# Patient Record
Sex: Female | Born: 1950
Health system: Southern US, Community
[De-identification: ages and names within clinical notes are randomized; demographics above are authoritative.]

## PROBLEM LIST (undated history)

## (undated) DIAGNOSIS — E039 Hypothyroidism, unspecified: Secondary | ICD-10-CM

## (undated) DIAGNOSIS — C569 Malignant neoplasm of unspecified ovary: Secondary | ICD-10-CM

## (undated) DIAGNOSIS — K449 Diaphragmatic hernia without obstruction or gangrene: Secondary | ICD-10-CM

## (undated) DIAGNOSIS — I10 Essential (primary) hypertension: Secondary | ICD-10-CM

## (undated) DIAGNOSIS — E119 Type 2 diabetes mellitus without complications: Secondary | ICD-10-CM

## (undated) DIAGNOSIS — I2699 Other pulmonary embolism without acute cor pulmonale: Secondary | ICD-10-CM

## (undated) DIAGNOSIS — I214 Non-ST elevation (NSTEMI) myocardial infarction: Secondary | ICD-10-CM

## (undated) DIAGNOSIS — J449 Chronic obstructive pulmonary disease, unspecified: Secondary | ICD-10-CM

## (undated) DIAGNOSIS — E782 Mixed hyperlipidemia: Secondary | ICD-10-CM

## (undated) DIAGNOSIS — E663 Overweight: Secondary | ICD-10-CM

## (undated) DIAGNOSIS — I2510 Atherosclerotic heart disease of native coronary artery without angina pectoris: Secondary | ICD-10-CM

## (undated) HISTORY — PX: TONSILLECTOMY: SUR1361

## (undated) HISTORY — DX: Atherosclerotic heart disease of native coronary artery without angina pectoris: I25.10

## (undated) HISTORY — PX: CHOLECYSTECTOMY: SHX55

## (undated) HISTORY — DX: Essential (primary) hypertension: I10

## (undated) HISTORY — PX: OTHER SURGICAL HISTORY: SHX169

## (undated) HISTORY — PX: ABDOMINAL SURGERY: SHX537

## (undated) HISTORY — DX: Type 2 diabetes mellitus without complications: E11.9

## (undated) HISTORY — DX: Hypothyroidism, unspecified: E03.9

## (undated) HISTORY — PX: APPENDECTOMY: SHX54

## (undated) HISTORY — PX: BREAST LUMPECTOMY: SHX2

## (undated) HISTORY — DX: Mixed hyperlipidemia: E78.2

## (undated) HISTORY — DX: Non-ST elevation (NSTEMI) myocardial infarction: I21.4

---

## 1998-07-10 ENCOUNTER — Encounter (INDEPENDENT_AMBULATORY_CARE_PROVIDER_SITE_OTHER): Payer: Self-pay

## 1998-12-10 ENCOUNTER — Encounter (INDEPENDENT_AMBULATORY_CARE_PROVIDER_SITE_OTHER): Payer: Self-pay

## 1998-12-10 ENCOUNTER — Other Ambulatory Visit: Admission: RE | Admit: 1998-12-10 | Discharge: 1998-12-10 | Payer: Self-pay | Admitting: *Deleted

## 1999-02-07 ENCOUNTER — Encounter: Admission: RE | Admit: 1999-02-07 | Discharge: 1999-02-07 | Payer: Self-pay | Admitting: *Deleted

## 1999-02-07 ENCOUNTER — Other Ambulatory Visit: Admission: RE | Admit: 1999-02-07 | Discharge: 1999-02-07 | Payer: Self-pay | Admitting: *Deleted

## 1999-09-01 ENCOUNTER — Ambulatory Visit (HOSPITAL_COMMUNITY): Admission: RE | Admit: 1999-09-01 | Discharge: 1999-09-01 | Payer: Self-pay | Admitting: *Deleted

## 2000-02-17 ENCOUNTER — Other Ambulatory Visit: Admission: RE | Admit: 2000-02-17 | Discharge: 2000-02-17 | Payer: Self-pay | Admitting: *Deleted

## 2000-05-14 ENCOUNTER — Ambulatory Visit (HOSPITAL_COMMUNITY): Admission: RE | Admit: 2000-05-14 | Discharge: 2000-05-14 | Payer: Self-pay | Admitting: *Deleted

## 2001-03-07 ENCOUNTER — Other Ambulatory Visit: Admission: RE | Admit: 2001-03-07 | Discharge: 2001-03-07 | Payer: Self-pay | Admitting: *Deleted

## 2001-10-11 ENCOUNTER — Ambulatory Visit (HOSPITAL_COMMUNITY): Admission: RE | Admit: 2001-10-11 | Discharge: 2001-10-11 | Payer: Self-pay | Admitting: *Deleted

## 2007-12-24 ENCOUNTER — Emergency Department (HOSPITAL_COMMUNITY): Admission: EM | Admit: 2007-12-24 | Discharge: 2007-12-24 | Payer: Self-pay | Admitting: Emergency Medicine

## 2007-12-27 ENCOUNTER — Inpatient Hospital Stay (HOSPITAL_COMMUNITY): Admission: EM | Admit: 2007-12-27 | Discharge: 2008-01-12 | Payer: Self-pay | Admitting: Emergency Medicine

## 2007-12-27 HISTORY — PX: SMALL INTESTINE SURGERY: SHX150

## 2008-01-02 ENCOUNTER — Encounter (INDEPENDENT_AMBULATORY_CARE_PROVIDER_SITE_OTHER): Payer: Self-pay | Admitting: General Surgery

## 2008-01-06 ENCOUNTER — Ambulatory Visit: Payer: Self-pay | Admitting: Oncology

## 2008-01-25 ENCOUNTER — Inpatient Hospital Stay (HOSPITAL_COMMUNITY): Admission: AD | Admit: 2008-01-25 | Discharge: 2008-01-26 | Payer: Self-pay | Admitting: General Surgery

## 2008-04-14 ENCOUNTER — Emergency Department (HOSPITAL_COMMUNITY): Admission: EM | Admit: 2008-04-14 | Discharge: 2008-04-14 | Payer: Self-pay | Admitting: Emergency Medicine

## 2010-05-08 LAB — URINALYSIS, ROUTINE W REFLEX MICROSCOPIC
Bilirubin Urine: NEGATIVE
Glucose, UA: NEGATIVE mg/dL
Ketones, ur: NEGATIVE mg/dL
Nitrite: NEGATIVE
Protein, ur: 30 mg/dL — AB
Specific Gravity, Urine: >1.03 — ABNORMAL HIGH (ref 1.005–1.030)
Urobilinogen, UA: 0.2 mg/dL (ref 0.0–1.0)
pH: 5.5 (ref 5.0–8.0)

## 2010-05-08 LAB — URINE CULTURE: Colony Count: 75000

## 2010-05-08 LAB — URINE MICROSCOPIC-ADD ON

## 2010-05-08 LAB — GLUCOSE, CAPILLARY: Glucose-Capillary: 151 mg/dL — ABNORMAL HIGH (ref 70–99)

## 2010-06-10 NOTE — Group Therapy Note (Signed)
[name], [name]          ACCOUNT NO.:  [id number]   MEDICAL RECORD NO.:  [id number]          PATIENT TYPE:  INP   LOCATION:  IC04                          FACILITY:  [facility]   PHYSICIAN:  Osvaldo Shipper, MD     DATE OF BIRTH:  Dec 14, 1950   DATE OF PROCEDURE:  DATE OF DISCHARGE:                                 PROGRESS NOTE   SUBJECTIVE:  Patient complaining of abdominal pain, 5/10 intensity .  She says that she is still not passing any gas.  Denies any nausea or  vomiting.  No other complaint offered at this time.   OBJECTIVE:  Temperature 98.0, heart rate 100-120 sinus, blood pressure  90/42.  She was hypotensive overnight with the lowest pressures being  74/53, respiratory rate is 20, saturation 100%, I believe she is on 2  liters by nasal cannula.   INS AND OUTS:  She made about 530 mL of urine yesterday, she was  positive by 2250.  NG drainage was 900.  JP drain was 170.  She so far  since midnight has made only 10 mL of urine.  She is positive by 960 mm.   GENERAL EXAM:  This is a morbidly obese white female in no distress.  HEENT:  No pallor, no icterus, oral mucous membranes are moist, no  lesions are noted.  NECK:  Soft, supple.  LUNGS:  Good air entry bilaterally.  No wheezing, rales, rhonchi.  CARDIOVASCULAR:  S1, S2, slightly tachycardic, regular, no murmurs  appreciated.  ABDOMEN:  Obese, tender, she has got bruising bilaterally in the flanks.  Dressing covers majority of the abdomen with the JP drainage.  Bowel  sounds are present at this time.  Abdomen is tender.  LOWER EXTREMITIES:  Do not show any edema.   LABORATORY DATA:  White count is 23,000 with 79% neutrophils, more than  20% bands have been reported, hemoglobin is 10.7, platelet count is 289.  Sodium is 138, potassium is 5.1, chloride is 113, bicarb is 21, glucose  is 164, BUN is 17, creatinine 1.59, albumin is 1.7.  No imaging studies  have been done recently.   ASSESSMENT:  She is status post  surgical repair for small bowel obstruction secondary  to ventral hernia.   1. Hypotension, likely because of hypovolemia, also a component of      sepsis may also be present.  She is getting 150 mL of normal      saline.  She has received 1 unit of blood overnight.  We were      planning to check the CVP but her PICC line is not really a PICC,      it is actually a Midline and she does not have a central access so      CVP cannot be checked at this point.  We will request the IV team      to reposition or replace this Medline into a PICC line.  At that      time we will give her some more fluid boluses.  Her hemoglobin will      be checked during the course  of the day today.  2. Possible sepsis.  She is currently only on Flagyl.  I think      consideration should be given to initiating broad spectrum      antibiotics.  THE PATIENT, HOWEVER, IS ALLERGIC TO PENICILLIN.      Maybe we could put her on Primaxin.  I will discuss this issue with      Dr. Lovell Sheehan.  At the very least I am going to initiate      ciprofloxacin in this individual.  Blood cultures will be sent off      at this time.  3. Acute renal failure.  The patient's UA will be checked.  We will      give aggressive fluids and recheck her renal function tomorrow.  If      she remains oliguric we may have to obtain a nephrology      consultation  4. History of pulmonary embolus.  She was on Coumadin which has been      held for surgery.  She is on full dose Lovenox.  We will have to      watch her carefully for bleeding complications.  5. Type 2 diabetes.  She is currently on a sliding scale which will be      continued, Lantus may have to be considered if her blood sugars      stay high.  6. She has a history of hypertension and she is off her medications at      this time.  7. She is currently on normal saline at 150 mL.  She is n.p.o.  She is      a full code.  8. Anemia: Transfused one unit. Monitor closely. She does  have      bruising in her flanks but low suspiscion for retroperitoneal      bleeding at this time.   Continue ICU setting for now and we will continue to monitor closely and  discuss with the surgeon.      Osvaldo Shipper, MD  Electronically Signed     GK/MEDQ  D:  01/03/2008  T:  01/03/2008  Job:  [id number]

## 2010-06-10 NOTE — Op Note (Signed)
[name], [name]          ACCOUNT NO.:  [id number]   MEDICAL RECORD NO.:  [id number]         PATIENT TYPE:  PINP   LOCATION:  IC04                          FACILITY:  [facility]   PHYSICIAN:  Dalia Heading, M.D.  DATE OF BIRTH:  [birth year]   DATE OF PROCEDURE:  DATE OF DISCHARGE:                               OPERATIVE REPORT   PREOPERATIVE DIAGNOSES:  Bowel obstruction, incisional hernia.   POSTOPERATIVE DIAGNOSES:  Bowel obstruction, incisional hernia.   PROCEDURES:  Partial small-bowel resection, incisional herniorrhaphy  with mesh.   SURGEON:  Dalia Heading, MD   ANESTHESIA:  General endotracheal.   INDICATIONS:  The patient is a 60 year old morbidly obese white female  with multiple medical problems who presents with a bowel obstruction  secondary to an incisional hernia.  This was confirmed by CT scan.  The  patient now comes to the operating room for incisional herniorrhaphy  with mesh.  Risks and benefits of the procedure including bleeding,  infection, cardiopulmonary difficulties, the possibility of recurrence  of the hernia, and death due to anesthesia were fully explained to the  patient, gave informed consent.   PROCEDURE NOTE:  The patient was placed in the supine position.  After  induction of general endotracheal anesthesia, the abdomen was prepped  and draped using usual sterile technique with Betadine.  Surgical site  confirmation was performed.   A right paramedian incision was made at the level of the umbilicus.  This was taken down to the hernia sac.  The hernia sac was excised  without difficulty.  The patient was noted to have both distal small  bowel, the cecum, and ascending colon in the hernia sac.  A stricture of  the small bowel distally was the source of the bowel obstruction.  This  appeared chronic in nature.  It was elected to proceed with a partial  small bowel resection.  Approximately, 8 inches of small bowel was  excised without  difficulty.  There was minimal spillage of small bowel  contents.  A GIA stapler was placed proximally and distally around the  strictured area and fired.  The mesentery was divided using the  LigaSure.  A side-to-side small bowel anastomosis was then performed  using a GIA 70 stapler.  Enterotomy was closed using a TA stapler.  The  staple line was bolstered using 3-0 silk sutures.  Mesenteric defect was  closed using 3-0 silk sutures.  The terminal ileum as well as the colon  were then reduced into the defect.  Surprisingly, the defect measured  approximately 6-8 cm in its greatest diameter.  A 5 cm x 10-cm PROCEED  mesh was then parachuted along the underside of the abdominal wound.  Old Prolene stay sutures were used.  This was done circumferentially.  A  tension-free repair was performed.  In addition, gentamicin and normal  saline were instilled into this region to decrease the chance of  infection.  Operating room personnel changed their gloves prior to the  incisional herniorrhaphy with mesh.  The subcutaneous layer was then  irrigated with normal saline.  A #10 flat Jackson-Pratt drain was placed  into the subcutaneous tissue and brought out through an incision  inferior to the incision line.  It was secured in the skin level using a  3-0 nylon interrupted suture.  Subcutaneous layer was reapproximated  using a 2-0 Vicryl interrupted suture.  The skin was closed using  staples.  Betadine ointment and dry sterile dressing were applied.   All tape and needle counts were correct at the end of procedure.  The  patient was extubated in the operating room and went back to recovery  room in guarded, but stable condition.  She will be transferred to the  intensive care unit for further management and treatment.   COMPLICATIONS:  None.   SPECIMEN:  Partial small bowel, hernia sac.   BLOOD LOSS:  100 mL.   DRAINS:  Jackson-Pratt drain to subcutaneous tissue of wound.      Dalia Heading, M.D.  Electronically Signed     MAJ/MEDQ  D:  01/02/2008  T:  01/03/2008  Job:  [id number]   cc:   Kirk Ruths, M.D.  Fax: [phone]

## 2010-06-10 NOTE — Consult Note (Signed)
[name], [name]          ACCOUNT NO.:  [id number]   MEDICAL RECORD NO.:  [id number]          PATIENT TYPE:  INP   LOCATION:  A330                          FACILITY:  [facility]   PHYSICIAN:  Dalia Heading, M.D.  DATE OF BIRTH:  Jul 17, 1950   DATE OF CONSULTATION:  12/27/2007  DATE OF DISCHARGE:                                 CONSULTATION   REASON FOR CONSULTATION:  Bowel obstruction secondary to incisional  hernia.   HISTORY OF PRESENT ILLNESS:  The patient is a 60 year old morbidly obese  white female with multiple medical problems who presented to the  emergency room with several day history of worsening abdominal pain and  vomiting.  She states she has not had a bowel movement for the past 2  days.  She presented to the emergency room for further evaluation and  treatment.  A CT scan of the abdomen and pelvis was performed, which  revealed small and large bowel loops with in the ventral hernia to the  right of the umbilicus.  No thickening of the bowel wall or stranding  was noted.  Apparently, I have seen the patient in the past as an  outpatient for this hernia, but she was referred to [facility] for further evaluation and treatment.  She was told years ago at  [facility] that she needs to lose weight prior to any hernia repair.  She  has gained weight since that time.   PAST MEDICAL HISTORY:  Morbid obesity, hypertension, diabetes mellitus,  hiatal hernia, hyponatremia, history of ovarian cancer, pulmonary  embolus.   PAST SURGICAL HISTORY:  Exploratory laparotomy, cholecystectomy,  appendectomy, oophorectomy.   CURRENT MEDICATIONS:  Synthroid, Actos, verapamil, Coumadin,  triamterene/hydrochlorothiazide, lisinopril, Nexium, simvastatin.   ALLERGIES:  CODEINE, SULFA, PENICILLIN.   REVIEW OF SYSTEMS:  The patient does not drink or smoke.   PHYSICAL EXAMINATION:  GENERAL:  The patient is a morbidly obese white  female, in no acute distress.  VITAL SIGNS:   She is afebrile and her blood pressure is 100/50, pulse  72, respiration rate 22.  ABDOMEN:  Soft with a soft easily reducible hernia in the right  periumbilical region.  Bowel sounds are heard in this area.  No  hepatosplenomegaly, masses, or rigidity are noted.   LABORATORY DATA:  MET-7 is remarkable for sodium of 124, potassium 3.7,  chloride 82, carbon dioxide 27, glucose 163, BUN 76, creatinine 2.02.  Liver enzyme tests are within normal limits.  White blood cell count  15.4, hematocrit 42.9, platelet count 306.  Her INR is 4.5.   IMPRESSION:  Incisional hernia, which has caused a bowel obstructive  symptoms.  I do not believe it is incarcerated and a surgical  intervention cannot be performed at this time due to her renal  insufficiency, hyponatremia, and INR of 4.5.   PLAN:  Nasogastric tube decompression will be needed.  She will also  need to have all her metabolic abnormalities corrected prior to any  surgical intervention.  She realizes that any surgical intervention is  very risky due to her multiple medical problems including her morbid  obesity.  Further surgical management will be decided as her  abnormalities are corrected.  I will follow the patient with you.  The  patient will be admitted by the hospitalist was for further treatment.      Dalia Heading, M.D.  Electronically Signed     MAJ/MEDQ  D:  12/27/2007  T:  12/28/2007  Job:  [id number]   cc:   Kirk Ruths, M.D.  Fax: [phone]

## 2010-06-10 NOTE — Discharge Summary (Signed)
[name], [name]          ACCOUNT NO.:  [id number]   MEDICAL RECORD NO.:  [id number]          PATIENT TYPE:  INP   LOCATION:  A306                          FACILITY:  [facility]   PHYSICIAN:  Dorris Singh, DO    DATE OF BIRTH:  Mar 20, 1950   DATE OF ADMISSION:  12/27/2007  DATE OF DISCHARGE:  LH                               DISCHARGE SUMMARY   PROBABLE DATE OF DISCHARGE:  January 09, 2008.   ADMISSION DIAGNOSES:  1. Abdominal pain.  2. Small bowel obstruction.  3. Increasing INR.  4. Acute renal failure.  5. Hyponatremia.  6. Morbid obesity.  7. Insulin dependent diabetes.  8. Hypertension.   DISCHARGE DIAGNOSES:  1. Small bowel obstruction secondary to incisional hernia  2. Severe anemia.  3. Acute renal failure.  4. History of pulmonary embolism.  5. Coagulopathy.  6. Type 2 diabetes.  7. Morbid obesity.   CONSULTS THAT WERE MADE:  1. Dalia Heading, M.D. of Surgery.  2. Ladona Horns. Mariel Sleet, M.D. of Hematology/Oncology.  3. Physical therapy.  4. Diabetes management.  5. Social services.   TESTING THAT WAS DONE:  Radiology testing includes:  On December 1 she had a CT of the abdomen without contrast, which  demonstrated distal small bowel proximal colonic obstruction secondary  to large right umbilical hernia.  No evidence for bowel perforation or a  pelvic abscess.  1. December 2 she had a chest x-ray which showed malposition right      PICC line extending into right internal jugular vein, advanced      enteric tip of the tube which is still in the distal thoracic      esophagus.  Greater left than right bibasilar atelectasis.  2. On the 8th she had a chest x-ray which showed cardiomegaly with      bibasilar atelectasis and pulmonary hypoinflation.   HISTORY OF PRESENT ILLNESS:  For H and P please refer to her H and P  regarding that.   HOSPITAL COURSE:  1. For her above diagnosis of small bowel obstruction.  The patient      was admitted.  An NG tube  was placed.  However, this did not      relieve her symptoms.  It was determined that she probably had      incarcerated bowel from an incisional hernia and she therefore had      a hernia repair.  The patient was placed in the ICU and a J tube      was inserted.  She continued to do well.  Her incision healed      nicely without any complications from the surgery.  2. Anemia, requiring blood transfusion.  While the patient was here      she had severe anemia and required four units of blood.  Did not      find a source of her bleeding.  There was some concern as to      whether or not she had a coagulopathy.  Also the patient has had to      be placed on anticoagulation therapy due to history of  pulmonary      embolus.  At this point in time it was determined that Dr.      Mariel Sleet could see the patient regarding a possible coagulopathy.      He also determined as soon as it was safe to place the patient on      anticoagulation, that she needed to be on it as well.  While the      patient was hospitalized she also received a PICC line.  When she      first arrived, she did have acute renal failure.  She was given IV      hydration and this resolved.  Also she has type 2 diabetes.  She was placed on a sliding scale and also diabetes management saw her  for any recommendations regarding treatment.  1. Morbid obesity.  The patient was seen by physical therapy to help      with ambulation and the patient has done well.   Her medications that she will be sent to the rehab facility on include:  1. Sliding scale insulin per their protocol.  2. Beneprotein powder 41 p.o. t.i.d.  3. It is recommended that she stay on Lovenox.  4. Also the addition of Coumadin.  Currently she is on 7.5 of Coumadin      and Lovenox 120.  However, this can probably be increased to reach      a therapeutic level.  5. Protonix 40 mg p.o. daily.  6. Nu-Iron 150 mg p.o. daily.  7. Nystatin 5 mL swish and swallow  every 6 hours.  8. Levothyroxine 100 mcg 1 p.o. daily.  9. Magic mouth wash 5 mL q.i.d. swish and swallow.  Do not give within      2 hours of Cipro or Avelox.  10.Zofran 4 mg IV p.o. every 8 hours p.r.n.  11.Albuterol nebulizer 2.5 mg every 4 hours p.r.n.  12.Tylenol 650 mg p.o. PR every 4 hours p.r.n.  13.Fentanyl citrate 50 mcg IV every 4 hours for pain.  14.Darvocet N 100 one-two tablets p.o. every 4 hours p.r.n. pain.  15.Nystatin cream.  Apply to perineum as directed.  16.Acyclovir ointment.  Apply 6 times a day for 7 days.  17.Chloraseptic spray 2 sprays every 1 hour p.r.n.   It is recommended that the patient's weight be monitored within 5-7 days  after admission to the rehabilitation facility and have them adjust her  Coumadin and Lovenox.  Also she should have daily INRs done until she is  considered to be therapeutic again and a CBC every 3 days to monitor her  blood level until she is therapeutic and is stable.   DISCHARGE ACTIVITIES:  Her condition to go to the he nursing home is  stable.   DISPOSITION:  Will be to a rehabilitation facility as soon as we get a  bed.      Dorris Singh, DO  Electronically Signed     CB/MEDQ  D:  01/10/2008  T:  01/10/2008  Job:  [id number]

## 2010-06-10 NOTE — Group Therapy Note (Signed)
NAMEMARDA, BREIDENBACH          ACCOUNT NO.:  [id number]   MEDICAL RECORD NO.:  [id number]          PATIENT TYPE:  INP   LOCATION:  A306                          FACILITY:  [facility]   PHYSICIAN:  Dorris Singh, DO    DATE OF BIRTH:  Nov 04, 1950   DATE OF PROCEDURE:  01/09/2008  DATE OF DISCHARGE:                                 PROGRESS NOTE   The patient seen today resting in bed comfortably, has no complaints.  Currently we are awaiting for rehab placement.  She is request the [facility], however it has been discussed with her that it depends on where  her insurance will allow her to go and where they can find a bed.   VITALS:  For today are as follows temperature 98, pulse 97, respirations  18, blood pressure 138/68.  GENERAL:  The patient is a morbidly obese Caucasian female who is well-  developed and is in no acute distress.  HEART:  Regular rate and rhythm though breath sounds are distant.  LUNGS:  Clear to auscultation bilaterally.  ABDOMEN:  Soft, nontender, nondistended with appropriate tenderness  around incision site, is large and pendulous.  EXTREMITIES:  Positive  pulses.   LABS:  For today white count 9.1, hemoglobin 9.7, hematocrit 29.6,  platelet count of 278.   ASSESSMENT:  1. Status post small bowel obstruction with umbilical hernia repair.  2. Acute blood loss anemia.  3. History of pulmonary emboli.  4. Anticoagulation therapy due to #3.  5. Morbid obesity.  6. Acute renal failure which is resolved.  7. Type 2 diabetes, we should keep her on her sliding scale.   PLAN:  We are waiting for patient to have placement.  She has recovered  well from her surgery.  She has had her J-tube pulled out.  For her  blood loss anemia we are continuing to monitor this, she received 1 unit  last night and will continue to monitor for any changes.  For her  diabetes we are continuing to monitor that as well.  She also has Herpes  labialis, gave her some ointment for  that which seems to have improved,  we will continue to monitor that. The plan is to have patient possibly  discharged to rehab facility within the next 24 - 72 hours.      Dorris Singh, DO  Electronically Signed     CB/MEDQ  D:  01/09/2008  T:  01/09/2008  Job:  [id number]

## 2010-06-10 NOTE — H&P (Signed)
[name], [name]          ACCOUNT NO.:  [id number]   MEDICAL RECORD NO.:  [id number]          PATIENT TYPE:  INP   LOCATION:  A330                          FACILITY:  [facility]   PHYSICIAN:  Dorris Singh, DO    DATE OF BIRTH:  [birth year]   DATE OF ADMISSION:  12/27/2007  DATE OF DISCHARGE:  LH                              HISTORY & PHYSICAL   The patient is a 60 year old Caucasian female who presented to the [facility] emergency room complaining of abdominal pain.  Her primary care  physician is Dr. Regino Schultze.  She states that it started about 1 week ago.  She has not had a bowel movement and has not been able to eat well.  She  has been reduced to eating Jello.  She says the pain waxes and wanes.  She says she feels like she has to have a bowel movement but cannot have  one.  She also has a right chronic abdominal wall hernia and over the  last 2 days has noticed that she has not been able to keep anything down  and is actually vomiting what she is eating.  She denies fever,  shortness of breath or chest pain.  She is able to urinate.  She said  the pain was gradual and it was worsening.  It was located in her  abdomen which radiated to the right side, characterized as crampy and  dull.  It was aggravated by eating and it was relieved by nothing, and  it has been associated with constipation, nausea and vomiting.   PAST MEDICAL HISTORY:  1. Morbid obesity.  2. Hypertension.  3. Diabetes.  4. GERD.  5. Hiatal hernia.  6. Hypercholesterolemia.  7. Hyponatremia.  8. Ovarian cancer.  9. Pulmonary emboli.   PAST SURGICAL HISTORY:  1. Appendectomy.  2. Cesarean section.  3. Cholecystectomy.  4. Oophorectomy.   SOCIAL HISTORY:  She is a nonsmoker, nondrinker.  No drug abuse.  Lives  with spouse.  She is currently going through menopause and she has  impaired mobility.   ALLERGIES:  1. CODEINE.  2. SULFA.  3. PENICILLIN.   HOME MEDICATIONS:  1. Synthroid 100 mcg  once a day.  2. Actos 45 mg once a day.  3. Verapamil 240 mg once a day.  4. Coumadin 5 mg, specialized dosing.  5. Triamterene/hydrochlorothiazide 50/25 once a day.  6. Lisinopril 20 mg once a day.  7. Nexium 40 mg once a day.  8. Simvastatin 40 mg once a day.   REVIEW OF SYSTEMS:  CONSTITUTIONAL:  The patient denies weight loss.  Positive changes in appetite.  Negative for fever or dizziness.  EYES:  Negative for changes in vision.  EARS/NOSE/MOUTH/THROAT:  Ears negative  for ear pain.  No changes in hearing, sore throat or rhinorrhea.  CARDIOVASCULAR:  Negative chest pain or palpitations.  RESPIRATORY:  Negative for cough, dyspnea or wheezing.  GASTROINTESTINAL:  Positive  for vomiting, abdominal pain and constipation.  Negative for diarrhea or  blood in stool.  GU:  Negative for dysuria and hematuria.  MUSCULOSKELETAL:  Negative for arthralgias, back pain,  myalgias and neck  pain.  SKIN:  Negative for rash or abrasions.  NEURO:  Negative for  headache, altered mental status or weakness.  PSYCHIATRIC:  Negative for  depression or anxiety.   PHYSICAL EXAMINATION:  VITAL SIGNS:  Blood pressure 91/50, pulse rate  72, respirations 22, temperature 97.8.  Satting at 100.  GENERAL:  The  patient is a morbidly obese Caucasian female who is in no acute  distress.  HEENT:  Head is normocephalic, atraumatic.  Eyes; PERRLA.  EOMI.  There  is no scleral icterus or conjunctival injection.  Ears; TMs visualized  bilaterally.  Nose; turbinates are moist.  Throat; there is no erythema  or exudate noted.  Teeth are in fair repair.  NECK:  Supple.  There is no lymphadenopathy noted.  HEART:  Distant heart sounds.  Regular rate and rhythm.  LUNGS:  Clear to auscultation bilaterally.  No rales, wheezes or  rhonchi.  Hard to hear posterior side of chest due to the patient's  immobility.  ABDOMEN:  Soft with some right upper quadrant tenderness.  Abdomen is  pendulous as well.  There is a right  abdominal wall hernia which is  reducible at the time my exam.  EXTREMITIES:  Positive pulses.  No ecchymosis, edema or cyanosis.  SKIN:  Good turgor, good texture.   Urine testing that was done, urine is negative.  CT pelvis without  contrast media shows distal small bowel proximal colonic obstruction  secondary to probable incarcerated large right paraumbilical hernia.  No  evidence of bowel perforation or intra-abdominal abscesses.  The solid  parenchymal organs appeared unremarkable as imaged in the noncontrast  state.  Pelvis; there is no evidence of bowel perforation or pelvic  abscess and basically the distal small bowel proximal colonic  obstruction secondary to large right umbilical hernia.  Her lipase was  52.  Sodium is 124, potassium 3.7, chloride 82, carbon dioxide 27,  glucose 163, BUN 78, creatinine is 2.02 and BUN of 76.  Her INR is 4.5.  Her white count of 15.4, hemoglobin is 14.3, hematocrit 42.9, platelet  count of 306.   ASSESSMENT/PLAN:  1. Abdominal pain.  2. Small bowel obstruction.  3. Elevated increasing international normalized ratio which is      supratherapeutic.  4. Acute renal failure.  5. Hyponatremia.  6. Morbid obesity.  7. Diabetes.  8. Hypertension.   Plan will be to admit the patient to the service of [facility].  She had  a Foley placed in the ED.  Will do strict Is and Os.  Will get blood  work.  Due to the patient's morbidly obese body habitus will probably  order a PICC line for her.  Currently, Dr. Lovell Sheehan has seen the patient  in the ED and has put an NG tube down.  Will continue with a  intermittent suction.  To address all of her other morbidities, will  place her on IV antibiotics to address her white count which will  include Flagyl and Cipro.  Also will place her on IV antihypertensives.  Will do Lopressor 5 mg q.4 h., for systolic above 160.  Also for  hyponatremia, she with get some fluids.  Will see if that corrects and  it  should improve her acute renal failure as well.  Will continue to  monitor the patient.  Will do DVT and GI prophylaxis which I will have  pharmacy to monitor and also place her on a hypokalemic scale.  Will  continue to monitor her progress and change therapy as necessary.      Dorris Singh, DO  Electronically Signed     CB/MEDQ  D:  12/28/2007  T:  12/28/2007  Job:  [id number]   cc:   Kirk Ruths, M.D.  Fax: [phone]

## 2010-06-10 NOTE — Op Note (Signed)
NAMEDESSIRE, GRIMES          ACCOUNT NO.:  [id number]   MEDICAL RECORD NO.:  [id number]          PATIENT TYPE:  INP   LOCATION:  A340                          FACILITY:  [facility]   PHYSICIAN:  Dalia Heading, M.D.  DATE OF BIRTH:  11/08/1950   DATE OF PROCEDURE:  01/26/2008  DATE OF DISCHARGE:  01/26/2008                               OPERATIVE REPORT   PREOPERATIVE DIAGNOSIS:  Abdominal wound dehiscence.   POSTOPERATIVE DIAGNOSIS:  Abdominal wound dehiscence.   PROCEDURE:  Debridement and closure of abdominal wound.   SURGEON:  Dalia Heading, MD   ASSISTANT:  Tilford Pillar, MD   ANESTHESIA:  General endotracheal.   INDICATIONS:  The patient is a morbidly obese white female with multiple  medical problems, status post partial small-bowel resection, incisional  herniorrhaphy with mesh on January 02, 2008, who presents with a wound  dehiscence.  She presents from [facility] for wound debridement  and a wound VAC placement.  The risks and benefits of the procedure were  fully explained to the patient, gave informed consent.   PROCEDURE NOTE:  The patient was placed in the supine position.  After  induction of general endotracheal anesthesia, the abdomen was prepped  and draped using the usual sterile technique with Betadine.  Surgical  site confirmation was performed.   The right paramedian incision has had some serosanguineous drainage.  The remaining staples were removed.  The patient was noted to have  multiple clots of blood present in the subcutaneous tissue.  The  subcutaneous tissue was very thick.  The clots were removed.  The mesh  repair was noted to be granulated over and intact.  A pulse lavage was  used to clean the subcutaneous tissue.  The wound was irrigated with  gentamicin and normal saline.  The wound VAC was then applied.  Three  pieces of sponge were placed into the wound.   The patient tolerated the procedure well and was transferred back  to  PACU in stable condition.   COMPLICATIONS:  None.   SPECIMEN:  None.   BLOOD LOSS:  None.      Dalia Heading, M.D.  Electronically Signed     MAJ/MEDQ  D:  01/26/2008  T:  01/27/2008  Job:  [id number]   cc:   Kirk Ruths, M.D.  Fax: [phone]   [facility]  [place]

## 2010-06-10 NOTE — H&P (Signed)
NAMEELANA, JIAN          ACCOUNT NO.:  [id number]   MEDICAL RECORD NO.:  [id number]          PATIENT TYPE:  INP   LOCATION:  A340                          FACILITY:  [facility]   PHYSICIAN:  Dalia Heading, M.D.  DATE OF BIRTH:  10-25-1950   DATE OF ADMISSION:  01/25/2008  DATE OF DISCHARGE:  LH                              HISTORY & PHYSICAL   CHIEF COMPLAINT:  Wound dehiscence.   HISTORY OF PRESENT ILLNESS:  The patient is a 60 year old morbidly obese  white female status post incisional herniorrhaphy with mesh on January 02, 2008, who presents from [facility] with a dehiscence of her  wound.  She is being admitted to hospital for care of her wound  dehiscence.  She denies any fever or chills.   PAST MEDICAL HISTORY:  Morbid obesity, hypertension, diabetes mellitus,  history of pulmonary embolus, history of ovarian cancer, hiatal hernia,  GERD, diabetes mellitus.   PAST SURGICAL HISTORY:  As noted above, appendectomy, C-section,  cholecystectomy, oophorectomy.   CURRENT MEDICATIONS:  1. Synthroid 100 mcg p.o. daily.  2. Coumadin 1 tablet p.o. daily.  3. Lisinopril 20 mg p.o. daily.  4. Protonix 40 mg p.o. daily.  5. Simvastatin 40 mg p.o. daily.  6. Nu-Iron 150 mg p.o. daily.  7. Nystatin swish and swallow 5 mL q.i.d.  8. Fentanyl patch 25 mcg daily.  9. Zofran.  10.Benicar 40 mg p.o. daily.  11.Albuterol nebulizer.   ALLERGIES:  CODEINE, SULFA, and PENICILLIN.   REVIEW OF SYSTEMS:  Noncontributory.   PHYSICAL EXAMINATION:  GENERAL:  The patient is a morbidly obese white  female in no acute distress.  LUNGS:  Clear to auscultation with equal breath sounds bilaterally.  HEART:  Regular rate and rhythm without S3, S4, murmurs.  ABDOMEN:  Soft.  A right paramedian incision is noted with some staples  still in place.  There is sanguineous fluid draining from it.  No  purulent drainage noted.  The wound edges are necrotic and beginning to  dehisce.   IMPRESSION:  Abdominal wound dehiscence.   PLAN:  The patient is being transferred from [facility] for care of her  wound.  She subsequently will undergo operative wound exploration and  debridement along with a wound VAC placement.  The risks and benefits of  the procedure were fully explained to the patient, gave informed  consent.      Dalia Heading, M.D.  Electronically Signed     MAJ/MEDQ  D:  01/25/2008  T:  01/26/2008  Job:  [id number]   cc:   Tesfaye D. Felecia Shelling, MD  Fax: [phone]

## 2010-06-10 NOTE — Consult Note (Signed)
[name], [name]          ACCOUNT NO.:  [id number]   MEDICAL RECORD NO.:  [id number]          PATIENT TYPE:  INP   LOCATION:  A306                          FACILITY:  [facility]   PHYSICIAN:  Ladona Horns. Neijstrom, MD  DATE OF BIRTH:  04-03-1950   DATE OF CONSULTATION:  DATE OF DISCHARGE:                                 CONSULTATION   DIAGNOSES:  1. Bowel obstruction with incarceration and incisional hernia.  2. Pulmonary emboli x3 associated with deep venous thrombosis at least      on 1 occasion, 2 episodes in 1970s, 1 episode in late 1980s.  3. History of ovarian cancer though she states she did not receive      chemotherapy and still has one ovary in her uterus.  There is no      evidence on the CT of the abdomen and pelvis done during this      hospitalization showing recurrent disease, interestingly.  4. History of hyponatremia.  5. Diabetes mellitus.  6. Morbid obesity.  7. History of hiatal hernia disease.  8. History of hypertension.  9. History of appendectomy and cholecystectomy in the past with      exploratory laparotomy by Dr. Lovell Sheehan at the time of her ovarian      cancer surgery, and it sounds like mid 1990s.  10.Hypothyroidism on Synthroid replacement.  11.Hypercholesterolemia on simvastatin.   ALLERGIES:  CODEINE, SULFONAMIDES, and PENICILLIN.   HISTORY:  This is a pleasant 60 year old morbidly obese white lady who  was admitted with the above diagnosis of bowel obstruction status post  surgical decompression with mesh placement by Dr. Lovell Sheehan on January 02, 2008.   She is actually recuperating fairly well at this time.   She had had some abdominal pain off and on for about a week prior to  this admission.  The pain waxing and waning and then was admitted on the  above-mentioned date with the above findings of bowel obstruction.   I was just asked to see her in consultation about her history of  pulmonary emboli, whether she needs still lifelong  anticoagulation.  The  patient states that her maternal grandmother also had chronic DVTs of  one leg in particular and was on Coumadin as well until she died of  congestive heart failure.   There is no other family history of ovarian cancer, breast cancer, or  DVTs.   She and her husband had 3 sons, one son died in infancy.  He was  premature.  The other 2 sons are in good health.   She states that she has a sister who was born with mental retardation  and is in a nursing home with Parkinson disease and one other sister is  still living.  One brother was killed in his early 76s while in the  [employer], she states.   She and her husband have been married many years.  He supposedly has a  history of prostate cancer.   She is not a drinker, not a smoker.   Her social has been as mentioned.  She has not been able to work for  many many years.   Her labs upon this admission showed a hemoglobin A1c of 5.9, a normal  hemoglobin on admission, and an elevated white count with a slight left  shift and normal platelets on admission.  CMET showed a BUN of 76,  creatinine of 2.02 consistent with prerenal azotemia.  She had an SGPT  of only 59.  Her albumin was slightly low at 3.4, total protein 6.7,  calcium was normal.   Her TSH on December 28, 2007, was 1.755 well within the normal range.  Her PTT on admission was 52.  Her PT was 57.5 with an INR of 5.7.  She  states that she had been well controlled, however, in the past prior to  this acute illness.   She is not to her knowledge of being aware of ever seeing a hematologist  or have an hypercoagulable workup in the past.   Her physical exam of course shows a massively overweight individual  whose height is recorded as 61 inches, her weight is 164.8 kg.  Today,  she appears to be afebrile.  Her blood pressure is 136/80, respirations  18-22 and somewhat shallow, pulse right around 110-120 and regular,  heart shows no S3, gallop, or  murmur.  Lungs are clear anteriorly.  She  has no obvious breast masses and she has not had mammography in years.  She did not breastfeed her children.  Her abdomen is massively over  overweight and the incision looks fine.  She has a little puffiness at  the ankles and I was not sure I could feel pulses absolutely in her  feet.  She was alert and oriented.  She was very pale in appearance and  her hemoglobin actually today was down from a normal admission  hemoglobin to 8.5.  Admission hemoglobin may have been hemoconcentrated  because of the dehydration potentially, and she has had one at least  Hemoccult positive stool on January 05, 2008.  The good news is that  her BUN and creatinine are well within the normal range at this time.  BUN of 9, creatinine 0.68.   I think for this lady, it would be nice to have a hypercoagulable panel  to see if there is any evidence for hereditary factors, but regardless  of the outcome of that test, she is a lady with 3 pulmonary embolic  episodes and does indeed need lifelong Coumadin or low molecular weight  heparin therapy.  She has basically been bedridden by her husband's  account for the last year since her weight has become more and more  difficult to control.  So, I will get the panel, but I am not sure it  will be very helpful but it may just elucidate if there is a hereditary  factor, but her therapy going forward still needs to be lifelong vitamin  K antagonistic therapy or low molecular weight heparin.      Ladona Horns. Mariel Sleet, MD  Electronically Signed     ESN/MEDQ  D:  01/06/2008  T:  01/07/2008  Job:  [id number]   cc:   Kirk Ruths, M.D.  Fax: [phone]

## 2010-06-10 NOTE — Group Therapy Note (Signed)
NAMEKATHALENE, [name]          ACCOUNT NO.:  [id number]   MEDICAL RECORD NO.:  [id number]          PATIENT TYPE:  INP   LOCATION:  IC04                          FACILITY:  [facility]   PHYSICIAN:  Dorris Singh, DO    DATE OF BIRTH:  December 08, 1950   DATE OF PROCEDURE:  DATE OF DISCHARGE:                                 PROGRESS NOTE   Patient. Seen today resting comfortably in bed.  Discussed with her at  length regarding her previous PE history.  Patient stated that in the  70s she had gotten a PE twice and then her last PE was in 1987/1988.  She was told back at that time she would need to be on anticoagulation  therapy.  Has not had any PE since that time and has remained on  anticoagulation therapy.  Discussed with her the concern that we were  having regarding her current anemia which is suspicious for blood loss  status post surgery and that we would need to consider discontinuing her  Lovenox for a short period of time to determine if she could maintain  her hemoglobin and hematocrit.  Patient did understand and agreed to  this plan of care.  The explained to her to that even though she has not  had PE in several decades that we may need to still do some type of DVT  prophylaxis which would include SCDs and we will monitor her H and H  over the 12 hours.  Patient stated understanding and agreement to this  and understands the risk due to her previous history of possibly PE.  Dicussed benefit versus risk is what we are going to try to achieve.   Her vital signs are as follows:  Heart rate 112, respirations 26, and  blood pressure 155/56.  GENERAL:  Patient is a 60 year old Caucasian female who is morbid obese  who is alert and oriented, answers questions appropriately.  HEART:  Regular rate and rhythm.  LUNGS:  Clear to auscultation.  ABDOMEN:  Soft, nontender, nondistended.  Pendulous with healing status  post laparotomy incision as well as multiple areas of ecchymosis.  EXTREMITIES:  Positive pulses and there is Foley placement.   LABORATORIES:  For today, white count of 18.3, hemoglobin 9.4,  hematocrit 28.1, platelet count of 201, INR is 1.8, sodium is 141,  potassium 4.1, chloride 110, CO2 is 23, glucose 128, BUN 21, creatinine  1.21.  Her AST is 320 and her ALT is 251.   ASSESSMENT AND PLAN:  1. We are looking at possible acute blood loss anemia.  Patient      received 2 units of blood last night and will continue to monitor      hemoglobin and hematocrit and B12.  Also will hold her Lovenox      therapy for 24 hours and place her on sequential compression      devices.  As mentioned above discussed plan of care with patient.  2. Hypotension:  Has been resolved and will continue to monitor.  3. Possible sepsis:  Patient has been placed on Flagyl and Cipro and  her white count is continuing to go down.  She does have an allergy      to PENICILLIN.  4. Acute renal failure:  This looks like it is improving.  We will      continue to monitor her elevated liver enzymes.  We will continue      to monitor this and if it continues to be elevated, we may have      consult gastrointestinal.  5. Type 2 diabetes:  We will keep her on sliding scale.  6. History of pulmonary embolus as mentioned before.  We will hold her      Lovenox and place serial compressive      devices on her.  7. Also the plan is today if patient's hemoglobin and hematocrit is      any lower than earlier this morning, we will go ahead and plan on      getting a CT without contrast and we will continue to monitor her.      Dorris Singh, DO  Electronically Signed     CB/MEDQ  D:  01/04/2008  T:  01/04/2008  Job:  [id number]

## 2010-06-10 NOTE — Discharge Summary (Signed)
[name], [name]          ACCOUNT NO.:  [id number]   MEDICAL RECORD NO.:  [id number]          PATIENT TYPE:  INP   LOCATION:  A306                          FACILITY:  [facility]   PHYSICIAN:  Lonia Blood, M.D.      DATE OF BIRTH:  1950-05-18   DATE OF ADMISSION:  12/27/2007  DATE OF DISCHARGE:  12/16/2009LH                               DISCHARGE SUMMARY   ADDENDUM:  For discharge diagnosis, etc. please see discharge summary by  Dr. Dorris Singh on January 09, 2008.  The patient is going to rehab  facility.  This is an update on her discharge medications which include:   DISCHARGE MEDICATIONS:  1. Synthroid 100 mcg daily.  2. Coumadin currently 7.5 mg.  3. Lisinopril 20 mg daily.  4. Protonix 40 mg daily.  5. Simvastatin 40 mg daily.  6. NuIron 150 mg daily.  7. Nystatin swish and swallow 5 mL q.6 hours.  8. Fentanyl patch 25 mcg daily.  9. Zofran 4 mg p.o. q.8 hours p.r.n. for nausea.  10.Benicar 40 mg t.i.d.  11.Acyclovir ointment apply 6 times a day for 5 more days.  12.Albuterol nebulizer 2.5 mL q.4 hours p.r.n. for breathing problems.   Otherwise, the rest of her discharge summary is as previously dictated  by Dr. Dorris Singh.      Lonia Blood, M.D.  Electronically Signed     LG/MEDQ  D:  01/11/2008  T:  01/11/2008  Job:  [id number]

## 2010-06-13 NOTE — Op Note (Signed)
[facility]  Patient:    [name], [name]                 MRN: [id number] Proc. Date: 09/01/99 Adm. 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  POSTOPERATIVE DIAGNOSIS:  Postmenopausal bleeding and history of granulosa cell tumor of the ovary, Coumadin for multiple pulmonary emboli, and morbid obesity.  PROCEDURE:  Exam under anesthesia, dilatation of the cervix with paracervical block, and hysteroscopy.  ANESTHESIA:  MAC plus 0.25% Marcaine with epinephrine cervical block and hysteroscopy.  SURGEON:  Pershing Cox, M.D.  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  She has been followed because of this history and her history of morbid obesity.  She is on Coumadin for a history of multiple pulmonary emboli.  Endometrial biopsy in November showed disordered endometrium.  We tried to evaluate her postmenopausal bleeding by transvaginal sonogram because of her morbid obesity and the position of the uterus, the cavity could not be adequately visualized.  For this reason, the decision was made to bring her to the operating room today to take a look at her endometrial cavity.  She is on Coumadin and has an INR of 2.4.  For this reason, the decision was made not to do anything invasive regarding resection or curettage, but simply to look at the endometrial cavity if an abnormality was found.  She would return on another day for reversal of her Coumadin and resection procedure.  OPERATIVE  FINDINGS:  Uterine cavity sounded to 9 cm.  The cavity contained a very small clot adherent to the right wall which was dislodged during the irrigation procedure.  The cavity was smooth and there were no filling defects.  There was no evidence of abnormal endometrium.  Both ostia were completely visualized.  DESCRIPTION OF PROCEDURE:  The patient was brought to the operating room with an IV in place.  She had received 1 g of Ancef in the holding area.  The patient was draped for a vaginal procedure.  A large bivalve Graves speculum brought from the office was introduced into the vagina and the cervix was visualized.  0.25% Marcaine with epinephrine was injected into the anterior cervix which was then grasped with a single tooth tenaculum.  Ten cubic centimeters of the Marcaine solution were injected into the 3, 4, 7, and 8 position in the cervix to perform a paracervical block.  Endocervical curettage was not performed.  The stone passed easily into the endometrial cavity to a depth of 9 cm.  The cervix was then dilated with serial Pratt dilators to size 25 and the diagnostic scope was introduced.  The catheter was irrigated with through-and-through sorbitol irrigation.  Multiple photographs were made of the cavity to document its smooth contour and the absence of abnormal endometrium.  A curettage was not performed because of her elevated INR and Coumadin.  The procedure was terminated.  The tenaculum was removed and care was taken to be sure that there was no bleeding from the anterior cervix.  The patient  was taken to the recovery room in excellent condition. DD:  09/01/99 TD:  09/01/99 Job: [id number]

## 2010-08-08 ENCOUNTER — Emergency Department (HOSPITAL_COMMUNITY): Payer: 59

## 2010-08-08 ENCOUNTER — Encounter: Payer: Self-pay | Admitting: *Deleted

## 2010-08-08 ENCOUNTER — Inpatient Hospital Stay (HOSPITAL_COMMUNITY)
Admission: EM | Admit: 2010-08-08 | Discharge: 2010-08-11 | DRG: 203 | Disposition: A | Discharge status: Home Health | Payer: 59 | Attending: Internal Medicine | Admitting: Internal Medicine

## 2010-08-08 ENCOUNTER — Other Ambulatory Visit: Payer: Self-pay

## 2010-08-08 DIAGNOSIS — Z86718 Personal history of other venous thrombosis and embolism: Secondary | ICD-10-CM

## 2010-08-08 DIAGNOSIS — E119 Type 2 diabetes mellitus without complications: Secondary | ICD-10-CM | POA: Diagnosis present

## 2010-08-08 DIAGNOSIS — J45909 Unspecified asthma, uncomplicated: Secondary | ICD-10-CM

## 2010-08-08 DIAGNOSIS — Z8543 Personal history of malignant neoplasm of ovary: Secondary | ICD-10-CM

## 2010-08-08 DIAGNOSIS — E782 Mixed hyperlipidemia: Secondary | ICD-10-CM | POA: Insufficient documentation

## 2010-08-08 DIAGNOSIS — J209 Acute bronchitis, unspecified: Secondary | ICD-10-CM | POA: Diagnosis present

## 2010-08-08 DIAGNOSIS — J40 Bronchitis, not specified as acute or chronic: Principal | ICD-10-CM | POA: Diagnosis present

## 2010-08-08 DIAGNOSIS — I10 Essential (primary) hypertension: Secondary | ICD-10-CM | POA: Diagnosis present

## 2010-08-08 DIAGNOSIS — E039 Hypothyroidism, unspecified: Secondary | ICD-10-CM | POA: Diagnosis present

## 2010-08-08 DIAGNOSIS — IMO0001 Reserved for inherently not codable concepts without codable children: Secondary | ICD-10-CM | POA: Diagnosis present

## 2010-08-08 DIAGNOSIS — I2699 Other pulmonary embolism without acute cor pulmonale: Secondary | ICD-10-CM | POA: Insufficient documentation

## 2010-08-08 DIAGNOSIS — R0602 Shortness of breath: Secondary | ICD-10-CM

## 2010-08-08 HISTORY — DX: Other pulmonary embolism without acute cor pulmonale: I26.99

## 2010-08-08 HISTORY — DX: Malignant neoplasm of unspecified ovary: C56.9

## 2010-08-08 HISTORY — DX: Diaphragmatic hernia without obstruction or gangrene: K44.9

## 2010-08-08 LAB — DIFFERENTIAL
Basophils Relative: 0 % (ref 0–1)
Eosinophils Absolute: 0.3 10*3/uL (ref 0.0–0.7)
Eosinophils Relative: 2 % (ref 0–5)
Lymphs Abs: 1.4 10*3/uL (ref 0.7–4.0)
Monocytes Relative: 5 % (ref 3–12)

## 2010-08-08 LAB — URINALYSIS, ROUTINE W REFLEX MICROSCOPIC
Bilirubin Urine: NEGATIVE
Specific Gravity, Urine: 1.025 (ref 1.005–1.030)
pH: 5 (ref 5.0–8.0)

## 2010-08-08 LAB — GLUCOSE, CAPILLARY: Glucose-Capillary: 234 mg/dL — ABNORMAL HIGH (ref 70–99)

## 2010-08-08 LAB — BASIC METABOLIC PANEL
BUN: 30 mg/dL — ABNORMAL HIGH (ref 6–23)
CO2: 26 mEq/L (ref 19–32)
Calcium: 9.3 mg/dL (ref 8.4–10.5)
Creatinine, Ser: 0.82 mg/dL (ref 0.50–1.10)
Glucose, Bld: 192 mg/dL — ABNORMAL HIGH (ref 70–99)
Sodium: 138 mEq/L (ref 135–145)

## 2010-08-08 LAB — CBC
MCH: 27.1 pg (ref 26.0–34.0)
MCHC: 32.2 g/dL (ref 30.0–36.0)
MCV: 84.2 fL (ref 78.0–100.0)
Platelets: 233 10*3/uL (ref 150–400)
RBC: 4.76 MIL/uL (ref 3.87–5.11)

## 2010-08-08 LAB — URINE MICROSCOPIC-ADD ON

## 2010-08-08 LAB — PROTIME-INR: Prothrombin Time: 28.2 seconds — ABNORMAL HIGH (ref 11.6–15.2)

## 2010-08-08 MED ORDER — ACETAMINOPHEN 650 MG RE SUPP: 650.0000 mg | Freq: Four times a day (QID) | RECTAL | Status: DC | PRN

## 2010-08-08 MED ORDER — PANTOPRAZOLE SODIUM 40 MG PO TBEC
80.0000 mg | DELAYED_RELEASE_TABLET | Freq: Every day | ORAL | Status: DC
  Administered 2010-08-08 – 2010-08-10 (×3): 80 mg via ORAL
  Filled 2010-08-08 (×3): qty 2

## 2010-08-08 MED ORDER — ONDANSETRON HCL 4 MG/2ML IJ SOLN: 4.0000 mg | Freq: Four times a day (QID) | INTRAMUSCULAR | Status: DC | PRN

## 2010-08-08 MED ORDER — OLMESARTAN MEDOXOMIL 20 MG PO TABS
40.0000 mg | ORAL_TABLET | Freq: Every day | ORAL | Status: DC
  Administered 2010-08-08 – 2010-08-11 (×5): 40 mg via ORAL
  Filled 2010-08-08 (×5): qty 2

## 2010-08-08 MED ORDER — GLIPIZIDE-METFORMIN HCL 2.5-500 MG PO TABS: 1.0000 | ORAL_TABLET | Freq: Two times a day (BID) | ORAL | Status: DC

## 2010-08-08 MED ORDER — METFORMIN HCL 500 MG PO TABS
500.0000 mg | ORAL_TABLET | Freq: Two times a day (BID) | ORAL | Status: DC
  Administered 2010-08-08: 500 mg via ORAL
  Filled 2010-08-08: qty 1

## 2010-08-08 MED ORDER — INSULIN ASPART 100 UNIT/ML ~~LOC~~ SOLN
6.0000 [IU] | Freq: Once | SUBCUTANEOUS | Status: AC
  Administered 2010-08-08: 6 [IU] via SUBCUTANEOUS
  Filled 2010-08-08: qty 3

## 2010-08-08 MED ORDER — WARFARIN SODIUM 6 MG PO TABS
6.0000 mg | ORAL_TABLET | Freq: Every day | ORAL | Status: DC
  Administered 2010-08-08: 6 mg via ORAL
  Filled 2010-08-08: qty 1

## 2010-08-08 MED ORDER — ALBUTEROL SULFATE (2.5 MG/3ML) 0.083% IN NEBU
INHALATION_SOLUTION | RESPIRATORY_TRACT | Status: AC
  Filled 2010-08-08: qty 3

## 2010-08-08 MED ORDER — SODIUM CHLORIDE 0.9 % IV SOLN: 250.0000 mL | INTRAVENOUS | Status: DC

## 2010-08-08 MED ORDER — GLIPIZIDE 2.5 MG HALF TABLET
2.5000 mg | ORAL_TABLET | Freq: Two times a day (BID) | ORAL | Status: DC
  Administered 2010-08-08: 2.5 mg via ORAL
  Filled 2010-08-08 (×6): qty 1

## 2010-08-08 MED ORDER — LEVOTHYROXINE SODIUM 112 MCG PO TABS
112.0000 ug | ORAL_TABLET | Freq: Every day | ORAL | Status: DC
  Administered 2010-08-09 – 2010-08-11 (×3): 112 ug via ORAL
  Filled 2010-08-08 (×5): qty 1

## 2010-08-08 MED ORDER — WARFARIN SODIUM 6 MG PO TABS: 7.0000 mg | ORAL_TABLET | ORAL | Status: DC

## 2010-08-08 MED ORDER — IPRATROPIUM BROMIDE 0.02 % IN SOLN
0.5000 mg | Freq: Four times a day (QID) | RESPIRATORY_TRACT | Status: DC
  Administered 2010-08-08 – 2010-08-11 (×12): 0.5 mg via RESPIRATORY_TRACT
  Filled 2010-08-08 (×10): qty 2.5

## 2010-08-08 MED ORDER — SODIUM CHLORIDE 0.9 % IJ SOLN
3.0000 mL | INTRAMUSCULAR | Status: DC | PRN
  Administered 2010-08-09: 3 mL via INTRAVENOUS
  Filled 2010-08-08 (×2): qty 3

## 2010-08-08 MED ORDER — INSULIN REGULAR HUMAN 100 UNIT/ML IJ SOLN: 6.0000 [IU] | Freq: Once | INTRAMUSCULAR | Status: DC

## 2010-08-08 MED ORDER — HYDROCODONE-ACETAMINOPHEN 5-325 MG PO TABS
1.0000 | ORAL_TABLET | ORAL | Status: DC | PRN
  Administered 2010-08-08 (×2): 1 via ORAL
  Administered 2010-08-09 – 2010-08-11 (×4): 2 via ORAL
  Filled 2010-08-08 (×3): qty 2
  Filled 2010-08-08: qty 1
  Filled 2010-08-08: qty 2
  Filled 2010-08-08: qty 1

## 2010-08-08 MED ORDER — IPRATROPIUM BROMIDE 0.02 % IN SOLN
0.5000 mg | Freq: Once | RESPIRATORY_TRACT | Status: AC
  Administered 2010-08-08: 0.5 mg via RESPIRATORY_TRACT
  Filled 2010-08-08: qty 2.5

## 2010-08-08 MED ORDER — TRIAMTERENE-HCTZ 37.5-25 MG PO TABS
1.0000 | ORAL_TABLET | Freq: Every day | ORAL | Status: DC
  Administered 2010-08-08 – 2010-08-11 (×4): 1 via ORAL
  Filled 2010-08-08 (×4): qty 1

## 2010-08-08 MED ORDER — METHYLPREDNISOLONE SODIUM SUCC 125 MG IJ SOLR
125.0000 mg | Freq: Three times a day (TID) | INTRAMUSCULAR | Status: DC
  Administered 2010-08-08 – 2010-08-09 (×4): 125 mg via INTRAVENOUS
  Filled 2010-08-08 (×4): qty 2

## 2010-08-08 MED ORDER — ROSUVASTATIN CALCIUM 20 MG PO TABS: 20.0000 mg | ORAL_TABLET | Freq: Every day | ORAL | Status: DC

## 2010-08-08 MED ORDER — ALBUTEROL SULFATE (2.5 MG/3ML) 0.083% IN NEBU
2.5000 mg | INHALATION_SOLUTION | Freq: Once | RESPIRATORY_TRACT | Status: AC
  Administered 2010-08-08: 2.5 mg via RESPIRATORY_TRACT
  Filled 2010-08-08: qty 3

## 2010-08-08 MED ORDER — SENNA 8.6 MG PO TABS: 2.0000 | ORAL_TABLET | Freq: Every day | ORAL | Status: DC | PRN

## 2010-08-08 MED ORDER — ACETAMINOPHEN 325 MG PO TABS: 650.0000 mg | ORAL_TABLET | Freq: Four times a day (QID) | ORAL | Status: DC | PRN

## 2010-08-08 MED ORDER — ALBUTEROL SULFATE (2.5 MG/3ML) 0.083% IN NEBU
INHALATION_SOLUTION | RESPIRATORY_TRACT | Status: AC
  Administered 2010-08-08: 2.5 mg via RESPIRATORY_TRACT
  Filled 2010-08-08: qty 3

## 2010-08-08 MED ORDER — SODIUM CHLORIDE 0.9 % IJ SOLN
3.0000 mL | Freq: Two times a day (BID) | INTRAMUSCULAR | Status: DC
  Administered 2010-08-08 – 2010-08-09 (×5): 3 mL via INTRAVENOUS
  Filled 2010-08-08 (×5): qty 3

## 2010-08-08 MED ORDER — ALBUTEROL SULFATE (2.5 MG/3ML) 0.083% IN NEBU
5.0000 mg | INHALATION_SOLUTION | RESPIRATORY_TRACT | Status: DC | PRN
  Administered 2010-08-08: 2.5 mg via RESPIRATORY_TRACT
  Filled 2010-08-08 (×2): qty 6
  Filled 2010-08-08: qty 3
  Filled 2010-08-08: qty 6
  Filled 2010-08-08: qty 3
  Filled 2010-08-08: qty 6

## 2010-08-08 MED ORDER — AMLODIPINE BESYLATE 5 MG PO TABS
10.0000 mg | ORAL_TABLET | Freq: Every day | ORAL | Status: DC
  Administered 2010-08-08 – 2010-08-11 (×4): 10 mg via ORAL
  Filled 2010-08-08 (×4): qty 2

## 2010-08-08 MED ORDER — PREDNISONE 20 MG PO TABS
60.0000 mg | ORAL_TABLET | Freq: Once | ORAL | Status: AC
  Administered 2010-08-08: 60 mg via ORAL
  Filled 2010-08-08: qty 3

## 2010-08-08 MED ORDER — ONDANSETRON HCL 4 MG PO TABS: 4.0000 mg | ORAL_TABLET | Freq: Four times a day (QID) | ORAL | Status: DC | PRN

## 2010-08-08 MED ORDER — ROSUVASTATIN CALCIUM 5 MG PO TABS
5.0000 mg | ORAL_TABLET | Freq: Every day | ORAL | Status: DC
  Administered 2010-08-08 – 2010-08-10 (×3): 5 mg via ORAL
  Filled 2010-08-08 (×3): qty 1

## 2010-08-08 MED ORDER — ALBUTEROL SULFATE (5 MG/ML) 0.5% IN NEBU
2.5000 mg | INHALATION_SOLUTION | Freq: Four times a day (QID) | RESPIRATORY_TRACT | Status: DC
  Administered 2010-08-08: 15:00:00 via RESPIRATORY_TRACT
  Administered 2010-08-08 – 2010-08-11 (×11): 2.5 mg via RESPIRATORY_TRACT

## 2010-08-08 MED ORDER — ZOLPIDEM TARTRATE 5 MG PO TABS
5.0000 mg | ORAL_TABLET | Freq: Every evening | ORAL | Status: DC | PRN
  Administered 2010-08-09: 5 mg via ORAL
  Filled 2010-08-08: qty 1

## 2010-08-08 NOTE — H&P (Signed)
[name] is an 60 y.o. female.   Chief Complaint: Dyspnea. HPI: This 60 year old lady gives a 2-3 week history of dyspnea. She has had a dry cough associated with this. She also tells me that she is unable to lie flat because she becomes dyspneic. Initially she felt it might be due to a new antihypertensive medication that her primary care physician had prescribed but she discontinued this on his advice after 2-3 days and still remained dyspneic. She does describe getting episodes of wheezing when the weather changes and becomes extremely hot as it has been lately. She denies a previous history of asthma or or allergies. There is no chest pain, palpitations associated with this dyspnea. There is not been any increased swelling of her legs. There has been no hemoptysis. She does have a history of 3 episodes of pulmonary embolism in the past. In the emergency room she was given bronchodilators via nebulizer treatment but has failed to resolve her wheezing. We are now asked to admit this patient for further management.  Past Medical History  Diagnosis Date  . Diabetes mellitus   . Hypertension   . Pulmonary embolism   . Hiatal hernia   . Ovarian cancer     Past Surgical History  Procedure Date  . Abdominal surgery   . Cholecystectomy   . Appendectomy   . Cesarean section   . Tonsillectomy   . Breast lumpectomy     History reviewed. No pertinent family history. Social History:  reports that she has quit smoking. In fact, she has not smoked cigarettes for the last 30 years. Prior to this she had smoked for approximately 14 years. She does not have any smokeless tobacco history on file. She reports that she does not drink alcohol or use illicit drugs. She has been married for 40 years and lives with her husband.  Allergies:  Allergies  Allergen Reactions  . Codeine   . Penicillins   . Sulfa Antibiotics     Medications Prior to Admission  Medication Dose Route Frequency  Provider Last Rate Last Dose  . albuterol (PROVENTIL) (2.5 MG/3ML) 0.083% nebulizer solution 2.5 mg  2.5 mg Nebulization Once Shelda Jakes, MD   2.5 mg at 08/08/10 0742  . albuterol (PROVENTIL) (2.5 MG/3ML) 0.083% nebulizer solution 5 mg  5 mg Nebulization Q4H PRN Shelda Jakes, MD   2.5 mg at 08/08/10 1610  . ipratropium (ATROVENT) 0.02 % nebulizer solution 0.5 mg  0.5 mg Nebulization Once Shelda Jakes, MD   0.5 mg at 08/08/10 9604  . predniSONE (DELTASONE) tablet 60 mg  60 mg Oral Once Shelda Jakes, MD   60 mg at 08/08/10 0600   No current outpatient prescriptions on file as of 08/08/2010.    Results for orders placed during the hospital encounter of 08/08/10 (from the past 48 hour(s))  BASIC METABOLIC PANEL     Status: Abnormal   Collection Time   08/08/10  5:57 AM      Component Value Range Comment   Sodium 138  135 - 145 (mEq/L)    Potassium 4.2  3.5 - 5.1 (mEq/L)    Chloride 101  96 - 112 (mEq/L)    CO2 26  19 - 32 (mEq/L)    Glucose, Bld 192 (*) 70 - 99 (mg/dL)    BUN 30 (*) 6 - 23 (mg/dL)    Creatinine, Ser 5.40  0.50 - 1.10 (mg/dL) **Please note change in reference range.**   Calcium  9.3  8.4 - 10.5 (mg/dL)    GFR calc non Af Amer >60  >60 (mL/min)    GFR calc Af Amer >60  >60 (mL/min)   CBC     Status: Abnormal   Collection Time   08/08/10  5:57 AM      Component Value Range Comment   WBC 10.9 (*) 4.0 - 10.5 (K/uL)    RBC 4.76  3.87 - 5.11 (MIL/uL)    Hemoglobin 12.9  12.0 - 15.0 (g/dL)    HCT 04.5  40.9 - 81.1 (%)    MCV 84.2  78.0 - 100.0 (fL)    MCH 27.1  26.0 - 34.0 (pg)    MCHC 32.2  30.0 - 36.0 (g/dL)    RDW 91.4  78.2 - 95.6 (%)    Platelets 233  150 - 400 (K/uL)   DIFFERENTIAL     Status: Abnormal   Collection Time   08/08/10  5:57 AM      Component Value Range Comment   Neutrophils Relative 80 (*) 43 - 77 (%)    Neutro Abs 8.7 (*) 1.7 - 7.7 (K/uL)    Lymphocytes Relative 13  12 - 46 (%)    Lymphs Abs 1.4  0.7 - 4.0 (K/uL)    Monocytes  Relative 5  3 - 12 (%)    Monocytes Absolute 0.5  0.1 - 1.0 (K/uL)    Eosinophils Relative 2  0 - 5 (%)    Eosinophils Absolute 0.3  0.0 - 0.7 (K/uL)    Basophils Relative 0  0 - 1 (%)    Basophils Absolute 0.0  0.0 - 0.1 (K/uL)   PROTIME-INR     Status: Abnormal   Collection Time   08/08/10  5:57 AM      Component Value Range Comment   Prothrombin Time 28.2 (*) 11.6 - 15.2 (seconds)    INR 2.59 (*) 0.00 - 1.49     No results found.  ROS: Apart from the symptoms mentioned above there are no other symptoms referable to all systems reviewed.  Physical Exam: Blood pressure 130/72, pulse 110, resp. rate 20, height 5\' 1"  (1.549 m), weight 148.78 kg (328 lb), SpO2 94.00%. She is morbidly obese. Heart sounds are present without murmurs. Jugular venous pressure is not raised. Lung fields show bilateral wheezing and she is relatively tight. There is no bronchial breathing or crackles. Abdomen is soft and morbid the obese and so is difficult to assess for any masses. I do not believe she has hepatosplenomegaly or any other masses. She is alert and oriented without any focal neurological signs. There are no skin changes which are abnormal.   Impression:  Principal Problem:  *Bronchitis with bronchospasm Morbid obesity. Hypertension. Diabetes. Hypothyroidism. Hyperlipidemia.    Plan: 1. Admit to regular medical bed. 2. Intravenous steroids. I do not think she needs antibiotics as there is no real indication of a chest infection at this time. Bronchodilators. 3. I will order an echocardiogram to make sure she does not have any cardiac dysfunction in view of her history of orthopnea. Further recommendations will depend on patient's hospital progress.       Dillyn Menna C 08/08/2010, 8:45 AM

## 2010-08-08 NOTE — ED Notes (Signed)
Report given to michelle

## 2010-08-08 NOTE — Plan of Care (Signed)
Problem: Phase I Progression Outcomes Goal: Progress activity as tolerated unless otherwise ordered Outcome: Progressing Pt. Will ring for assistance

## 2010-08-08 NOTE — Progress Notes (Signed)
Dr.Gosrani -please clarify,if  would you like to add Albuterol 2.5mg  to the scheduled Q6 Atrovent nebulizer you have already ordered for this patient.

## 2010-08-08 NOTE — ED Notes (Signed)
Pt has been resting in bed, voices no complaints, resp even and unlabored

## 2010-08-08 NOTE — ED Notes (Signed)
Pt to be admit. Waiting on bed assign

## 2010-08-08 NOTE — ED Notes (Signed)
Pt cont to wheeze. Breathing tx req

## 2010-08-08 NOTE — ED Provider Notes (Addendum)
History     Chief Complaint  Patient presents with  . Shortness of Breath   Patient is a 60 y.o. female presenting with shortness of breath. The history is provided by the patient, the spouse and the EMS personnel.  Shortness of Breath  The current episode started more than 2 weeks ago. The onset was gradual. The problem occurs continuously. The problem has been gradually worsening. The problem is moderate. The symptoms are relieved by nothing. The symptoms are aggravated by activity. Associated symptoms include cough, shortness of breath and wheezing. Pertinent negatives include no chest pain, no chest pressure, no fever and no sore throat. The cough has no precipitants. The cough is productive. Nothing relieves the cough. Her past medical history is significant for asthma and past wheezing.  HX OF PE IN PAST ON COUMADIN NOW. SOB GETTING WORSE FOR OVER 2 WEEKS. ALSO HX OF ASTHMA BRONCHITIS. FOLLOWED BY DR Timothy Lasso HALL. NOT ON OXYGEN AT HOME. NO FEVER.   Past Medical History  Diagnosis Date  . Diabetes mellitus   . Hypertension   . Pulmonary embolism   . Hiatal hernia   . Ovarian cancer     Past Surgical History  Procedure Date  . Abdominal surgery   . Cholecystectomy   . Appendectomy   . Cesarean section   . Tonsillectomy   . Breast lumpectomy     History reviewed. No pertinent family history.  History  Substance Use Topics  . Smoking status: Former Games developer  . Smokeless tobacco: Not on file  . Alcohol Use: No    OB History    Grav Para Term Preterm Abortions TAB SAB Ect Mult Living                  Review of Systems  Constitutional: Positive for fatigue. Negative for fever.  HENT: Negative for sore throat.   Eyes: Negative for redness.  Respiratory: Positive for cough, shortness of breath and wheezing.   Cardiovascular: Negative for chest pain, palpitations and leg swelling.  Gastrointestinal: Negative for nausea and abdominal pain.  Genitourinary: Positive for  dysuria. Negative for hematuria.  Musculoskeletal: Negative for back pain.  Neurological: Negative for headaches.  Hematological: Negative for adenopathy.  Psychiatric/Behavioral: Negative for confusion.    Physical Exam  BP 130/72  Pulse 110  Ht 5\' 1"  (1.549 m)  Wt 328 lb (148.78 kg)  BMI 61.98 kg/m2  SpO2 98%  Physical Exam  Constitutional: She is oriented to person, place, and time. She appears well-developed and well-nourished. She appears distressed.  HENT:  Head: Normocephalic and atraumatic.  Mouth/Throat: Oropharynx is clear and moist.  Eyes: Conjunctivae and EOM are normal. Pupils are equal, round, and reactive to light.  Neck: Normal range of motion. Neck supple.  Cardiovascular: Normal rate and regular rhythm.   No murmur heard. Pulmonary/Chest: Effort normal. She has wheezes. She exhibits no tenderness.  Abdominal: Soft. Bowel sounds are normal. There is no tenderness.  Musculoskeletal: Normal range of motion. She exhibits no edema and no tenderness.  Lymphadenopathy:    She has no cervical adenopathy.  Neurological: She is alert and oriented to person, place, and time. No cranial nerve deficit. She exhibits normal muscle tone.  Skin: Skin is warm and dry. No rash noted. She is not diaphoretic.    ED Course  STILL WHEEZING AFTER 3 RD NEB. WILL CONSUTL TRIAD FOR ADMISSION UA AND EKG PENDING.   Procedures  MDM IN ED CXR NEGATIVE FOR PNEUMONIA OR CHF. DEFINITELY WHEEZING,  IMPROVED AFTER EACH NEBULIZER FIRST 2 WITH ALBUTEROL AND ATROVENT. 3RD WITH JUST ATROVENT. IF STILL WHEEZING WILL NEED TRAID ADMISSION. ALSO GIVNEN PO PREDNISONE 60 MG. SATS WITH OXYGEN 2 LITERS AROUND 95%. BASIC LABS OKAY. STILL NEEDS UA AND EKG.   INR IS THERAPUTIC AT 2.9. WITH ALL THE WHEEZING AND REAL NO SIG CP DOUBT PE BUT SUSPECT EXACERBATION OF BRONCHITIS COPD OR ASTHMA.      Shelda Jakes, MD 08/08/10 1610  Shelda Jakes, MD 08/08/10 0745  Shelda Jakes,  MD 08/08/10 [phone]

## 2010-08-08 NOTE — ED Notes (Signed)
Called team 2 Gosrani for admit Bronchospasm

## 2010-08-08 NOTE — ED Notes (Signed)
Pt being eval by hospitalist 

## 2010-08-08 NOTE — Progress Notes (Signed)
ANTICOAGULATION CONSULT NOTE - Initial  Indication: Pulmonary embolism  Patient Measurements: Height: 5\' 1"  (154.9 cm) Weight: 330 lb 9.6 oz (149.959 kg) IBW/kg (Calculated) : 47.8    Vital Signs: Temp: 98.3 F (36.8 C) (07/13 0900) Temp src: Oral (07/13 0900) BP: 154/83 mmHg (07/13 0900) Pulse Rate: 114  (07/13 0900)  Labs: INR Last Three Days: Recent Labs  [facility] 08/08/10 0557   INR 2.59*    Medical History: Past Medical History  Diagnosis Date  . Diabetes mellitus   . Hypertension   . Pulmonary embolism   . Hiatal hernia   . Ovarian cancer     Medications:  Scheduled:    . albuterol  2.5 mg Nebulization Once  . amLODipine  10 mg Oral Daily  . glipiZIDE  2.5 mg Oral BID AC  . ipratropium  0.5 mg Nebulization Once  . ipratropium  0.5 mg Nebulization Q6H  . levothyroxine  112 mcg Oral QAC breakfast  . metFORMIN  500 mg Oral BID WC  . methylPREDNISolone sodium succinate  125 mg Intravenous Q8H  . olmesartan  40 mg Oral Daily  . pantoprazole  80 mg Oral Q1200  . predniSONE  60 mg Oral Once  . rosuvastatin  5 mg Oral q1800  . sodium chloride  3 mL Intravenous Q12H  . triamterene-hydrochlorothiazide  1 tablet Oral Daily  . warfarin  6 mg Oral q1800  . DISCONTD: glipiZIDE-metformin  1 tablet Oral BID AC  . DISCONTD: rosuvastatin  20 mg Oral Daily  . DISCONTD: warfarin  7 mg Oral 1 day or 1 dose    Assessment: inr therapeutic, home dose varies, some interacting meds with current illness Goal of Therapy:  INR 2-3   Plan: Change warfarin to 6mg  daily for now and monitor INR daily, adjust dosing as needed.  Margo Aye, Neomi Laidler A 08/08/2010,10:51 AM

## 2010-08-08 NOTE — ED Notes (Signed)
Pt brought in by [facility] for c/o sob and wheezing; pt states she has had sob x2 weeks with increasing sob this evening; pt received albuterol/atrovent en route to ED by [facility]

## 2010-08-08 NOTE — ED Notes (Signed)
Pt stated she has been sick for 3 weeks with cold, cough and hard to breath, has not called pmd,  "lungs "started hurting yesterday.  Denies fever.

## 2010-08-09 ENCOUNTER — Inpatient Hospital Stay (HOSPITAL_COMMUNITY): Payer: 59

## 2010-08-09 LAB — CBC
Hemoglobin: 13 g/dL (ref 12.0–15.0)
MCHC: 32.6 g/dL (ref 30.0–36.0)
RBC: 4.83 MIL/uL (ref 3.87–5.11)
WBC: 13.2 10*3/uL — ABNORMAL HIGH (ref 4.0–10.5)

## 2010-08-09 LAB — HEMOGLOBIN A1C
Hgb A1c MFr Bld: 7 % — ABNORMAL HIGH (ref <5.7)
Mean Plasma Glucose: 154 mg/dL — ABNORMAL HIGH (ref <117)

## 2010-08-09 LAB — PROTIME-INR
INR: 2.12 — ABNORMAL HIGH (ref 0.00–1.49)
Prothrombin Time: 24.1 seconds — ABNORMAL HIGH (ref 11.6–15.2)

## 2010-08-09 LAB — COMPREHENSIVE METABOLIC PANEL
BUN: 25 mg/dL — ABNORMAL HIGH (ref 6–23)
CO2: 26 mEq/L (ref 19–32)
Calcium: 9.5 mg/dL (ref 8.4–10.5)
Creatinine, Ser: 0.76 mg/dL (ref 0.50–1.10)
GFR calc Af Amer: >60 mL/min (ref >60)
GFR calc non Af Amer: >60 mL/min (ref >60)
Glucose, Bld: 284 mg/dL — ABNORMAL HIGH (ref 70–99)

## 2010-08-09 LAB — GLUCOSE, CAPILLARY
Glucose-Capillary: 346 mg/dL — ABNORMAL HIGH (ref 70–99)
Glucose-Capillary: 279 mg/dL — ABNORMAL HIGH (ref 70–99)

## 2010-08-09 MED ORDER — IPRATROPIUM BROMIDE 0.02 % IN SOLN
RESPIRATORY_TRACT | Status: AC
  Administered 2010-08-09: 0.5 mg via RESPIRATORY_TRACT
  Filled 2010-08-09: qty 2.5

## 2010-08-09 MED ORDER — IOHEXOL 350 MG/ML SOLN
100.0000 mL | Freq: Once | INTRAVENOUS | Status: AC | PRN
  Administered 2010-08-09: 100 mL via INTRAVENOUS

## 2010-08-09 MED ORDER — METFORMIN HCL 500 MG PO TABS: 1000.0000 mg | ORAL_TABLET | Freq: Two times a day (BID) | ORAL | Status: DC

## 2010-08-09 MED ORDER — SODIUM CHLORIDE 0.9 % IJ SOLN
INTRAMUSCULAR | Status: AC
  Filled 2010-08-09: qty 10

## 2010-08-09 MED ORDER — METFORMIN HCL 500 MG PO TABS
1000.0000 mg | ORAL_TABLET | Freq: Two times a day (BID) | ORAL | Status: DC
  Administered 2010-08-11: 1000 mg via ORAL
  Filled 2010-08-09: qty 2

## 2010-08-09 MED ORDER — SODIUM CHLORIDE 0.9 % IJ SOLN
INTRAMUSCULAR | Status: AC
  Administered 2010-08-09: 3 mL via INTRAVENOUS
  Filled 2010-08-09: qty 10

## 2010-08-09 MED ORDER — ALBUTEROL SULFATE (2.5 MG/3ML) 0.083% IN NEBU
INHALATION_SOLUTION | RESPIRATORY_TRACT | Status: AC
  Filled 2010-08-09: qty 3

## 2010-08-09 MED ORDER — ALBUTEROL SULFATE (2.5 MG/3ML) 0.083% IN NEBU
INHALATION_SOLUTION | RESPIRATORY_TRACT | Status: AC
  Administered 2010-08-09: 2.5 mg via RESPIRATORY_TRACT
  Filled 2010-08-09: qty 3

## 2010-08-09 MED ORDER — INSULIN ASPART 100 UNIT/ML ~~LOC~~ SOLN
0.0000 [IU] | Freq: Three times a day (TID) | SUBCUTANEOUS | Status: DC
  Administered 2010-08-09: 15 [IU] via SUBCUTANEOUS
  Administered 2010-08-09: 11 [IU] via SUBCUTANEOUS
  Administered 2010-08-10: 15 [IU] via SUBCUTANEOUS
  Administered 2010-08-10: 11 [IU] via SUBCUTANEOUS
  Administered 2010-08-10: 15 [IU] via SUBCUTANEOUS
  Administered 2010-08-11: 7 [IU] via SUBCUTANEOUS

## 2010-08-09 MED ORDER — INSULIN GLARGINE 100 UNIT/ML ~~LOC~~ SOLN
10.0000 [IU] | Freq: Every day | SUBCUTANEOUS | Status: DC
  Administered 2010-08-09 – 2010-08-10 (×2): 10 [IU] via SUBCUTANEOUS
  Filled 2010-08-09: qty 3

## 2010-08-09 MED ORDER — WARFARIN SODIUM 7.5 MG PO TABS
7.5000 mg | ORAL_TABLET | Freq: Once | ORAL | Status: AC
  Administered 2010-08-09: 7.5 mg via ORAL
  Filled 2010-08-09: qty 1

## 2010-08-09 MED ORDER — GLIPIZIDE 5 MG PO TABS
5.0000 mg | ORAL_TABLET | Freq: Two times a day (BID) | ORAL | Status: DC
  Administered 2010-08-09 – 2010-08-11 (×4): 5 mg via ORAL
  Filled 2010-08-09 (×4): qty 1

## 2010-08-09 MED ORDER — METHYLPREDNISOLONE SODIUM SUCC 125 MG IJ SOLR
125.0000 mg | Freq: Four times a day (QID) | INTRAMUSCULAR | Status: DC
  Administered 2010-08-09 – 2010-08-10 (×4): 125 mg via INTRAVENOUS
  Filled 2010-08-09 (×5): qty 2

## 2010-08-09 NOTE — Progress Notes (Signed)
Pharmacy Coumadin protocol Goal INR 2-3 INR today         2.12 INR Yesterday 2.59 Coumadin 7.5 mg po x1 today Follow up INR in AM [place], [name] 08/09/2010

## 2010-08-09 NOTE — Progress Notes (Addendum)
Subjective: This lady says she has improved some, but still has wheezing. He denies any chest pain, hemoptysis or cough productive of purulent sputum.       Intake/Output from previous day: 07/13 0701 - 07/14 0700 In: 720 [P.O.:720] Out: 1426 [Urine:1425; Stool:1]   Physical Exam: The vital signs XBJ:YNWG:  [97.4 F (36.3 C)-98.3 F (36.8 C)] 97.9 F (36.6 C) (07/14 0541) Pulse Rate:  [97-114] 97  (07/14 0625) Resp:  [17-20] 20  (07/14 0541) BP: (120-177)/(66-91) 142/80 mmHg (07/14 0625) SpO2:  [92 %-96 %] 93 % (07/14 0625) Weight:  [149.959 kg (330 lb 9.6 oz)] 330 lb 9.6 oz (149.959 kg) (07/13 0900)  She is morbidly obese as described previously. Examination of her lungs shows bilateral wheezing but less so than yesterday. No new physical findings.   Lab Results:  [facility] 08/09/10 0514 08/08/10 0557  WBC 13.2* 10.9*  HGB 13.0 12.9  HCT 39.9 40.1  PLT 277 233    Basename 08/09/10 0514 08/08/10 0557  NA 137 138  K 3.8 4.2  CL 100 101  CO2 26 26  GLUCOSE 284* 192*  BUN 25* 30*  CREATININE 0.76 0.82  CALCIUM 9.5 9.3   No results found for this or any previous visit (from the past 240 hour(s)).  Studies/Results: Dg Chest Portable 1 View  08/08/2010  *RADIOLOGY REPORT*  Clinical Data: Short of breath, cough and wheeze  PORTABLE CHEST - 1 VIEW  Comparison: 01/03/2008  Findings: Mild cardiac enlargement with normal pulmonary vascularity.  No focal airspace consolidation in the lungs.  No blunting of costophrenic angles.  No pneumothorax.  Tortuous aorta.  IMPRESSION: No evidence of active pulmonary disease.  Cardiac enlargement.  Original Report Authenticated By: Marlon Pel, M.D.    Medications: I have reviewed the patient's current medications.  Impression:  Principal Problem:  *Bronchitis with bronchospasm     Plan: 1. Increase intravenous steroids. 2. CT chest angiogram to make sure this lady has not presented with another pulmonary embolism. 3.  Increase oral hypoglycemics in view of increasing steroids and use sliding scale of insulin.  I   LOS: 1 day   GOSRANI,NIMISH C 08/09/2010, 8:15 AM

## 2010-08-10 LAB — URINALYSIS, ROUTINE W REFLEX MICROSCOPIC
Glucose, UA: NEGATIVE mg/dL
Leukocytes, UA: NEGATIVE
Protein, ur: NEGATIVE mg/dL
Urobilinogen, UA: 0.2 mg/dL (ref 0.0–1.0)

## 2010-08-10 LAB — GLUCOSE, CAPILLARY
Glucose-Capillary: 309 mg/dL — ABNORMAL HIGH (ref 70–99)
Glucose-Capillary: 276 mg/dL — ABNORMAL HIGH (ref 70–99)

## 2010-08-10 MED ORDER — WARFARIN SODIUM 2.5 MG PO TABS
2.5000 mg | ORAL_TABLET | Freq: Once | ORAL | Status: AC
  Administered 2010-08-10: 2.5 mg via ORAL
  Filled 2010-08-10: qty 1

## 2010-08-10 MED ORDER — PREDNISONE 20 MG PO TABS
40.0000 mg | ORAL_TABLET | Freq: Every day | ORAL | Status: DC
  Administered 2010-08-10 – 2010-08-11 (×2): 40 mg via ORAL
  Filled 2010-08-10 (×2): qty 2

## 2010-08-10 MED ORDER — NYSTATIN 100000 UNIT/GM EX POWD
CUTANEOUS | Status: AC
  Filled 2010-08-10: qty 15

## 2010-08-10 MED ORDER — ALBUTEROL SULFATE (2.5 MG/3ML) 0.083% IN NEBU
INHALATION_SOLUTION | RESPIRATORY_TRACT | Status: AC
  Administered 2010-08-10: 2.5 mg via RESPIRATORY_TRACT
  Filled 2010-08-10: qty 3

## 2010-08-10 MED ORDER — NON FORMULARY: 40.0000 mg | Status: DC

## 2010-08-10 MED ORDER — NYSTATIN 100000 UNIT/GM EX POWD
Freq: Two times a day (BID) | CUTANEOUS | Status: DC
  Administered 2010-08-10: 22:00:00 via TOPICAL
  Filled 2010-08-10: qty 15

## 2010-08-10 MED ORDER — ESOMEPRAZOLE MAGNESIUM 40 MG PO CPDR
40.0000 mg | DELAYED_RELEASE_CAPSULE | Freq: Every day | ORAL | Status: DC
  Filled 2010-08-10 (×3): qty 1

## 2010-08-10 MED ORDER — PNEUMOCOCCAL VAC POLYVALENT 25 MCG/0.5ML IJ INJ
0.5000 mL | INJECTION | Freq: Once | INTRAMUSCULAR | Status: AC
  Administered 2010-08-10: 0.5 mL via INTRAMUSCULAR
  Filled 2010-08-10: qty 0.5

## 2010-08-10 NOTE — Progress Notes (Signed)
Pharmacy Coumadin protocol Goal INR 2-3 INR today           2.67 INR Yesterday   2.12 Coumadin 2.5 mg po x1 today Follow up INR in AM [place], Yoland Scherr Bennett 08/10/2010

## 2010-08-10 NOTE — Progress Notes (Signed)
Subjective: Although this lady says she is feeling slightly better, I think she is much improved. He has not been coughing is much more wheezing as much.           Physical Exam: The vital signs BMW:UXLK:  [97.3 F (36.3 C)-98.3 F (36.8 C)] 97.3 F (36.3 C) (07/15 0615) Pulse Rate:  [92-104] 104  (07/15 0805) Resp:  [18-20] 20  (07/15 0805) BP: (127-137)/(72-78) 137/78 mmHg (07/15 0615) SpO2:  [93 %-96 %] 96 % (07/15 0805) Heart sounds are present without murmurs. Lung fields are actually very clear today without any wheezing except when she coughed there was wheezing to a limited extent at this time. There is no crackles no bronchial breathing.   Investigations: Results for orders placed during the hospital encounter of 08/08/10 (from the past 24 hour(s))  GLUCOSE, CAPILLARY     Status: Abnormal   Collection Time   08/09/10 11:36 AM      Component Value Range   Glucose-Capillary 346 (*) 70 - 99 (mg/dL)  GLUCOSE, CAPILLARY     Status: Abnormal   Collection Time   08/09/10  4:19 PM      Component Value Range   Glucose-Capillary 299 (*) 70 - 99 (mg/dL)  GLUCOSE, CAPILLARY     Status: Abnormal   Collection Time   08/09/10  8:19 PM      Component Value Range   Glucose-Capillary 279 (*) 70 - 99 (mg/dL)   Comment 1 Notify RN    PROTIME-INR     Status: Abnormal   Collection Time   08/10/10  5:37 AM      Component Value Range   Prothrombin Time 28.9 (*) 11.6 - 15.2 (seconds)   INR 2.67 (*) 0.00 - 1.49   GLUCOSE, CAPILLARY     Status: Abnormal   Collection Time   08/10/10  7:14 AM      Component Value Range   Glucose-Capillary 309 (*) 70 - 99 (mg/dL)   No results found for this or any previous visit (from the past 240 hour(s)).   CT ANGIOGRAPHY CHEST WITH CONTRAST  Technique: Multidetector CT imaging of the chest was performed using the standard protocol during bolus administration of intravenous contrast. Multiplanar CT image reconstructions including MIPs were  obtained to evaluate the vascular anatomy.  Contrast: 100 ml of Omnipaque 350  Comparison: Chest radiograph 08/08/2010  Findings: There is mild left and mild to moderate right bibasilar atelectasis. There are no pleural effusions. There is no significant hilar or mediastinal adenopathy. Reactive mediastinal lymph nodes are present. There is atherosclerotic calcification of the aortic arch and of coronary arteries. There is mild cardiomegaly. There are no filling defects in the pulmonary arterial system to suggest embolism. There is no acute musculoskeletal abnormality.  Review of the MIP images confirms the above findings.  IMPRESSION: Bilateral lower lobe atelectasis. No evidence of pulmonary embolism.     Medications: I have reviewed the patient's current medications.  Impression: 1. Bronchitis with bronchospasm. This is improving. 2. Diabetes, uncontrolled due to steroids. 3. Morbid obesity. 4. Hypertension, controlled. 5. History of pulmonary embolism. There has been no new pulmonary embolism on this admission.      Plan: 1. Discontinue intravenous steroids and replace with oral prednisone. 2. Pneumonia vaccine as she has not had one for over 5 years. 3. Anticipate discharge home tomorrow.     LOS: 2 days   Montrelle Eddings C 08/10/2010, 10:26 AM

## 2010-08-10 NOTE — Plan of Care (Signed)
Problem: Phase II Progression Outcomes Goal: O2 sats > equal to 90% on RA or at baseline Outcome: Adequate for Discharge Patient will be discharged home with oxygen

## 2010-08-10 NOTE — Plan of Care (Signed)
Problem: Phase I Progression Outcomes Goal: O2 sats > or equal 90% or at baseline Outcome: Adequate for Discharge  Patient will be discharged home with oxygen per md

## 2010-08-11 LAB — COMPREHENSIVE METABOLIC PANEL
ALT: 27 U/L (ref 0–35)
Alkaline Phosphatase: 75 U/L (ref 39–117)
CO2: 27 mEq/L (ref 19–32)
Calcium: 9.1 mg/dL (ref 8.4–10.5)
GFR calc Af Amer: >60 mL/min (ref >60)
GFR calc non Af Amer: >60 mL/min (ref >60)
Glucose, Bld: 278 mg/dL — ABNORMAL HIGH (ref 70–99)
Sodium: 136 mEq/L (ref 135–145)

## 2010-08-11 LAB — CBC
HCT: 39.6 % (ref 36.0–46.0)
Hemoglobin: 12.7 g/dL (ref 12.0–15.0)
MCH: 26.6 pg (ref 26.0–34.0)
RBC: 4.77 MIL/uL (ref 3.87–5.11)

## 2010-08-11 LAB — PROTIME-INR: INR: 3.69 — ABNORMAL HIGH (ref 0.00–1.49)

## 2010-08-11 MED ORDER — IPRATROPIUM BROMIDE 0.02 % IN SOLN
RESPIRATORY_TRACT | Status: AC
  Administered 2010-08-11: 0.5 mg via RESPIRATORY_TRACT
  Filled 2010-08-11: qty 2.5

## 2010-08-11 MED ORDER — ALBUTEROL SULFATE (2.5 MG/3ML) 0.083% IN NEBU
INHALATION_SOLUTION | RESPIRATORY_TRACT | Status: AC
  Administered 2010-08-11: 2.5 mg via RESPIRATORY_TRACT
  Filled 2010-08-11: qty 3

## 2010-08-11 MED ORDER — AMLODIPINE BESYLATE 10 MG PO TABS: 10.0000 mg | ORAL_TABLET | Freq: Every day | ORAL | Status: DC

## 2010-08-11 MED ORDER — FLUTICASONE-SALMETEROL 250-50 MCG/DOSE IN AEPB: 1.0000 | INHALATION_SPRAY | Freq: Two times a day (BID) | RESPIRATORY_TRACT | Status: DC

## 2010-08-11 MED ORDER — ALBUTEROL SULFATE (2.5 MG/3ML) 0.083% IN NEBU
INHALATION_SOLUTION | RESPIRATORY_TRACT | Status: AC
  Filled 2010-08-11: qty 3

## 2010-08-11 MED ORDER — PREDNISONE 20 MG PO TABS: 40.0000 mg | ORAL_TABLET | Freq: Every day | ORAL | Status: AC

## 2010-08-11 MED ORDER — IPRATROPIUM BROMIDE 0.02 % IN SOLN: 500.0000 ug | Freq: Four times a day (QID) | RESPIRATORY_TRACT | Status: DC

## 2010-08-11 MED ORDER — GLIPIZIDE 5 MG PO TABS: 5.0000 mg | ORAL_TABLET | Freq: Two times a day (BID) | ORAL | Status: DC

## 2010-08-11 MED ORDER — FLUTICASONE-SALMETEROL 250-50 MCG/DOSE IN AEPB
1.0000 | INHALATION_SPRAY | Freq: Two times a day (BID) | RESPIRATORY_TRACT | Status: DC
  Filled 2010-08-11: qty 14

## 2010-08-11 MED ORDER — METFORMIN HCL 1000 MG PO TABS: 1000.0000 mg | ORAL_TABLET | Freq: Two times a day (BID) | ORAL | Status: DC

## 2010-08-11 NOTE — Progress Notes (Signed)
08/11/10 1200 pt discharged home with husband this am. Reviewed discharge instructions with patient, given copy of instructions, med list, carenotes, f/u appointment information. Pt given prescriptions per dr Karilyn Cota. Pt verbalized understanding of instructions. IV site d/c'd per order, site within normal limits. Pt denied pain or discomfort prior to discharge. Pt left floor in stable condition via w/c accompanied by nurse tech. A. McCann,RN

## 2010-08-11 NOTE — Progress Notes (Signed)
[name] is currently on warfarin for a diagnosis of pulmonary embolism.  Current Labs: Hematology Lab Results  Component Value Date/Time   WBC 14.2* 08/11/2010  4:54 AM   RBC 4.77 08/11/2010  4:54 AM   HGB 12.7 08/11/2010  4:54 AM   HCT 39.6 08/11/2010  4:54 AM   MCV 83.0 08/11/2010  4:54 AM   MCH 26.6 08/11/2010  4:54 AM   Platelets  Date Value Range Status  08/11/2010 275  150-400 (K/uL) Final   No results found for this basename: PTT   INR  Date Value Range Status  08/11/2010 3.69* 0.00-1.49 (no units) Final    [name] is stable with regards to anticoagulation.  Warfarin will be started with a target INR of 2-3.  No warfarin today and follow INR.  Mady Gemma

## 2010-08-11 NOTE — Discharge Summary (Addendum)
Physician Discharge Summary  Patient ID: [name] MRN: [id number] DOB/AGE: February 21, 1950 60 y.o. Primary Care Physician:No primary provider on file. Admit date: 08/08/2010 Discharge date: 08/11/2010    Discharge Diagnoses:  1. Bronchitis with bronchospasm. 2. 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 3. Morbid obesity. 4. Hypertension. 5. History of pulmonary embolism, no new pulmonary embolism on this admission. 6. Hypothyroidism.   Current Discharge Medication List    START taking these medications   Details  amLODipine (NORVASC) 10 MG tablet Take 1 tablet (10 mg total) by mouth daily. Qty: 30 tablet, Refills: 0    Fluticasone-Salmeterol (ADVAIR) 250-50 MCG/DOSE AEPB Inhale 1 puff into the lungs 2 (two) times daily. Qty: 60 each, Refills: 0    glipiZIDE (GLUCOTROL) 5 MG tablet Take 1 tablet (5 mg total) by mouth 2 (two) times daily before a meal. Qty: 30 tablet, Refills: 0    ipratropium (ATROVENT) 0.02 % nebulizer solution Take 2.5 mLs (500 mcg total) by nebulization every 6 (six) hours. Qty: 75 mL, Refills: 0    metFORMIN (GLUCOPHAGE) 1000 MG tablet Take 1 tablet (1,000 mg total) by mouth 2 (two) times daily with a meal. Qty: 60 tablet, Refills: 0    predniSONE (DELTASONE) 20 MG tablet Take 2 tablets (40 mg total) by mouth daily before breakfast. Take 2 tabs daily for 4 days,then 1 tab daily for 4 days,then 1/2 tab daily for 4 days,then STOP. Qty: 15 tablet, Refills: 0      CONTINUE these medications which have NOT CHANGED   Details  albuterol (PROVENTIL) (2.5 MG/3ML) 0.083% nebulizer solution Take 2.5 mg by nebulization every 6 (six) hours as needed. For wheezing     atorvastatin (LIPITOR) 20 MG tablet Take 20 mg by mouth daily.      cetirizine (ZYRTEC) 10 MG tablet Take 10 mg by mouth daily.      esomeprazole (NEXIUM) 40 MG capsule Take 40 mg by mouth daily before breakfast.      Ferrous Sulfate (IRON) 325 (65 FE) MG TABS Take 1 tablet by mouth  daily.      HYDROcodone-acetaminophen (VICODIN) 5-500 MG per tablet Take 1 tablet by mouth every 6 (six) hours as needed. For pain     levothyroxine (SYNTHROID, LEVOTHROID) 112 MCG tablet Take 112 mcg by mouth daily.      meclizine (ANTIVERT) 25 MG tablet Take 25 mg by mouth daily.     olmesartan (BENICAR) 40 MG tablet Take 40 mg by mouth daily.      OVER THE COUNTER MEDICATION Apply 1 application topically 2 (two) times daily as needed. ANTIFUNGAL POWDER: For yeast.     saxagliptin HCl (ONGLYZA) 5 MG TABS tablet Take 5 mg by mouth daily.      triamterene-hydrochlorothiazide (DYAZIDE) 37.5-25 MG per capsule Take 1 capsule by mouth every morning.      !! warfarin (COUMADIN) 5 MG tablet Take 5 mg by mouth daily. Takes one 5mg  tablet every day and takes one 5mg  tablet + one 2mg  tablet on every 3rd day     !! warfarin (COUMADIN) 2 MG tablet Take 2 mg by mouth every 3 (three) days. Take one 2mg  tablet with one 5mg  tablet on every 3rd day for a total of 7mg  on every 3rd day      !! - Potential duplicate medications found. Please discuss with provider.    STOP taking these medications     glipiZIDE-metformin (METAGLIP) 2.5-500 MG per tablet      nystatin (MYCOSTATIN) powder  Discharged Condition: Stable and improved.    Consults: None.  Significant Diagnostic Studies: Ct Angio Chest W/cm &/or Wo Cm  08/09/2010  *RADIOLOGY REPORT*  Clinical Data:  History of pulmonary embolism, now with dyspnea concern for new pulmonary embolism  CT ANGIOGRAPHY CHEST WITH CONTRAST  Technique:  Multidetector CT imaging of the chest was performed using the standard protocol during bolus administration of intravenous contrast.  Multiplanar CT image reconstructions including MIPs were obtained to evaluate the vascular anatomy.  Contrast:  100 ml of Omnipaque 350  Comparison:  Chest radiograph 08/08/2010  Findings:  There is mild left and mild to moderate right bibasilar atelectasis.  There are no  pleural effusions.  There is no significant hilar or mediastinal adenopathy.  Reactive mediastinal lymph nodes are present.  There is atherosclerotic calcification of the aortic arch and of coronary arteries.  There is mild cardiomegaly.  There are no filling defects in the pulmonary arterial system to suggest embolism.  There is no acute musculoskeletal abnormality.  Review of the MIP images confirms the above findings.  IMPRESSION: Bilateral lower lobe atelectasis.  No evidence of pulmonary embolism.  Original Report Authenticated By: [id number]   Dg Chest Portable 1 View  08/08/2010  *RADIOLOGY REPORT*  Clinical Data: Short of breath, cough and wheeze  PORTABLE CHEST - 1 VIEW  Comparison: 01/03/2008  Findings: Mild cardiac enlargement with normal pulmonary vascularity.  No focal airspace consolidation in the lungs.  No blunting of costophrenic angles.  No pneumothorax.  Tortuous aorta.  IMPRESSION: No evidence of active pulmonary disease.  Cardiac enlargement.  Original Report Authenticated By: Marlon Pel, M.D.    Lab Results: Results for orders placed during the hospital encounter of 08/08/10 (from the past 24 hour(s))  GLUCOSE, CAPILLARY     Status: Abnormal   Collection Time   08/10/10 11:22 AM      Component Value Range   Glucose-Capillary 345 (*) 70 - 99 (mg/dL)  GLUCOSE, CAPILLARY     Status: Abnormal   Collection Time   08/10/10  4:27 PM      Component Value Range   Glucose-Capillary 300 (*) 70 - 99 (mg/dL)  URINALYSIS, ROUTINE W REFLEX MICROSCOPIC     Status: Abnormal   Collection Time   08/10/10  9:28 PM      Component Value Range   Color, Urine STRAW (*) YELLOW    Appearance CLEAR  CLEAR    Specific Gravity, Urine 1.010  1.005 - 1.030    pH 6.0  5.0 - 8.0    Glucose, UA NEGATIVE  NEGATIVE (mg/dL)   Hgb urine dipstick NEGATIVE  NEGATIVE    Bilirubin Urine NEGATIVE  NEGATIVE    Ketones, ur NEGATIVE  NEGATIVE (mg/dL)   Protein, ur NEGATIVE  NEGATIVE (mg/dL)   Urobilinogen,  UA 0.2  0.0 - 1.0 (mg/dL)   Nitrite NEGATIVE  NEGATIVE    Leukocytes, UA NEGATIVE  NEGATIVE   GLUCOSE, CAPILLARY     Status: Abnormal   Collection Time   08/10/10  9:34 PM      Component Value Range   Glucose-Capillary 276 (*) 70 - 99 (mg/dL)   Comment 1 Notify RN    PROTIME-INR     Status: Abnormal   Collection Time   08/11/10  4:54 AM      Component Value Range   Prothrombin Time 37.2 (*) 11.6 - 15.2 (seconds)   INR 3.69 (*) 0.00 - 1.49   CBC     Status: Abnormal  Collection Time   08/11/10  4:54 AM      Component Value Range   WBC 14.2 (*) 4.0 - 10.5 (K/uL)   RBC 4.77  3.87 - 5.11 (MIL/uL)   Hemoglobin 12.7  12.0 - 15.0 (g/dL)   HCT 09.8  11.9 - 14.7 (%)   MCV 83.0  78.0 - 100.0 (fL)   MCH 26.6  26.0 - 34.0 (pg)   MCHC 32.1  30.0 - 36.0 (g/dL)   RDW 82.9  56.2 - 13.0 (%)   Platelets 275  150 - 400 (K/uL)  COMPREHENSIVE METABOLIC PANEL     Status: Abnormal   Collection Time   08/11/10  4:54 AM      Component Value Range   Sodium 136  135 - 145 (mEq/L)   Potassium 3.7  3.5 - 5.1 (mEq/L)   Chloride 99  96 - 112 (mEq/L)   CO2 27  19 - 32 (mEq/L)   Glucose, Bld 278 (*) 70 - 99 (mg/dL)   BUN 36 (*) 6 - 23 (mg/dL)   Creatinine, Ser 8.65  0.50 - 1.10 (mg/dL)   Calcium 9.1  8.4 - 78.4 (mg/dL)   Total Protein 7.4  6.0 - 8.3 (g/dL)   Albumin 3.4 (*) 3.5 - 5.2 (g/dL)   AST 22  0 - 37 (U/L)   ALT 27  0 - 35 (U/L)   Alkaline Phosphatase 75  39 - 117 (U/L)   Total Bilirubin 0.2 (*) 0.3 - 1.2 (mg/dL)   GFR calc non Af Amer >60  >60 (mL/min)   GFR calc Af Amer >60  >60 (mL/min)  GLUCOSE, CAPILLARY     Status: Abnormal   Collection Time   08/11/10  7:28 AM      Component Value Range   Glucose-Capillary 241 (*) 70 - 99 (mg/dL)   Comment 1 Notify RN     Comment 2 Documented in Chart     No results found for this or any previous visit (from the past 240 hour(s)).   Hospital Course: This 60 year old lady was admitted with history of wheezing. Please see initial history and physical  examination. She had a nonproductive cough. It was felt that she had bronchitis with bronchospasm. She was started on intravenous steroids and this was then transitioned into oral steroids. She improved fairly quickly. However, she will need oral steroids in tapering fashion upon discharge. In view of her previous history of pulmonary embolism, a CT angiogram of the chest was done and this was negative for any new pulmonary emboli. The patient wants home oxygen. A chest x-ray nor the CT angiogram of the chest reveal any pneumonia during this hospitalization.  Discharge Exam: Blood pressure 138/82, pulse 85, temperature 98 F (36.7 C), temperature source Oral, resp. rate 21, height 5\' 1"  (1.549 m), weight 149.959 kg (330 lb 9.6 oz), SpO2 94.00%. Today she looks reasonably well. She does not have increased work of breathing. She still does have a few wheezes bilaterally but much significantly reduced than on admission. Heart sounds are present and normal.  Disposition: Home.  Discharge Orders    Future Orders Please Complete By Expires   Diet Carb Modified      Increase activity slowly         Follow-up Information    Follow up with HALL,ZACK. Make an appointment in 2 days.   Contact information:   [address] [place] [address] [phone]          Signed:  GOSRANI,NIMISH C 08/11/2010, 8:22 AM

## 2010-08-11 NOTE — Progress Notes (Signed)
08-11-10 2120  Patient c/o pain upon urination and burning.  Patient has redness to sacrum and abdominal folds and has irritation to perineal area.  Notified md of  Patient complaints.  Received order for ua and nystantin powder.  Will continue to monitor patient.  Bjohnson, rn

## 2010-09-05 IMAGING — CR DG ABDOMEN ACUTE W/ 1V CHEST
4 series · 4 of 4 positions shown · non-contrast
Comparison: None

CLINICAL DATA: Constipation

ACUTE ABDOMEN SERIES (ABDOMEN 2 VIEW & CHEST 1 VIEW)

[view not recorded (1 of 4)]
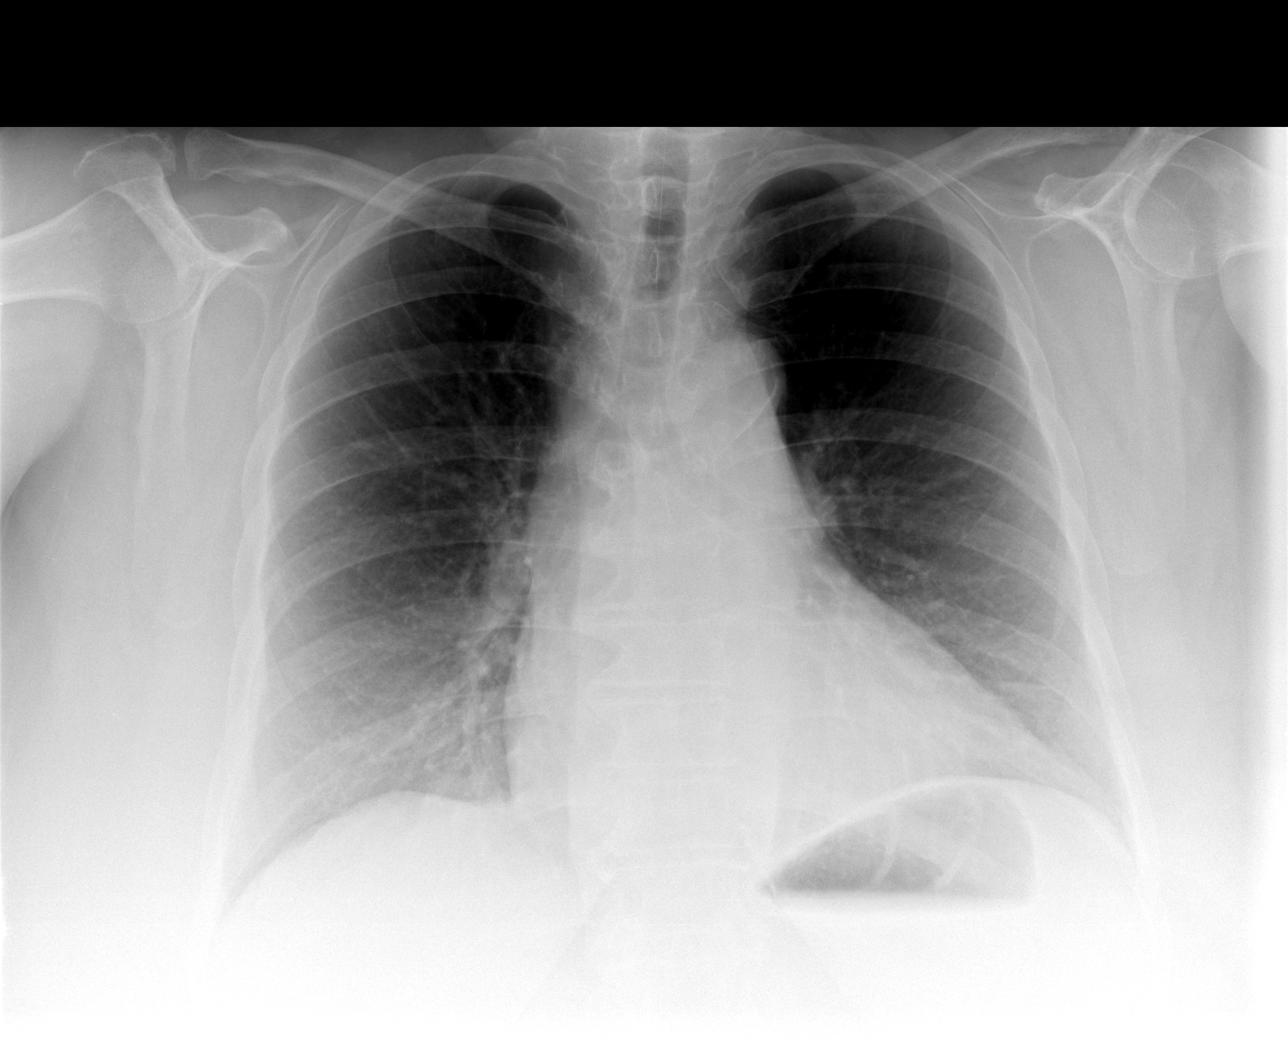

[view not recorded (2 of 4)]
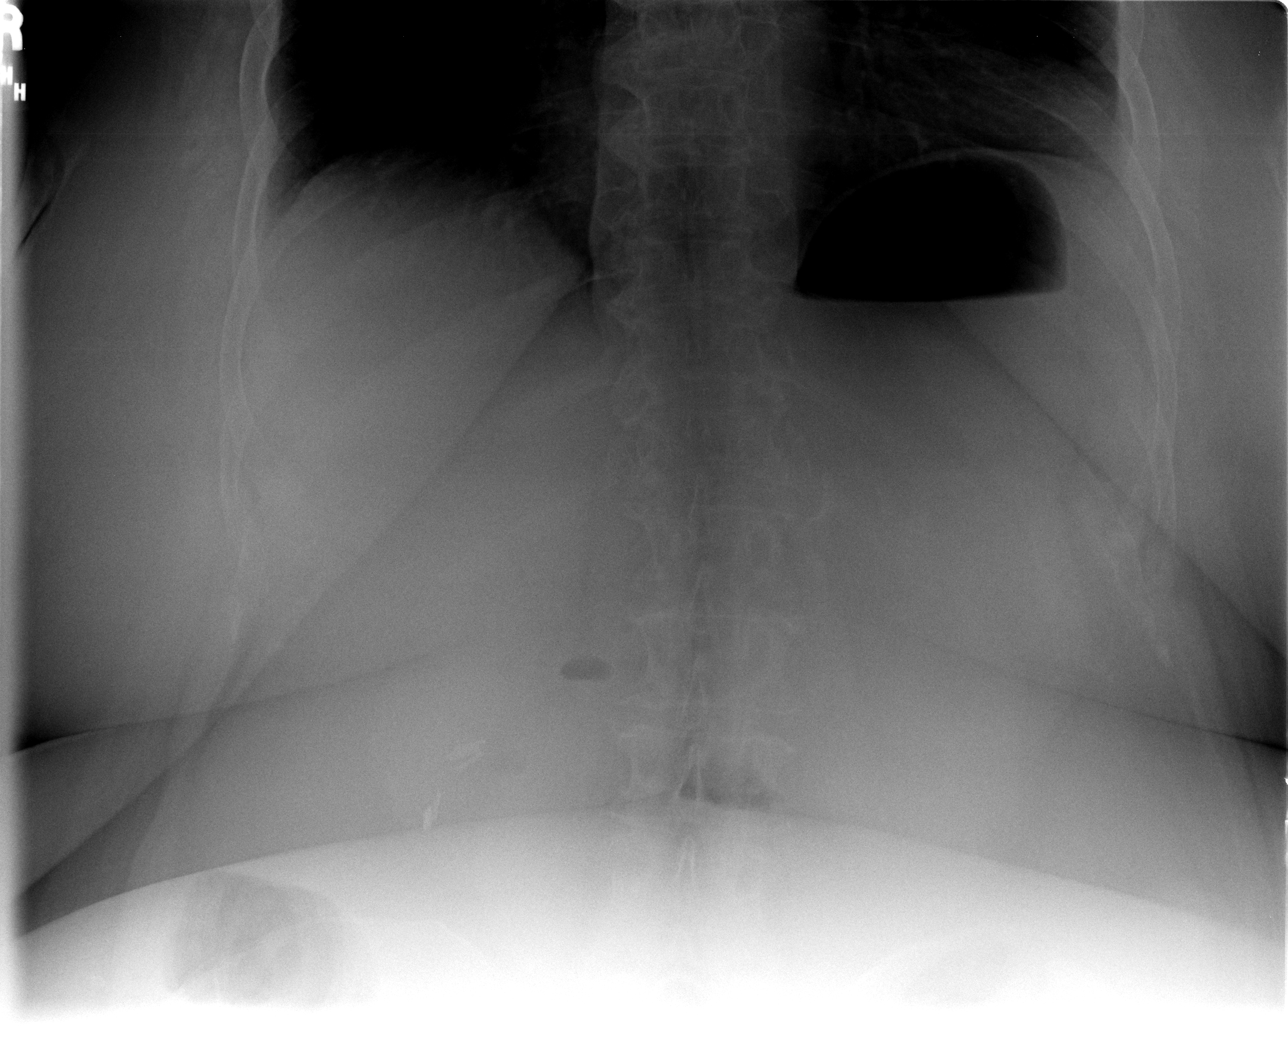

[view not recorded (3 of 4)]
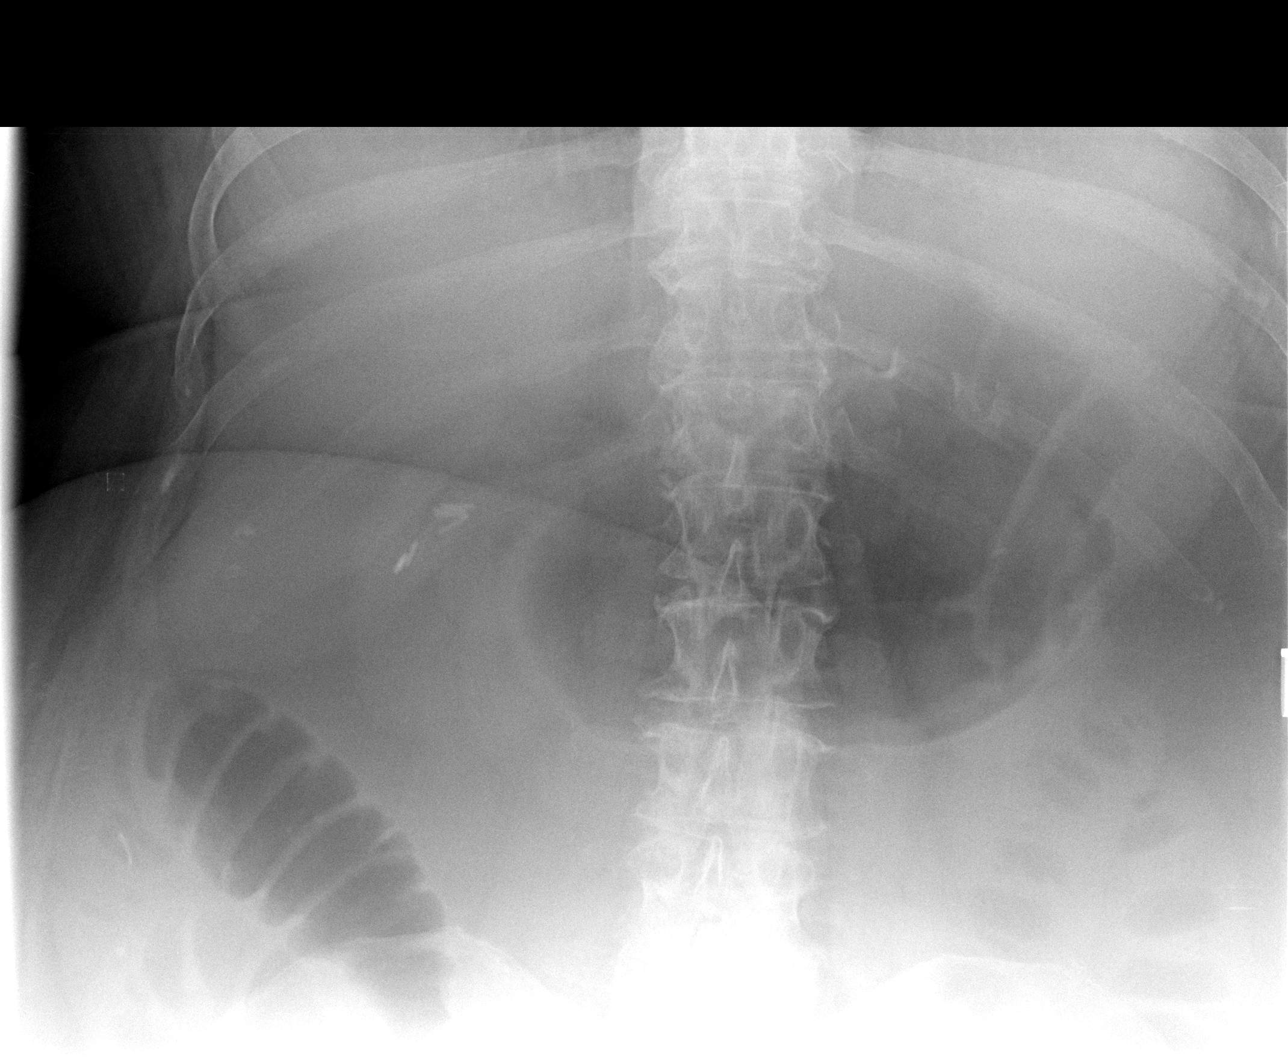

[view not recorded (4 of 4)]
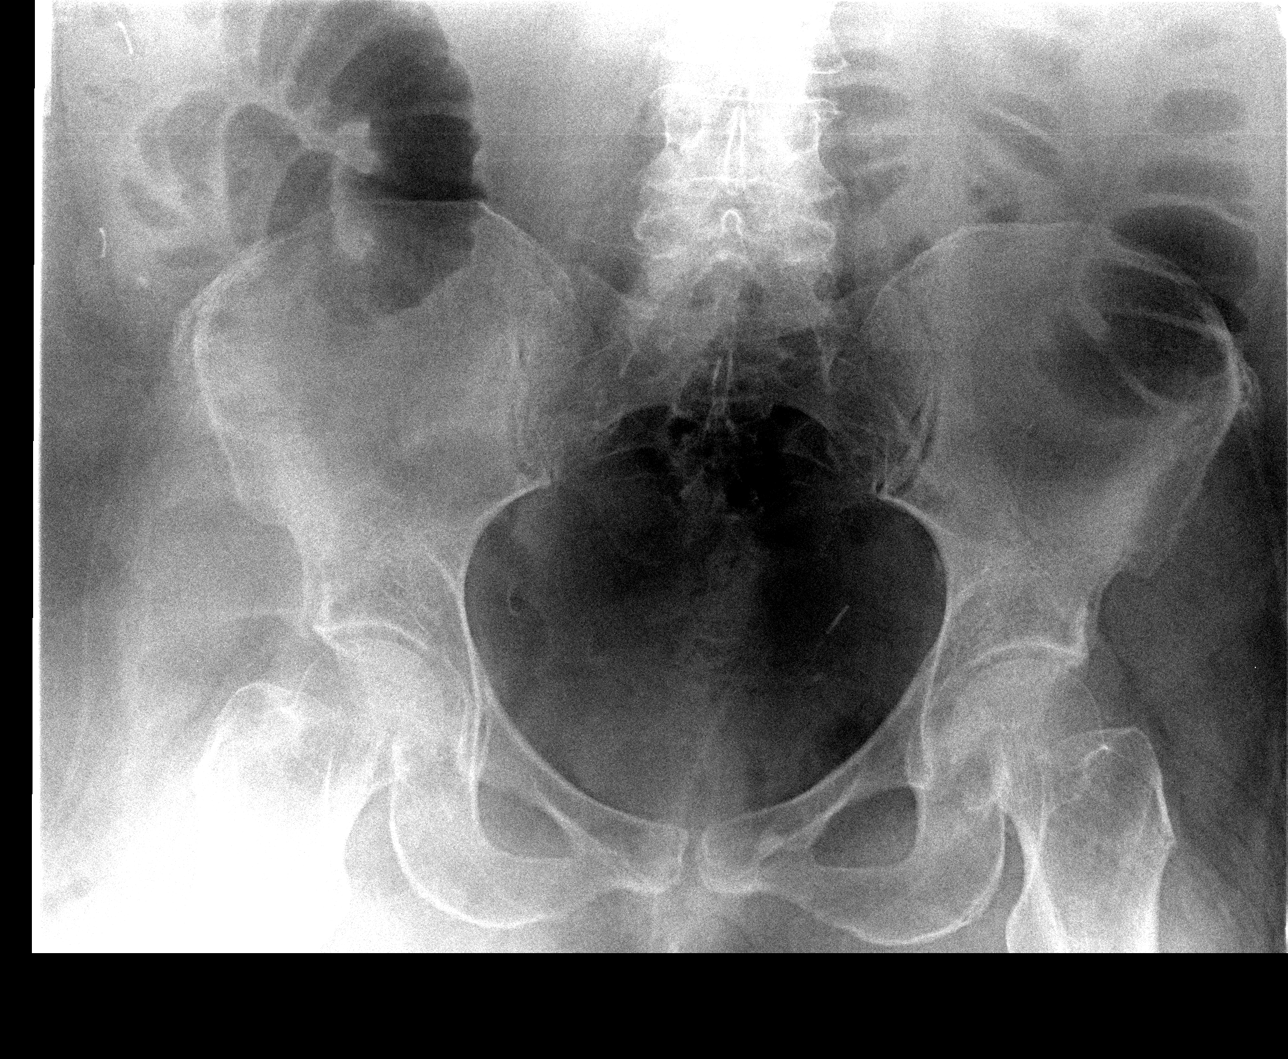

[4 of 4 positions shown; findings below may reference images not displayed]

FINDINGS: Borderline cardiomegaly.  No acute infiltrate or pleural
effusion.

No free abdominal air is noted.  Post cholecystectomy surgical
clips are noted.  Mild gaseous dilated bowel loops right lower
abdomen probable due to mild ileus.  No air fluid levels.
IMPRESSION: No acute disease within chest.  No free abdominal air.  Mild
gaseous dilated bowel loops right lower abdomen probable mild
ileus.

## 2010-09-17 NOTE — Progress Notes (Signed)
Encounter addended by: Angela Lilly on: 09/17/2010  5:25 PM<BR>     Documentation filed: Flowsheet VN

## 2010-10-31 LAB — GLUCOSE, CAPILLARY
Glucose-Capillary: 106 mg/dL — ABNORMAL HIGH (ref 70–99)
Glucose-Capillary: 106 mg/dL — ABNORMAL HIGH (ref 70–99)
Glucose-Capillary: 106 mg/dL — ABNORMAL HIGH (ref 70–99)
Glucose-Capillary: 107 mg/dL — ABNORMAL HIGH (ref 70–99)
Glucose-Capillary: 111 mg/dL — ABNORMAL HIGH (ref 70–99)
Glucose-Capillary: 112 mg/dL — ABNORMAL HIGH (ref 70–99)
Glucose-Capillary: 117 mg/dL — ABNORMAL HIGH (ref 70–99)
Glucose-Capillary: 120 mg/dL — ABNORMAL HIGH (ref 70–99)
Glucose-Capillary: 121 mg/dL — ABNORMAL HIGH (ref 70–99)
Glucose-Capillary: 125 mg/dL — ABNORMAL HIGH (ref 70–99)
Glucose-Capillary: 125 mg/dL — ABNORMAL HIGH (ref 70–99)
Glucose-Capillary: 125 mg/dL — ABNORMAL HIGH (ref 70–99)
Glucose-Capillary: 129 mg/dL — ABNORMAL HIGH (ref 70–99)
Glucose-Capillary: 132 mg/dL — ABNORMAL HIGH (ref 70–99)
Glucose-Capillary: 135 mg/dL — ABNORMAL HIGH (ref 70–99)
Glucose-Capillary: 135 mg/dL — ABNORMAL HIGH (ref 70–99)
Glucose-Capillary: 136 mg/dL — ABNORMAL HIGH (ref 70–99)
Glucose-Capillary: 138 mg/dL — ABNORMAL HIGH (ref 70–99)
Glucose-Capillary: 143 mg/dL — ABNORMAL HIGH (ref 70–99)
Glucose-Capillary: 149 mg/dL — ABNORMAL HIGH (ref 70–99)
Glucose-Capillary: 150 mg/dL — ABNORMAL HIGH (ref 70–99)
Glucose-Capillary: 159 mg/dL — ABNORMAL HIGH (ref 70–99)
Glucose-Capillary: 167 mg/dL — ABNORMAL HIGH (ref 70–99)
Glucose-Capillary: 171 mg/dL — ABNORMAL HIGH (ref 70–99)
Glucose-Capillary: 87 mg/dL (ref 70–99)
Glucose-Capillary: 94 mg/dL (ref 70–99)
Glucose-Capillary: 95 mg/dL (ref 70–99)
Glucose-Capillary: 95 mg/dL (ref 70–99)
Glucose-Capillary: 95 mg/dL (ref 70–99)
Glucose-Capillary: 96 mg/dL (ref 70–99)
Glucose-Capillary: 96 mg/dL (ref 70–99)
Glucose-Capillary: 98 mg/dL (ref 70–99)
Glucose-Capillary: 99 mg/dL (ref 70–99)
Glucose-Capillary: 99 mg/dL (ref 70–99)

## 2010-10-31 LAB — BASIC METABOLIC PANEL
BUN: 2 mg/dL — ABNORMAL LOW (ref 6–23)
BUN: 22 mg/dL (ref 6–23)
BUN: 7 mg/dL (ref 6–23)
BUN: 8 mg/dL (ref 6–23)
BUN: 9 mg/dL (ref 6–23)
CO2: 21 mEq/L (ref 19–32)
CO2: 26 mEq/L (ref 19–32)
CO2: 30 mEq/L (ref 19–32)
CO2: 33 mEq/L — ABNORMAL HIGH (ref 19–32)
Calcium: 7.1 mg/dL — ABNORMAL LOW (ref 8.4–10.5)
Calcium: 7.1 mg/dL — ABNORMAL LOW (ref 8.4–10.5)
Calcium: 7.4 mg/dL — ABNORMAL LOW (ref 8.4–10.5)
Calcium: 7.4 mg/dL — ABNORMAL LOW (ref 8.4–10.5)
Calcium: 7.6 mg/dL — ABNORMAL LOW (ref 8.4–10.5)
Calcium: 7.9 mg/dL — ABNORMAL LOW (ref 8.4–10.5)
Chloride: 103 mEq/L (ref 96–112)
Chloride: 106 mEq/L (ref 96–112)
Creatinine, Ser: 0.56 mg/dL (ref 0.4–1.2)
Creatinine, Ser: 0.6 mg/dL (ref 0.4–1.2)
Creatinine, Ser: 0.62 mg/dL (ref 0.4–1.2)
Creatinine, Ser: 0.68 mg/dL (ref 0.4–1.2)
Creatinine, Ser: 1.36 mg/dL — ABNORMAL HIGH (ref 0.4–1.2)
GFR calc Af Amer: 48 mL/min — ABNORMAL LOW (ref >60)
GFR calc Af Amer: >60 mL/min (ref >60)
GFR calc Af Amer: >60 mL/min (ref >60)
GFR calc non Af Amer: 40 mL/min — ABNORMAL LOW (ref >60)
GFR calc non Af Amer: >60 mL/min (ref >60)
GFR calc non Af Amer: >60 mL/min (ref >60)
GFR calc non Af Amer: >60 mL/min (ref >60)
GFR calc non Af Amer: >60 mL/min (ref >60)
Glucose, Bld: 108 mg/dL — ABNORMAL HIGH (ref 70–99)
Glucose, Bld: 128 mg/dL — ABNORMAL HIGH (ref 70–99)
Glucose, Bld: 150 mg/dL — ABNORMAL HIGH (ref 70–99)
Glucose, Bld: 164 mg/dL — ABNORMAL HIGH (ref 70–99)
Glucose, Bld: 84 mg/dL (ref 70–99)
Potassium: 3.1 mEq/L — ABNORMAL LOW (ref 3.5–5.1)
Potassium: 3.5 mEq/L (ref 3.5–5.1)
Potassium: 4.1 mEq/L (ref 3.5–5.1)
Sodium: 134 mEq/L — ABNORMAL LOW (ref 135–145)
Sodium: 136 mEq/L (ref 135–145)
Sodium: 138 mEq/L (ref 135–145)
Sodium: 140 mEq/L (ref 135–145)
Sodium: 142 mEq/L (ref 135–145)

## 2010-10-31 LAB — CBC
HCT: 28.1 % — ABNORMAL LOW (ref 36.0–46.0)
HCT: 29.2 % — ABNORMAL LOW (ref 36.0–46.0)
HCT: 29.6 % — ABNORMAL LOW (ref 36.0–46.0)
HCT: 30 % — ABNORMAL LOW (ref 36.0–46.0)
HCT: 37.5 % (ref 36.0–46.0)
HCT: 42.9 % (ref 36.0–46.0)
Hemoglobin: 10 g/dL — ABNORMAL LOW (ref 12.0–15.0)
Hemoglobin: 10.7 g/dL — ABNORMAL LOW (ref 12.0–15.0)
Hemoglobin: 11.7 g/dL — ABNORMAL LOW (ref 12.0–15.0)
Hemoglobin: 12.3 g/dL (ref 12.0–15.0)
Hemoglobin: 12.6 g/dL (ref 12.0–15.0)
Hemoglobin: 14.3 g/dL (ref 12.0–15.0)
Hemoglobin: 9.7 g/dL — ABNORMAL LOW (ref 12.0–15.0)
MCHC: 32.4 g/dL (ref 30.0–36.0)
MCHC: 32.6 g/dL (ref 30.0–36.0)
MCHC: 32.6 g/dL (ref 30.0–36.0)
MCHC: 32.7 g/dL (ref 30.0–36.0)
MCHC: 32.8 g/dL (ref 30.0–36.0)
MCHC: 32.9 g/dL (ref 30.0–36.0)
MCHC: 32.9 g/dL (ref 30.0–36.0)
MCHC: 33 g/dL (ref 30.0–36.0)
MCHC: 33.1 g/dL (ref 30.0–36.0)
MCV: 77.5 fL — ABNORMAL LOW (ref 78.0–100.0)
MCV: 78 fL (ref 78.0–100.0)
MCV: 78.4 fL (ref 78.0–100.0)
MCV: 78.8 fL (ref 78.0–100.0)
MCV: 79.3 fL (ref 78.0–100.0)
MCV: 82.5 fL (ref 78.0–100.0)
MCV: 82.6 fL (ref 78.0–100.0)
MCV: 83 fL (ref 78.0–100.0)
MCV: 85.1 fL (ref 78.0–100.0)
Platelets: 201 10*3/uL (ref 150–400)
Platelets: 204 10*3/uL (ref 150–400)
Platelets: 228 10*3/uL (ref 150–400)
Platelets: 244 10*3/uL (ref 150–400)
Platelets: 259 10*3/uL (ref 150–400)
Platelets: 328 10*3/uL (ref 150–400)
Platelets: 333 10*3/uL (ref 150–400)
RBC: 3.02 MIL/uL — ABNORMAL LOW (ref 3.87–5.11)
RBC: 3.4 MIL/uL — ABNORMAL LOW (ref 3.87–5.11)
RBC: 3.64 MIL/uL — ABNORMAL LOW (ref 3.87–5.11)
RBC: 4.52 MIL/uL (ref 3.87–5.11)
RBC: 4.81 MIL/uL (ref 3.87–5.11)
RBC: 4.9 MIL/uL (ref 3.87–5.11)
RBC: 5.61 MIL/uL — ABNORMAL HIGH (ref 3.87–5.11)
RDW: 16.6 % — ABNORMAL HIGH (ref 11.5–15.5)
RDW: 16.7 % — ABNORMAL HIGH (ref 11.5–15.5)
RDW: 16.8 % — ABNORMAL HIGH (ref 11.5–15.5)
RDW: 18 % — ABNORMAL HIGH (ref 11.5–15.5)
RDW: 18.3 % — ABNORMAL HIGH (ref 11.5–15.5)
RDW: 18.4 % — ABNORMAL HIGH (ref 11.5–15.5)
RDW: 18.6 % — ABNORMAL HIGH (ref 11.5–15.5)
RDW: 18.6 % — ABNORMAL HIGH (ref 11.5–15.5)
RDW: 18.7 % — ABNORMAL HIGH (ref 11.5–15.5)
WBC: 10.4 10*3/uL (ref 4.0–10.5)
WBC: 10.6 10*3/uL — ABNORMAL HIGH (ref 4.0–10.5)
WBC: 11.1 10*3/uL — ABNORMAL HIGH (ref 4.0–10.5)
WBC: 11.8 10*3/uL — ABNORMAL HIGH (ref 4.0–10.5)
WBC: 15.4 10*3/uL — ABNORMAL HIGH (ref 4.0–10.5)
WBC: 18.3 10*3/uL — ABNORMAL HIGH (ref 4.0–10.5)
WBC: 7.2 10*3/uL (ref 4.0–10.5)
WBC: 8.3 10*3/uL (ref 4.0–10.5)

## 2010-10-31 LAB — COMPREHENSIVE METABOLIC PANEL
ALT: 255 U/L — ABNORMAL HIGH (ref 0–35)
ALT: 28 U/L (ref 0–35)
ALT: 80 U/L — ABNORMAL HIGH (ref 0–35)
AST: 19 U/L (ref 0–37)
AST: 248 U/L — ABNORMAL HIGH (ref 0–37)
AST: 29 U/L (ref 0–37)
AST: 320 U/L — ABNORMAL HIGH (ref 0–37)
Albumin: 1.8 g/dL — ABNORMAL LOW (ref 3.5–5.2)
Albumin: 2 g/dL — ABNORMAL LOW (ref 3.5–5.2)
Albumin: 2.8 g/dL — ABNORMAL LOW (ref 3.5–5.2)
Albumin: 3.4 g/dL — ABNORMAL LOW (ref 3.5–5.2)
Alkaline Phosphatase: 43 U/L (ref 39–117)
Alkaline Phosphatase: 51 U/L (ref 39–117)
Alkaline Phosphatase: 56 U/L (ref 39–117)
Alkaline Phosphatase: 63 U/L (ref 39–117)
Alkaline Phosphatase: 71 U/L (ref 39–117)
BUN: 25 mg/dL — ABNORMAL HIGH (ref 6–23)
BUN: 53 mg/dL — ABNORMAL HIGH (ref 6–23)
BUN: 76 mg/dL — ABNORMAL HIGH (ref 6–23)
BUN: 80 mg/dL — ABNORMAL HIGH (ref 6–23)
CO2: 23 mEq/L (ref 19–32)
CO2: 24 mEq/L (ref 19–32)
CO2: 25 mEq/L (ref 19–32)
CO2: 25 mEq/L (ref 19–32)
CO2: 26 mEq/L (ref 19–32)
Calcium: 7.5 mg/dL — ABNORMAL LOW (ref 8.4–10.5)
Calcium: 7.5 mg/dL — ABNORMAL LOW (ref 8.4–10.5)
Chloride: 106 mEq/L (ref 96–112)
Chloride: 110 mEq/L (ref 96–112)
Chloride: 110 mEq/L (ref 96–112)
Chloride: 82 mEq/L — ABNORMAL LOW (ref 96–112)
Chloride: 84 mEq/L — ABNORMAL LOW (ref 96–112)
Creatinine, Ser: 0.7 mg/dL (ref 0.4–1.2)
Creatinine, Ser: 1.33 mg/dL — ABNORMAL HIGH (ref 0.4–1.2)
Creatinine, Ser: 2.05 mg/dL — ABNORMAL HIGH (ref 0.4–1.2)
GFR calc Af Amer: 49 mL/min — ABNORMAL LOW (ref >60)
GFR calc Af Amer: 56 mL/min — ABNORMAL LOW (ref >60)
GFR calc Af Amer: >60 mL/min (ref >60)
GFR calc non Af Amer: 25 mL/min — ABNORMAL LOW (ref >60)
GFR calc non Af Amer: 41 mL/min — ABNORMAL LOW (ref >60)
GFR calc non Af Amer: >60 mL/min (ref >60)
GFR calc non Af Amer: >60 mL/min (ref >60)
Glucose, Bld: 108 mg/dL — ABNORMAL HIGH (ref 70–99)
Glucose, Bld: 163 mg/dL — ABNORMAL HIGH (ref 70–99)
Glucose, Bld: 83 mg/dL (ref 70–99)
Potassium: 3.2 mEq/L — ABNORMAL LOW (ref 3.5–5.1)
Potassium: 3.6 mEq/L (ref 3.5–5.1)
Potassium: 3.7 mEq/L (ref 3.5–5.1)
Potassium: 4.1 mEq/L (ref 3.5–5.1)
Sodium: 140 mEq/L (ref 135–145)
Sodium: 141 mEq/L (ref 135–145)
Total Bilirubin: 0.4 mg/dL (ref 0.3–1.2)
Total Bilirubin: 0.4 mg/dL (ref 0.3–1.2)
Total Bilirubin: 0.6 mg/dL (ref 0.3–1.2)
Total Bilirubin: 0.8 mg/dL (ref 0.3–1.2)
Total Bilirubin: 0.8 mg/dL (ref 0.3–1.2)
Total Protein: 4 g/dL — ABNORMAL LOW (ref 6.0–8.3)
Total Protein: 5 g/dL — ABNORMAL LOW (ref 6.0–8.3)

## 2010-10-31 LAB — DIFFERENTIAL
Basophils Absolute: 0 10*3/uL (ref 0.0–0.1)
Basophils Absolute: 0 10*3/uL (ref 0.0–0.1)
Basophils Absolute: 0 10*3/uL (ref 0.0–0.1)
Basophils Absolute: 0 10*3/uL (ref 0.0–0.1)
Basophils Absolute: 0 10*3/uL (ref 0.0–0.1)
Basophils Absolute: 0 10*3/uL (ref 0.0–0.1)
Basophils Absolute: 0 10*3/uL (ref 0.0–0.1)
Basophils Absolute: 0 10*3/uL (ref 0.0–0.1)
Basophils Absolute: 0 10*3/uL (ref 0.0–0.1)
Basophils Absolute: 0 10*3/uL (ref 0.0–0.1)
Basophils Absolute: 0.1 10*3/uL (ref 0.0–0.1)
Basophils Relative: 0 % (ref 0–1)
Basophils Relative: 0 % (ref 0–1)
Basophils Relative: 0 % (ref 0–1)
Basophils Relative: 0 % (ref 0–1)
Basophils Relative: 0 % (ref 0–1)
Basophils Relative: 0 % (ref 0–1)
Basophils Relative: 0 % (ref 0–1)
Basophils Relative: 0 % (ref 0–1)
Basophils Relative: 0 % (ref 0–1)
Basophils Relative: 1 % (ref 0–1)
Basophils Relative: 1 % (ref 0–1)
Basophils Relative: 1 % (ref 0–1)
Eosinophils Absolute: 0 10*3/uL (ref 0.0–0.7)
Eosinophils Absolute: 0 10*3/uL (ref 0.0–0.7)
Eosinophils Absolute: 0 10*3/uL (ref 0.0–0.7)
Eosinophils Absolute: 0.1 10*3/uL (ref 0.0–0.7)
Eosinophils Absolute: 0.1 10*3/uL (ref 0.0–0.7)
Eosinophils Absolute: 0.1 10*3/uL (ref 0.0–0.7)
Eosinophils Absolute: 0.2 10*3/uL (ref 0.0–0.7)
Eosinophils Absolute: 0.2 10*3/uL (ref 0.0–0.7)
Eosinophils Absolute: 0.2 10*3/uL (ref 0.0–0.7)
Eosinophils Absolute: 0.2 10*3/uL (ref 0.0–0.7)
Eosinophils Relative: 0 % (ref 0–5)
Eosinophils Relative: 0 % (ref 0–5)
Eosinophils Relative: 1 % (ref 0–5)
Eosinophils Relative: 1 % (ref 0–5)
Eosinophils Relative: 2 % (ref 0–5)
Eosinophils Relative: 2 % (ref 0–5)
Eosinophils Relative: 3 % (ref 0–5)
Lymphocytes Relative: 10 % — ABNORMAL LOW (ref 12–46)
Lymphocytes Relative: 13 % (ref 12–46)
Lymphocytes Relative: 13 % (ref 12–46)
Lymphocytes Relative: 19 % (ref 12–46)
Lymphocytes Relative: 9 % — ABNORMAL LOW (ref 12–46)
Lymphs Abs: 1.2 10*3/uL (ref 0.7–4.0)
Lymphs Abs: 1.4 10*3/uL (ref 0.7–4.0)
Lymphs Abs: 1.4 10*3/uL (ref 0.7–4.0)
Lymphs Abs: 1.7 10*3/uL (ref 0.7–4.0)
Lymphs Abs: 1.8 10*3/uL (ref 0.7–4.0)
Monocytes Absolute: 0.9 10*3/uL (ref 0.1–1.0)
Monocytes Absolute: 1.1 10*3/uL — ABNORMAL HIGH (ref 0.1–1.0)
Monocytes Absolute: 1.1 10*3/uL — ABNORMAL HIGH (ref 0.1–1.0)
Monocytes Absolute: 1.2 10*3/uL — ABNORMAL HIGH (ref 0.1–1.0)
Monocytes Absolute: 1.2 10*3/uL — ABNORMAL HIGH (ref 0.1–1.0)
Monocytes Absolute: 1.3 10*3/uL — ABNORMAL HIGH (ref 0.1–1.0)
Monocytes Absolute: 1.3 10*3/uL — ABNORMAL HIGH (ref 0.1–1.0)
Monocytes Relative: 10 % (ref 3–12)
Monocytes Relative: 11 % (ref 3–12)
Monocytes Relative: 12 % (ref 3–12)
Monocytes Relative: 13 % — ABNORMAL HIGH (ref 3–12)
Monocytes Relative: 14 % — ABNORMAL HIGH (ref 3–12)
Neutro Abs: 13 10*3/uL — ABNORMAL HIGH (ref 1.7–7.7)
Neutro Abs: 18.5 10*3/uL — ABNORMAL HIGH (ref 1.7–7.7)
Neutro Abs: 4.1 10*3/uL (ref 1.7–7.7)
Neutro Abs: 4.4 10*3/uL (ref 1.7–7.7)
Neutro Abs: 6.6 10*3/uL (ref 1.7–7.7)
Neutro Abs: 7.7 10*3/uL (ref 1.7–7.7)
Neutro Abs: 7.7 10*3/uL (ref 1.7–7.7)
Neutro Abs: 8.9 10*3/uL — ABNORMAL HIGH (ref 1.7–7.7)
Neutro Abs: 9.3 10*3/uL — ABNORMAL HIGH (ref 1.7–7.7)
Neutrophils Relative %: 58 % (ref 43–77)
Neutrophils Relative %: 63 % (ref 43–77)
Neutrophils Relative %: 65 % (ref 43–77)
Neutrophils Relative %: 67 % (ref 43–77)
Neutrophils Relative %: 74 % (ref 43–77)
Neutrophils Relative %: 74 % (ref 43–77)
Neutrophils Relative %: 75 % (ref 43–77)
Neutrophils Relative %: 79 % — ABNORMAL HIGH (ref 43–77)
Neutrophils Relative %: 84 % — ABNORMAL HIGH (ref 43–77)
WBC Morphology: INCREASED

## 2010-10-31 LAB — CULTURE, BLOOD (ROUTINE X 2)
Culture: NO GROWTH
Report Status: 12132009
Report Status: 12132009

## 2010-10-31 LAB — HEMOGLOBIN AND HEMATOCRIT, BLOOD
HCT: 24.1 % — ABNORMAL LOW (ref 36.0–46.0)
HCT: 25.2 % — ABNORMAL LOW (ref 36.0–46.0)
HCT: 26.7 % — ABNORMAL LOW (ref 36.0–46.0)
Hemoglobin: 8.1 g/dL — ABNORMAL LOW (ref 12.0–15.0)
Hemoglobin: 8.8 g/dL — ABNORMAL LOW (ref 12.0–15.0)
Hemoglobin: 8.9 g/dL — ABNORMAL LOW (ref 12.0–15.0)

## 2010-10-31 LAB — CROSSMATCH
ABO/RH(D): A POS
ABO/RH(D): A POS
Antibody Screen: NEGATIVE

## 2010-10-31 LAB — HEPATIC FUNCTION PANEL
AST: 34 U/L (ref 0–37)
Albumin: 1.9 g/dL — ABNORMAL LOW (ref 3.5–5.2)
Total Bilirubin: 0.4 mg/dL (ref 0.3–1.2)

## 2010-10-31 LAB — FACTOR 5 LEIDEN

## 2010-10-31 LAB — PHOSPHORUS
Phosphorus: 2 mg/dL — ABNORMAL LOW (ref 2.3–4.6)
Phosphorus: 3.8 mg/dL (ref 2.3–4.6)

## 2010-10-31 LAB — TYPE AND SCREEN: Antibody Screen: NEGATIVE

## 2010-10-31 LAB — BETA-2-GLYCOPROTEIN I ABS, IGG/M/A
Beta-2 Glyco I IgG: 4 U/mL (ref <20)
Beta-2-Glycoprotein I IgM: <4 U/mL (ref <10)

## 2010-10-31 LAB — PROTIME-INR
INR: 1.7 — ABNORMAL HIGH (ref 0.00–1.49)
INR: 2.9 — ABNORMAL HIGH (ref 0.00–1.49)
INR: 4.5 — ABNORMAL HIGH (ref 0.00–1.49)
Prothrombin Time: 20.7 seconds — ABNORMAL HIGH (ref 11.6–15.2)

## 2010-10-31 LAB — HOMOCYSTEINE: Homocysteine: 7.9 umol/L (ref 4.0–15.4)

## 2010-10-31 LAB — PROTHROMBIN GENE MUTATION

## 2010-10-31 LAB — ABO/RH: ABO/RH(D): A POS

## 2010-10-31 LAB — POTASSIUM: Potassium: 4.1 mEq/L (ref 3.5–5.1)

## 2010-10-31 LAB — URINALYSIS, ROUTINE W REFLEX MICROSCOPIC
Glucose, UA: NEGATIVE mg/dL
Glucose, UA: NEGATIVE mg/dL
Leukocytes, UA: NEGATIVE
Nitrite: POSITIVE — AB
pH: 5 (ref 5.0–8.0)
pH: 5.5 (ref 5.0–8.0)

## 2010-10-31 LAB — APTT: aPTT: 41 seconds — ABNORMAL HIGH (ref 24–37)

## 2010-10-31 LAB — CARDIOLIPIN ANTIBODIES, IGG, IGM, IGA: Anticardiolipin IgM: <7 [MPL'U] — ABNORMAL LOW (ref <10)

## 2010-10-31 LAB — LUPUS ANTICOAGULANT PANEL
DRVVT: 49.6 secs — ABNORMAL HIGH (ref 36.1–47.0)
PTT Lupus Anticoagulant: 60.6 secs — ABNORMAL HIGH (ref 36.3–48.8)
PTTLA Confirmation: 9 secs — ABNORMAL HIGH (ref <8.0)
dRVVT Incubated 1:1 Mix: 39.2 secs (ref 36.1–47.0)

## 2010-10-31 LAB — PROTEIN S, TOTAL: Protein S Ag, Total: 56 % — ABNORMAL LOW (ref 70–140)

## 2010-10-31 LAB — MAGNESIUM
Magnesium: 1.5 mg/dL (ref 1.5–2.5)
Magnesium: 2.7 mg/dL — ABNORMAL HIGH (ref 1.5–2.5)
Magnesium: 3.8 mg/dL — ABNORMAL HIGH (ref 1.5–2.5)

## 2010-10-31 LAB — URINE CULTURE

## 2010-10-31 LAB — URINE MICROSCOPIC-ADD ON

## 2010-10-31 LAB — HEMOGLOBIN A1C: Hgb A1c MFr Bld: 5.9 % (ref 4.6–6.1)

## 2010-10-31 LAB — PROTEIN S ACTIVITY: Protein S Activity: 47 % — ABNORMAL LOW (ref 69–129)

## 2010-10-31 LAB — ANTITHROMBIN III: AntiThromb III Func: 107 % (ref 76–126)

## 2011-01-12 ENCOUNTER — Encounter (HOSPITAL_COMMUNITY): Payer: Self-pay | Admitting: Pharmacy Technician

## 2011-01-21 ENCOUNTER — Encounter (HOSPITAL_COMMUNITY)
Admission: RE | Admit: 2011-01-21 | Discharge: 2011-01-21 | Disposition: A | Discharge status: Home / Self Care | Payer: 59 | Source: Ambulatory Visit | Attending: Ophthalmology | Admitting: Ophthalmology

## 2011-01-21 ENCOUNTER — Encounter (HOSPITAL_COMMUNITY): Payer: Self-pay

## 2011-01-21 LAB — CBC
HCT: 40.4 % (ref 36.0–46.0)
MCH: 26.5 pg (ref 26.0–34.0)
MCHC: 32.4 g/dL (ref 30.0–36.0)
MCV: 81.6 fL (ref 78.0–100.0)
Platelets: 233 10*3/uL (ref 150–400)
RDW: 14.9 % (ref 11.5–15.5)
WBC: 8.5 10*3/uL (ref 4.0–10.5)

## 2011-01-21 LAB — BASIC METABOLIC PANEL
BUN: 33 mg/dL — ABNORMAL HIGH (ref 6–23)
Calcium: 10.1 mg/dL (ref 8.4–10.5)
Chloride: 102 mEq/L (ref 96–112)
Creatinine, Ser: 0.95 mg/dL (ref 0.50–1.10)
GFR calc Af Amer: 73 mL/min — ABNORMAL LOW (ref >90)

## 2011-01-21 NOTE — Patient Instructions (Addendum)
[name]  01/21/2011   Your procedure is scheduled on:  01/29/2011  Report to [facility] at 1000 AM  Call this number if you have problems the morning of surgery: [phone]   Remember:   Do not eat food:After Midnight.  May have clear liquids:until Midnight .  Clear liquids include soda, tea, black coffee, apple or grape juice, broth.  Take these medicines the morning of surgery with A SIP OF WATER: Amlodipine,zyrtec,nexium,vicodin,xyzal,levothyroxine,meclizine,benicar,triamterene. Take advair,albuterol and atrovent before you come.   Do not wear jewelry, make-up or nail polish.  Do not wear lotions, powders, or perfumes. You may wear deodorant.  Do not shave 48 hours prior to surgery.  Do not bring valuables to the hospital.  Contacts, dentures or bridgework may not be worn into surgery.  Leave suitcase in the car. After surgery it may be brought to your room.  For patients admitted to the hospital, checkout time is 11:00 AM the day of discharge.   Patients discharged the day of surgery will not be allowed to drive home.  Name and phone number of your driver: family  Special Instructions: N/A   Please read over the following fact sheets that you were given: Pain Booklet, Surgical Site Infection Prevention, Anesthesia Post-op Instructions and Care and Recovery After Surgery Cataract A cataract is a clouding of the lens of the eye. It is most often related to aging. A cataract is not a "film" over the surface of the eye. The lens is inside the eye and changes size of the pupil. The lens can enlarge to let more light enter the eye in dark environments and contract the size of the pupil to let in bright light. The lens is the part of the eye that helps focus light on the retina. The retina is the eye's light-sensitive layer. It is in the back of the eye that sends visual signals to the brain. In a normal eye, light passes through the lens and gets focused on the retina. To help  produce a sharp image, the lens must remain clear. When a lens becomes cloudy, vision is compromised by the degree and nature of the clouding. Certain cataracts make people more near-sighted as they develop, others increase glare, and all reduce vision to some degree or another. A cataract that is so dense that it becomes milky white and a white opacity can be seen through the pupil. When the white color is seen, it is called a "mature" or "hyper-mature cataract." Such cataracts cause total blindness in the affected eye. The cataract must be removed to prevent damage to the eye itself. Some types of cataracts can cause a secondary disease of the eye, such as certain types of glaucoma. In the early stages, better lighting and eyeglasses may lessen vision problems caused by cataracts. At a certain point, surgery may be needed to improve vision. CAUSES   Aging. However, cataracts may occur at any age, even in newborns.   Certain drugs.   Trauma to the eye.   Certain diseases (such as diabetes).   Inherited or acquired medical syndromes.  SYMPTOMS   Gradual, progressive drop in vision in the affected eye. Cataracts may develop at different rates in each eye. Cataracts may even be in just one eye with the other unaffected.   Cataracts due to trauma may develop quickly, sometimes over a matter or days or even hours. The result is severe and rapid visual loss.  DIAGNOSIS  To detect a cataract, an  eye doctor examines the lens. A well developed cataract can be diagnosed without dilating the pupil. Early cataracts and others of a specific nature are best diagnosed with an exam of the eyes with the pupils dilated by drops. TREATMENT   For an early cataract, vision may improve by using different eyeglasses or stronger lighting.   If the above measures do not help, surgery is the only effective treatment. This treatment removes the cloudy lens and replaces it with a substitute lens (Intraocular lens, or  IOL). Newly developed IOL technology allows the implanted lens to improve vision both at a distance and up close. Discuss with your eye surgeon about the possibility of still needing glasses. Also discuss how visual coordination between both eyes will be affected.  A cataract needs to be removed only when vision loss interferes with your everyday activities such as driving, reading or watching TV. You and your eye doctor can make that decision together. In most cases, waiting until you are ready to have cataract surgery will not harm your eye. If you have cataracts in both eyes, only one should be removed at a time. This allows the operated eye to heal and be out of danger from serious problems (such as infection or poor wound healing) before having the other eye undergo surgery.  Sometimes, a cataract should be removed even if it does not cause problems with your vision. For example, a cataract should be removed if it prevents examination or treatment of another eye problem. Just as you cannot see out of the affected eye well, your doctor cannot see into your eye well through a cataract. The vast majority of people who have cataract surgery have better vision afterward. CATARACT REMOVAL There are two primary ways to remove a cataract. Your doctor can explain the differences and help determine which is best for you:  Phacoemulsification (small incision cataract surgery). This involves making a small cut (incision) on the edge of the clear, dome-shaped surface that covers the front of the eye (the cornea). An injection behind the eye or eye drops are given to make this a painless procedure. The doctor then inserts a tiny probe into the eye. This device emits ultrasound waves that soften and break up the cloudy center of the lens so it can be removed by suction. Most cataract surgery is done this way. The cuts are usually so small and performed in such a manner that often no sutures are needed to keep it  closed.   Extracapsular surgery. Your doctor makes a slightly longer incision on the side of the cornea. The doctor removes the hard center of the lens. The remainder of the lens is then removed by suction. In some cases, extremely fine sutures are needed which the doctor may, or may not remove in the office after the surgery.  When an IOL is implanted, it needs no care. It becomes a permanent part of your eye and cannot be seen or felt.  Some people cannot have an IOL. They may have problems during surgery, or maybe they have another eye disease. For these people, a soft contact lens may be suggested. If an IOL or contact lens cannot be used, very powerful and thick glasses are required after surgery. Since vision is very different through such thick glasses, it is important to have your doctor discuss the impact on your vision after any cataract surgery where there is no plan to implant an IOL. The normal lens of the eye is covered  by a clear capsule. Both phacoemulsification and extracapsular surgery require that the back surface of this lens capsule be left in place. This helps support IOLs and prevents the IOL from dislocating and falling back into the deeper interior of the eye. Right after surgery, and often permanently this "posterior capsule" remains clear. In some cases however, it can become cloudy, presenting the same type of visual compromise that the original cataract did since light is again obstructed as it passes through the clear IOL. This condition is often referred to as an "after-cataract." Fortunately, after-cataracts are easily treated using a painless and very fast laser treatment that is performed without anesthesia or incisions. It is done in a matter of minutes in an outpatient environment. Visual improvement is often immediate.  HOME CARE INSTRUCTIONS   Your surgeon will discuss pre and post operative care with you prior to surgery. The majority of people are able to do almost all  normal activities right away. Although, it is often advised to avoid strenuous activity for a period of time.   Postoperative drops and careful avoidance of infection will be needed. Many surgeons suggest the use of a protective shield during the first few days after surgery.   There is a very small incidence of complication from modern cataract surgery, but it can happen. Infection that spreads to the inside of the eye (endophthalmitis) can result in total visual loss and even loss of the eye itself. In extremely rare instances, the inflammation of endophthalmitis can spread to both eyes (sympathetic ophthalmia). Appropriate post-operative care under the close observation of your surgeon is essential to a successful outcome.  SEEK IMMEDIATE MEDICAL CARE IF:   You have any sudden drop of vision in the operated eye.   You have pain in the operated eye.   You see a large number of floating dots in the field of vision in the operated eye.   You see flashing lights, or if a portion of your side vision in any direction appears black (like a curtain being drawn into your field of vision) in the operated eye.  Document Released: 01/12/2005 Document Revised: 09/24/2010 Document Reviewed: 02/28/2007 [facility] Patient Information 2012 [place], [place].PATIENT INSTRUCTIONS POST-ANESTHESIA  IMMEDIATELY FOLLOWING SURGERY:  Do not drive or operate machinery for the first twenty four hours after surgery.  Do not make any important decisions for twenty four hours after surgery or while taking narcotic pain medications or sedatives.  If you develop intractable nausea and vomiting or a severe headache please notify your doctor immediately.  FOLLOW-UP:  Please make an appointment with your surgeon as instructed. You do not need to follow up with anesthesia unless specifically instructed to do so.  WOUND CARE INSTRUCTIONS (if applicable):  Keep a dry clean dressing on the anesthesia/puncture wound site if there is  drainage.  Once the wound has quit draining you may leave it open to air.  Generally you should leave the bandage intact for twenty four hours unless there is drainage.  If the epidural site drains for more than 36-48 hours please call the anesthesia department.  QUESTIONS?:  Please feel free to call your physician or the hospital operator if you have any questions, and they will be happy to assist you.     [facility] Anesthesia Department [address] [place] [phone]

## 2011-01-29 ENCOUNTER — Encounter (HOSPITAL_COMMUNITY): Payer: Self-pay | Admitting: Anesthesiology

## 2011-01-29 ENCOUNTER — Encounter (HOSPITAL_COMMUNITY): Payer: Self-pay | Admitting: *Deleted

## 2011-01-29 ENCOUNTER — Encounter (HOSPITAL_COMMUNITY)
Admission: RE | Disposition: A | Discharge status: Home / Self Care | Payer: Self-pay | Source: Ambulatory Visit | Attending: Ophthalmology

## 2011-01-29 ENCOUNTER — Ambulatory Visit (HOSPITAL_COMMUNITY): Payer: 59 | Admitting: Anesthesiology

## 2011-01-29 ENCOUNTER — Ambulatory Visit (HOSPITAL_COMMUNITY)
Admission: RE | Admit: 2011-01-29 | Discharge: 2011-01-29 | Disposition: A | Discharge status: Home / Self Care | Payer: 59 | Source: Ambulatory Visit | Attending: Ophthalmology | Admitting: Ophthalmology

## 2011-01-29 DIAGNOSIS — Z79899 Other long term (current) drug therapy: Secondary | ICD-10-CM | POA: Insufficient documentation

## 2011-01-29 DIAGNOSIS — Z01812 Encounter for preprocedural laboratory examination: Secondary | ICD-10-CM | POA: Insufficient documentation

## 2011-01-29 DIAGNOSIS — I10 Essential (primary) hypertension: Secondary | ICD-10-CM | POA: Insufficient documentation

## 2011-01-29 DIAGNOSIS — Z7901 Long term (current) use of anticoagulants: Secondary | ICD-10-CM | POA: Insufficient documentation

## 2011-01-29 DIAGNOSIS — E119 Type 2 diabetes mellitus without complications: Secondary | ICD-10-CM | POA: Insufficient documentation

## 2011-01-29 DIAGNOSIS — J449 Chronic obstructive pulmonary disease, unspecified: Secondary | ICD-10-CM | POA: Insufficient documentation

## 2011-01-29 DIAGNOSIS — J4489 Other specified chronic obstructive pulmonary disease: Secondary | ICD-10-CM | POA: Insufficient documentation

## 2011-01-29 DIAGNOSIS — H2589 Other age-related cataract: Secondary | ICD-10-CM | POA: Insufficient documentation

## 2011-01-29 HISTORY — PX: CATARACT EXTRACTION W/PHACO: SHX586

## 2011-01-29 SURGERY — PHACOEMULSIFICATION, CATARACT, WITH IOL INSERTION
Anesthesia: Monitor Anesthesia Care | Site: Eye | Laterality: Left | Wound class: Clean

## 2011-01-29 MED ORDER — CYCLOPENTOLATE-PHENYLEPHRINE 0.2-1 % OP SOLN
OPHTHALMIC | Status: AC
  Administered 2011-01-29: 1 [drp] via OPHTHALMIC
  Filled 2011-01-29: qty 2

## 2011-01-29 MED ORDER — FENTANYL CITRATE 0.05 MG/ML IJ SOLN
INTRAMUSCULAR | Status: DC | PRN
  Administered 2011-01-29 (×2): 50 ug via INTRAVENOUS

## 2011-01-29 MED ORDER — EPINEPHRINE HCL 1 MG/ML IJ SOLN
INTRAMUSCULAR | Status: AC
  Filled 2011-01-29: qty 1

## 2011-01-29 MED ORDER — EPINEPHRINE HCL 1 MG/ML IJ SOLN
INTRAOCULAR | Status: DC | PRN
  Administered 2011-01-29: 12:00:00

## 2011-01-29 MED ORDER — ONDANSETRON HCL 4 MG/2ML IJ SOLN: 4.0000 mg | Freq: Once | INTRAMUSCULAR | Status: DC | PRN

## 2011-01-29 MED ORDER — NEOMYCIN-POLYMYXIN-DEXAMETH 3.5-10000-0.1 OP OINT
TOPICAL_OINTMENT | OPHTHALMIC | Status: AC
  Filled 2011-01-29: qty 3.5

## 2011-01-29 MED ORDER — LIDOCAINE HCL 3.5 % OP GEL
OPHTHALMIC | Status: AC
  Administered 2011-01-29: 1 via OPHTHALMIC
  Filled 2011-01-29: qty 5

## 2011-01-29 MED ORDER — MIDAZOLAM HCL 2 MG/2ML IJ SOLN
INTRAMUSCULAR | Status: AC
  Filled 2011-01-29: qty 2

## 2011-01-29 MED ORDER — LIDOCAINE HCL (PF) 1 % IJ SOLN
INTRAMUSCULAR | Status: AC
  Filled 2011-01-29: qty 2

## 2011-01-29 MED ORDER — POVIDONE-IODINE 5 % OP SOLN
OPHTHALMIC | Status: DC | PRN
  Administered 2011-01-29: 1 via OPHTHALMIC

## 2011-01-29 MED ORDER — FENTANYL CITRATE 0.05 MG/ML IJ SOLN: 25.0000 ug | INTRAMUSCULAR | Status: DC | PRN

## 2011-01-29 MED ORDER — TETRACAINE HCL 0.5 % OP SOLN
OPHTHALMIC | Status: AC
  Administered 2011-01-29: 1 [drp] via OPHTHALMIC
  Filled 2011-01-29: qty 2

## 2011-01-29 MED ORDER — LACTATED RINGERS IV SOLN
INTRAVENOUS | Status: DC
  Administered 2011-01-29: 1000 mL via INTRAVENOUS

## 2011-01-29 MED ORDER — CARBACHOL 0.01 % IO SOLN
INTRAOCULAR | Status: AC
  Filled 2011-01-29: qty 1.5

## 2011-01-29 MED ORDER — LIDOCAINE HCL 3.5 % OP GEL
1.0000 "application " | Freq: Once | OPHTHALMIC | Status: AC
  Administered 2011-01-29: 1 via OPHTHALMIC

## 2011-01-29 MED ORDER — PHENYLEPHRINE HCL 2.5 % OP SOLN
OPHTHALMIC | Status: AC
  Administered 2011-01-29: 1 [drp] via OPHTHALMIC
  Filled 2011-01-29: qty 2

## 2011-01-29 MED ORDER — NEOMYCIN-POLYMYXIN-DEXAMETH 0.1 % OP OINT
TOPICAL_OINTMENT | OPHTHALMIC | Status: DC | PRN
  Administered 2011-01-29: 1 via OPHTHALMIC

## 2011-01-29 MED ORDER — CYCLOPENTOLATE-PHENYLEPHRINE 0.2-1 % OP SOLN
1.0000 [drp] | OPHTHALMIC | Status: AC
  Administered 2011-01-29 (×3): 1 [drp] via OPHTHALMIC

## 2011-01-29 MED ORDER — LIDOCAINE 3.5 % OP GEL OPTIME - NO CHARGE
OPHTHALMIC | Status: DC | PRN
  Administered 2011-01-29: 1 [drp] via OPHTHALMIC

## 2011-01-29 MED ORDER — PROVISC 10 MG/ML IO SOLN
INTRAOCULAR | Status: DC | PRN
  Administered 2011-01-29: 8.5 mg via INTRAOCULAR

## 2011-01-29 MED ORDER — LACTATED RINGERS IV SOLN
INTRAVENOUS | Status: DC | PRN
  Administered 2011-01-29: 11:00:00 via INTRAVENOUS

## 2011-01-29 MED ORDER — PHENYLEPHRINE HCL 2.5 % OP SOLN
1.0000 [drp] | OPHTHALMIC | Status: AC
  Administered 2011-01-29 (×3): 1 [drp] via OPHTHALMIC

## 2011-01-29 MED ORDER — BSS IO SOLN
INTRAOCULAR | Status: DC | PRN
  Administered 2011-01-29: 15 mL via INTRAOCULAR

## 2011-01-29 MED ORDER — FENTANYL CITRATE 0.05 MG/ML IJ SOLN
INTRAMUSCULAR | Status: AC
  Filled 2011-01-29: qty 2

## 2011-01-29 MED ORDER — LIDOCAINE HCL (PF) 1 % IJ SOLN
INTRAOCULAR | Status: DC | PRN
  Administered 2011-01-29: 12:00:00 via OPHTHALMIC

## 2011-01-29 MED ORDER — TETRACAINE HCL 0.5 % OP SOLN
1.0000 [drp] | OPHTHALMIC | Status: AC
  Administered 2011-01-29 (×3): 1 [drp] via OPHTHALMIC

## 2011-01-29 MED ORDER — MIDAZOLAM HCL 2 MG/2ML IJ SOLN
1.0000 mg | INTRAMUSCULAR | Status: DC | PRN
  Administered 2011-01-29: 2 mg via INTRAVENOUS

## 2011-01-29 MED ORDER — CARBACHOL 0.01 % IO SOLN
INTRAOCULAR | Status: DC | PRN
  Administered 2011-01-29: 0.5 mL via INTRAOCULAR

## 2011-01-29 SURGICAL SUPPLY — 32 items

## 2011-01-29 NOTE — Transfer of Care (Signed)
Immediate Anesthesia Transfer of Care Note  Patient: [name]  Procedure(s) Performed:  CATARACT EXTRACTION PHACO AND INTRAOCULAR LENS PLACEMENT (IOC) - CDE: 1.81  Patient Location: Short Stay  Anesthesia Type: MAC  Level of Consciousness: awake, alert  and oriented  Airway & Oxygen Therapy: Patient Spontanous Breathing  Post-op Assessment: Report given to PACU RN and Post -op Vital signs reviewed and stable  Post vital signs: Reviewed and stable  Complications: No apparent anesthesia complications

## 2011-01-29 NOTE — Anesthesia Preprocedure Evaluation (Addendum)
Anesthesia Evaluation  Patient identified by MRN, date of birth, ID band Patient awake    Reviewed: Allergy & Precautions, H&P , NPO status , Patient's Chart, lab work & pertinent test results  Airway Mallampati: III      Dental  (+) Teeth Intact   Pulmonary COPD PE x 3   + decreased breath sounds      Cardiovascular hypertension, Pt. on medications + CAD Regular Normal    Neuro/Psych    GI/Hepatic hiatal hernia, GERD-  Medicated,  Endo/Other  Diabetes mellitus-, Type 2, Insulin DependentHypothyroidism   Renal/GU      Musculoskeletal   Abdominal   Peds  Hematology   Anesthesia Other Findings   Reproductive/Obstetrics                           Anesthesia Physical Anesthesia Plan  ASA: III  Anesthesia Plan: MAC   Post-op Pain Management:    Induction: Intravenous  Airway Management Planned: Nasal Cannula  Additional Equipment:   Intra-op Plan:   Post-operative Plan:   Informed Consent: I have reviewed the patients History and Physical, chart, labs and discussed the procedure including the risks, benefits and alternatives for the proposed anesthesia with the patient or authorized representative who has indicated his/her understanding and acceptance.     Plan Discussed with:   Anesthesia Plan Comments:         Anesthesia Quick Evaluation

## 2011-01-29 NOTE — Anesthesia Postprocedure Evaluation (Signed)
  Anesthesia Post-op Note  Patient: [name]  Procedure(s) Performed:  CATARACT EXTRACTION PHACO AND INTRAOCULAR LENS PLACEMENT (IOC) - CDE: 1.81  Patient Location: Short Stay  Anesthesia Type: MAC  Level of Consciousness: awake, alert  and oriented  Airway and Oxygen Therapy: Patient Spontanous Breathing  Post-op Pain: none  Post-op Assessment: Post-op Vital signs reviewed, Patient's Cardiovascular Status Stable, Respiratory Function Stable, Patent Airway and No signs of Nausea or vomiting  Post-op Vital Signs: Reviewed and stable  Complications: No apparent anesthesia complications

## 2011-01-29 NOTE — H&P (Signed)
I have reviewed the H&P, the patient was re-examined, and I have identified no interval changes in medical condition and plan of care since the history and physical of record  

## 2011-01-29 NOTE — Brief Op Note (Signed)
Pre-Op Dx: Cataract OS Post-Op Dx: Cataract OS Surgeon: Lyall Faciane Anesthesia: Topical with MAC Implant: Lenstec, Model Softec HD Specimen: None Complications: None 

## 2011-01-29 NOTE — Anesthesia Procedure Notes (Signed)
Procedure Name: MAC Date/Time: 01/29/2011 11:44 AM Performed by: Carolyne Littles, Lutie Pickler Pre-anesthesia Checklist: Patient identified, Patient being monitored, Emergency Drugs available, Timeout performed and Suction available Oxygen Delivery Method: Nasal Cannula

## 2011-01-30 NOTE — Op Note (Signed)
NAMEGALEN, [name]          ACCOUNT NO.:  [id number]  MEDICAL RECORD NO.:  [id number]  LOCATION:  [facility]                          FACILITY:  [facility]  PHYSICIAN:  Susanne Greenhouse, MD       DATE OF BIRTH:  March 31, 1950  DATE OF PROCEDURE:  01/29/2011 DATE OF DISCHARGE:  01/29/2011                              OPERATIVE REPORT   PREOPERATIVE DIAGNOSIS:  Combined cataract, left eye, diagnosis code 366.19.  POSTOPERATIVE DIAGNOSIS:  Combined cataract, left eye, diagnosis code 366.19.  OPERATION PERFORMED:  Phacoemulsification with posterior chamber intraocular lens implantation, left eye.  SURGEON:  Bonne Dolores. Hansika Leaming, MD  ANESTHESIA:  General endotracheal anesthesia.  OPERATIVE SUMMARY:  In the preoperative area, dilating drops were placed into the left eye.  The patient was then brought into the operating room where he was placed under general anesthesia.  The eye was then prepped and draped.  Beginning with a 75 blade, a paracentesis port was made at the surgeon's 2 o'clock position.  The anterior chamber was then filled with a 1% nonpreserved lidocaine solution with epinephrine.  This was followed by Viscoat to deepen the chamber.  A small fornix-based peritomy was performed superiorly.  Next, a single iris hook was placed through the limbus superiorly.  A 2.4-mm keratome blade was then used to make a clear corneal incision over the iris hook.  A bent cystotome needle and Utrata forceps were used to create a continuous tear capsulotomy.  Hydrodissection was performed using balanced salt solution on a fine cannula.  The lens nucleus was then removed using phacoemulsification in a quadrant cracking technique.  The cortical material was then removed with irrigation and aspiration.  The capsular bag and anterior chamber were refilled with Provisc.  The wound was widened to approximately 3 mm and a posterior chamber intraocular lens was placed into the capsular bag without difficulty using  an [employer] lens injecting system.  A single 10-0 nylon suture was then used to close the incision as well as stromal hydration.  The Provisc was removed from the anterior chamber and capsular bag with irrigation and aspiration.  At this point, the wounds were tested for leak, which were negative.  The anterior chamber remained deep and stable.  The patient tolerated the procedure well.  There were no operative complications, and he awoke from general anesthesia without problem.  No surgical specimens.  The patient's surgery was performed on a regular hospital stretcher due to the patient's weight being too heavy for a eye bed and this made positioning and comfort very difficult for the surgeon.  There were no complications and no surgical specimens. Prosthetic device used is a Lenstec posterior chamber lens, model Softec HD, power of 16.5, serial number is [id number].          ______________________________ Susanne Greenhouse, MD     KEH/MEDQ  D:  01/29/2011  T:  01/29/2011  Job:  [id number]

## 2011-02-02 ENCOUNTER — Encounter (HOSPITAL_COMMUNITY): Payer: Self-pay | Admitting: Ophthalmology

## 2011-03-12 ENCOUNTER — Emergency Department (HOSPITAL_COMMUNITY): Payer: 59

## 2011-03-12 ENCOUNTER — Inpatient Hospital Stay (HOSPITAL_COMMUNITY)
Admission: EM | Admit: 2011-03-12 | Discharge: 2011-03-20 | DRG: 247 | Disposition: A | Discharge status: Home Health | Payer: 59 | Source: Ambulatory Visit | Attending: Cardiology | Admitting: Cardiology

## 2011-03-12 ENCOUNTER — Other Ambulatory Visit: Payer: Self-pay

## 2011-03-12 ENCOUNTER — Encounter (HOSPITAL_COMMUNITY): Payer: Self-pay | Admitting: *Deleted

## 2011-03-12 DIAGNOSIS — K449 Diaphragmatic hernia without obstruction or gangrene: Secondary | ICD-10-CM | POA: Diagnosis present

## 2011-03-12 DIAGNOSIS — J4489 Other specified chronic obstructive pulmonary disease: Secondary | ICD-10-CM | POA: Diagnosis present

## 2011-03-12 DIAGNOSIS — I10 Essential (primary) hypertension: Secondary | ICD-10-CM

## 2011-03-12 DIAGNOSIS — I214 Non-ST elevation (NSTEMI) myocardial infarction: Secondary | ICD-10-CM

## 2011-03-12 DIAGNOSIS — Z6841 Body Mass Index (BMI) 40.0 and over, adult: Secondary | ICD-10-CM

## 2011-03-12 DIAGNOSIS — Z961 Presence of intraocular lens: Secondary | ICD-10-CM

## 2011-03-12 DIAGNOSIS — E039 Hypothyroidism, unspecified: Secondary | ICD-10-CM

## 2011-03-12 DIAGNOSIS — R791 Abnormal coagulation profile: Secondary | ICD-10-CM

## 2011-03-12 DIAGNOSIS — Z86711 Personal history of pulmonary embolism: Secondary | ICD-10-CM

## 2011-03-12 DIAGNOSIS — E782 Mixed hyperlipidemia: Secondary | ICD-10-CM | POA: Diagnosis present

## 2011-03-12 DIAGNOSIS — Z79899 Other long term (current) drug therapy: Secondary | ICD-10-CM

## 2011-03-12 DIAGNOSIS — I2510 Atherosclerotic heart disease of native coronary artery without angina pectoris: Secondary | ICD-10-CM | POA: Diagnosis present

## 2011-03-12 DIAGNOSIS — E119 Type 2 diabetes mellitus without complications: Secondary | ICD-10-CM | POA: Diagnosis present

## 2011-03-12 DIAGNOSIS — Z8543 Personal history of malignant neoplasm of ovary: Secondary | ICD-10-CM

## 2011-03-12 DIAGNOSIS — R739 Hyperglycemia, unspecified: Secondary | ICD-10-CM

## 2011-03-12 DIAGNOSIS — E785 Hyperlipidemia, unspecified: Secondary | ICD-10-CM

## 2011-03-12 DIAGNOSIS — Z87891 Personal history of nicotine dependence: Secondary | ICD-10-CM

## 2011-03-12 DIAGNOSIS — Z4901 Encounter for fitting and adjustment of extracorporeal dialysis catheter: Secondary | ICD-10-CM

## 2011-03-12 DIAGNOSIS — J449 Chronic obstructive pulmonary disease, unspecified: Secondary | ICD-10-CM | POA: Diagnosis present

## 2011-03-12 DIAGNOSIS — I2699 Other pulmonary embolism without acute cor pulmonale: Secondary | ICD-10-CM | POA: Diagnosis present

## 2011-03-12 DIAGNOSIS — R079 Chest pain, unspecified: Secondary | ICD-10-CM | POA: Diagnosis present

## 2011-03-12 HISTORY — DX: Chronic obstructive pulmonary disease, unspecified: J44.9

## 2011-03-12 LAB — COMPREHENSIVE METABOLIC PANEL
ALT: 32 U/L (ref 0–35)
AST: 20 U/L (ref 0–37)
Alkaline Phosphatase: 78 U/L (ref 39–117)
CO2: 25 mEq/L (ref 19–32)
Calcium: 9.8 mg/dL (ref 8.4–10.5)
GFR calc non Af Amer: 69 mL/min — ABNORMAL LOW (ref >90)
Potassium: 4.1 mEq/L (ref 3.5–5.1)
Sodium: 136 mEq/L (ref 135–145)

## 2011-03-12 LAB — CARDIAC PANEL(CRET KIN+CKTOT+MB+TROPI)
CK, MB: 21.4 ng/mL (ref 0.3–4.0)
Relative Index: 3.1 — ABNORMAL HIGH (ref 0.0–2.5)
Total CK: 290 U/L — ABNORMAL HIGH (ref 7–177)
Troponin I: <0.3 ng/mL (ref <0.30)
Troponin I: 4.6 ng/mL (ref <0.30)

## 2011-03-12 LAB — LIPID PANEL
Cholesterol: 172 mg/dL (ref 0–200)
LDL Cholesterol: 88 mg/dL (ref 0–99)
Total CHOL/HDL Ratio: 2.9 RATIO
VLDL: 25 mg/dL (ref 0–40)

## 2011-03-12 LAB — CBC
MCH: 25.5 pg — ABNORMAL LOW (ref 26.0–34.0)
Platelets: 241 10*3/uL (ref 150–400)
RBC: 4.99 MIL/uL (ref 3.87–5.11)
WBC: 11 10*3/uL — ABNORMAL HIGH (ref 4.0–10.5)

## 2011-03-12 LAB — PROTIME-INR: INR: 1.84 — ABNORMAL HIGH (ref 0.00–1.49)

## 2011-03-12 LAB — HEPATIC FUNCTION PANEL
AST: 32 U/L (ref 0–37)
Albumin: 3.6 g/dL (ref 3.5–5.2)
Alkaline Phosphatase: 64 U/L (ref 39–117)
Bilirubin, Direct: <0.1 mg/dL (ref 0.0–0.3)
Total Bilirubin: 0.3 mg/dL (ref 0.3–1.2)

## 2011-03-12 LAB — GLUCOSE, CAPILLARY: Glucose-Capillary: 156 mg/dL — ABNORMAL HIGH (ref 70–99)

## 2011-03-12 LAB — MRSA PCR SCREENING: MRSA by PCR: NEGATIVE

## 2011-03-12 LAB — D-DIMER, QUANTITATIVE: D-Dimer, Quant: 0.57 ug/mL-FEU — ABNORMAL HIGH (ref 0.00–0.48)

## 2011-03-12 MED ORDER — ACETAMINOPHEN 650 MG RE SUPP: 650.0000 mg | Freq: Four times a day (QID) | RECTAL | Status: DC | PRN

## 2011-03-12 MED ORDER — SODIUM CHLORIDE 0.9 % IV SOLN
INTRAVENOUS | Status: AC
  Administered 2011-03-12: 14:00:00 via INTRAVENOUS

## 2011-03-12 MED ORDER — AMLODIPINE BESYLATE 10 MG PO TABS
10.0000 mg | ORAL_TABLET | Freq: Every day | ORAL | Status: DC
  Administered 2011-03-12 – 2011-03-20 (×9): 10 mg via ORAL
  Filled 2011-03-12 (×8): qty 1
  Filled 2011-03-12: qty 2

## 2011-03-12 MED ORDER — MORPHINE SULFATE 4 MG/ML IJ SOLN
INTRAMUSCULAR | Status: AC
  Administered 2011-03-12: 2 mg
  Filled 2011-03-12: qty 1

## 2011-03-12 MED ORDER — HYDROMORPHONE HCL PF 1 MG/ML IJ SOLN
1.0000 mg | INTRAMUSCULAR | Status: DC | PRN
  Administered 2011-03-12: 0.5 mg via INTRAVENOUS

## 2011-03-12 MED ORDER — IPRATROPIUM BROMIDE 0.02 % IN SOLN: 500.0000 ug | Freq: Four times a day (QID) | RESPIRATORY_TRACT | Status: DC | PRN

## 2011-03-12 MED ORDER — ALBUTEROL SULFATE (5 MG/ML) 0.5% IN NEBU: 2.5000 mg | INHALATION_SOLUTION | Freq: Four times a day (QID) | RESPIRATORY_TRACT | Status: DC | PRN

## 2011-03-12 MED ORDER — FLUTICASONE-SALMETEROL 250-50 MCG/DOSE IN AEPB
1.0000 | INHALATION_SPRAY | Freq: Two times a day (BID) | RESPIRATORY_TRACT | Status: DC
  Administered 2011-03-12 – 2011-03-20 (×14): 1 via RESPIRATORY_TRACT
  Filled 2011-03-12 (×3): qty 14

## 2011-03-12 MED ORDER — NITROGLYCERIN 2 % TD OINT
1.0000 [in_us] | TOPICAL_OINTMENT | Freq: Once | TRANSDERMAL | Status: AC
  Administered 2011-03-12: 1 [in_us] via TOPICAL
  Filled 2011-03-12: qty 1

## 2011-03-12 MED ORDER — SIMVASTATIN 40 MG PO TABS
40.0000 mg | ORAL_TABLET | Freq: Every day | ORAL | Status: DC
  Administered 2011-03-12: 40 mg via ORAL
  Filled 2011-03-12: qty 1
  Filled 2011-03-12: qty 2

## 2011-03-12 MED ORDER — NITROGLYCERIN IN D5W 200-5 MCG/ML-% IV SOLN
2.0000 ug/min | INTRAVENOUS | Status: DC
  Administered 2011-03-12: 5 ug/min via INTRAVENOUS
  Administered 2011-03-13: 90 ug/min via INTRAVENOUS
  Administered 2011-03-14: 70 ug/min via INTRAVENOUS
  Administered 2011-03-14 (×2): 90 ug/min via INTRAVENOUS
  Administered 2011-03-14: 80 ug/min via INTRAVENOUS
  Administered 2011-03-15: 35 ug/min via INTRAVENOUS
  Administered 2011-03-15: 75 ug/min via INTRAVENOUS
  Administered 2011-03-16: 20 ug/min via INTRAVENOUS
  Filled 2011-03-12 (×8): qty 250

## 2011-03-12 MED ORDER — MECLIZINE HCL 25 MG PO TABS
25.0000 mg | ORAL_TABLET | ORAL | Status: DC
  Administered 2011-03-13 – 2011-03-20 (×8): 25 mg via ORAL
  Filled 2011-03-12 (×11): qty 1

## 2011-03-12 MED ORDER — PANTOPRAZOLE SODIUM 40 MG PO TBEC
40.0000 mg | DELAYED_RELEASE_TABLET | Freq: Every day | ORAL | Status: DC
  Administered 2011-03-13 – 2011-03-20 (×8): 40 mg via ORAL
  Filled 2011-03-12 (×9): qty 1

## 2011-03-12 MED ORDER — ACETAMINOPHEN 325 MG PO TABS: 650.0000 mg | ORAL_TABLET | Freq: Four times a day (QID) | ORAL | Status: DC | PRN

## 2011-03-12 MED ORDER — HEPARIN BOLUS VIA INFUSION
4000.0000 [IU] | Freq: Once | INTRAVENOUS | Status: AC
  Administered 2011-03-12: 4000 [IU] via INTRAVENOUS
  Filled 2011-03-12: qty 4000

## 2011-03-12 MED ORDER — WARFARIN SODIUM 5 MG PO TABS: 5.0000 mg | ORAL_TABLET | Freq: Every day | ORAL | Status: DC

## 2011-03-12 MED ORDER — LORATADINE 10 MG PO TABS
10.0000 mg | ORAL_TABLET | Freq: Every evening | ORAL | Status: DC
  Administered 2011-03-14 – 2011-03-19 (×6): 10 mg via ORAL
  Filled 2011-03-12 (×8): qty 1

## 2011-03-12 MED ORDER — OLMESARTAN MEDOXOMIL 40 MG PO TABS
40.0000 mg | ORAL_TABLET | ORAL | Status: DC
  Administered 2011-03-13 – 2011-03-20 (×8): 40 mg via ORAL
  Filled 2011-03-12 (×12): qty 1

## 2011-03-12 MED ORDER — WHITE PETROLATUM GEL
Status: AC
  Administered 2011-03-12: 23:00:00
  Filled 2011-03-12: qty 5

## 2011-03-12 MED ORDER — GLIPIZIDE 5 MG PO TABS
5.0000 mg | ORAL_TABLET | Freq: Two times a day (BID) | ORAL | Status: DC
  Filled 2011-03-12 (×5): qty 1

## 2011-03-12 MED ORDER — MORPHINE SULFATE 2 MG/ML IJ SOLN
2.0000 mg | INTRAMUSCULAR | Status: DC | PRN
  Administered 2011-03-13 – 2011-03-14 (×4): 2 mg via INTRAVENOUS
  Filled 2011-03-12 (×4): qty 1

## 2011-03-12 MED ORDER — INSULIN ASPART 100 UNIT/ML ~~LOC~~ SOLN
0.0000 [IU] | Freq: Three times a day (TID) | SUBCUTANEOUS | Status: DC
  Administered 2011-03-12 – 2011-03-13 (×3): 3 [IU] via SUBCUTANEOUS
  Filled 2011-03-12 (×2): qty 3

## 2011-03-12 MED ORDER — HYDROMORPHONE HCL PF 1 MG/ML IJ SOLN
INTRAMUSCULAR | Status: AC
  Filled 2011-03-12: qty 1

## 2011-03-12 MED ORDER — SODIUM CHLORIDE 0.9 % IJ SOLN
INTRAMUSCULAR | Status: AC
  Administered 2011-03-12: 10 mL via INTRAVENOUS
  Filled 2011-03-12: qty 3

## 2011-03-12 MED ORDER — NITROGLYCERIN IN D5W 200-5 MCG/ML-% IV SOLN
INTRAVENOUS | Status: AC
  Administered 2011-03-12: 5 ug/min via INTRAVENOUS
  Filled 2011-03-12: qty 250

## 2011-03-12 MED ORDER — ONDANSETRON HCL 4 MG PO TABS: 4.0000 mg | ORAL_TABLET | Freq: Four times a day (QID) | ORAL | Status: DC | PRN

## 2011-03-12 MED ORDER — HYDRALAZINE HCL 25 MG PO TABS
25.0000 mg | ORAL_TABLET | Freq: Three times a day (TID) | ORAL | Status: DC
  Administered 2011-03-12 – 2011-03-13 (×3): 25 mg via ORAL
  Filled 2011-03-12 (×8): qty 1

## 2011-03-12 MED ORDER — NITROGLYCERIN 0.4 MG SL SUBL
0.4000 mg | SUBLINGUAL_TABLET | SUBLINGUAL | Status: AC | PRN
  Administered 2011-03-12 (×3): 0.4 mg via SUBLINGUAL
  Filled 2011-03-12: qty 25

## 2011-03-12 MED ORDER — IOHEXOL 300 MG/ML  SOLN
100.0000 mL | Freq: Once | INTRAMUSCULAR | Status: AC | PRN
  Administered 2011-03-12: 100 mL via INTRAVENOUS

## 2011-03-12 MED ORDER — INSULIN ASPART 100 UNIT/ML ~~LOC~~ SOLN: 0.0000 [IU] | Freq: Every day | SUBCUTANEOUS | Status: DC

## 2011-03-12 MED ORDER — ONDANSETRON HCL 4 MG/2ML IJ SOLN: 4.0000 mg | Freq: Four times a day (QID) | INTRAMUSCULAR | Status: DC | PRN

## 2011-03-12 MED ORDER — SODIUM CHLORIDE 0.9 % IJ SOLN
3.0000 mL | Freq: Two times a day (BID) | INTRAMUSCULAR | Status: DC
  Administered 2011-03-12: 10 mL via INTRAVENOUS

## 2011-03-12 MED ORDER — WARFARIN SODIUM 10 MG PO TABS: 10.0000 mg | ORAL_TABLET | Freq: Once | ORAL | Status: DC

## 2011-03-12 MED ORDER — SODIUM CHLORIDE 0.9 % IJ SOLN: 3.0000 mL | Freq: Two times a day (BID) | INTRAMUSCULAR | Status: DC

## 2011-03-12 MED ORDER — HEPARIN SOD (PORCINE) IN D5W 100 UNIT/ML IV SOLN
12.0000 [IU]/kg/h | INTRAVENOUS | Status: DC
  Administered 2011-03-12 – 2011-03-13 (×2): 12 [IU]/kg/h via INTRAVENOUS
  Filled 2011-03-12 (×3): qty 250

## 2011-03-12 MED ORDER — LEVOCETIRIZINE DIHYDROCHLORIDE 5 MG PO TABS
5.0000 mg | ORAL_TABLET | Freq: Every evening | ORAL | Status: DC
Start: 2011-03-12 — End: 2011-03-12

## 2011-03-12 MED ORDER — LEVOTHYROXINE SODIUM 112 MCG PO TABS
112.0000 ug | ORAL_TABLET | ORAL | Status: DC
  Administered 2011-03-13: 112 ug via ORAL
  Filled 2011-03-12 (×3): qty 1

## 2011-03-12 MED ORDER — LINAGLIPTIN 5 MG PO TABS
5.0000 mg | ORAL_TABLET | Freq: Every day | ORAL | Status: DC
  Filled 2011-03-12 (×4): qty 1

## 2011-03-12 MED ORDER — MORPHINE SULFATE 2 MG/ML IJ SOLN
2.0000 mg | Freq: Once | INTRAMUSCULAR | Status: AC
  Administered 2011-03-12: 2 mg via INTRAVENOUS

## 2011-03-12 MED ORDER — SODIUM CHLORIDE 0.9 % IV SOLN
INTRAVENOUS | Status: DC
  Administered 2011-03-13: 15:00:00 via INTRAVENOUS

## 2011-03-12 MED ORDER — TRIAMTERENE-HCTZ 37.5-25 MG PO CAPS
1.0000 | ORAL_CAPSULE | ORAL | Status: DC
  Administered 2011-03-13 – 2011-03-20 (×8): 1 via ORAL
  Filled 2011-03-12 (×11): qty 1

## 2011-03-12 MED ORDER — ALBUTEROL SULFATE (5 MG/ML) 0.5% IN NEBU: 2.5000 mg | INHALATION_SOLUTION | RESPIRATORY_TRACT | Status: DC | PRN

## 2011-03-12 MED ORDER — ASPIRIN EC 325 MG PO TBEC
325.0000 mg | DELAYED_RELEASE_TABLET | Freq: Every day | ORAL | Status: AC
  Administered 2011-03-12 – 2011-03-13 (×2): 325 mg via ORAL
  Filled 2011-03-12 (×2): qty 1

## 2011-03-12 MED ORDER — HYDROCODONE-ACETAMINOPHEN 5-325 MG PO TABS
1.0000 | ORAL_TABLET | Freq: Four times a day (QID) | ORAL | Status: DC | PRN
  Administered 2011-03-12 – 2011-03-19 (×6): 1 via ORAL
  Filled 2011-03-12 (×6): qty 1

## 2011-03-12 MED ORDER — HYDROMORPHONE HCL PF 1 MG/ML IJ SOLN
1.0000 mg | Freq: Once | INTRAMUSCULAR | Status: AC
  Administered 2011-03-12: 1 mg via INTRAVENOUS
  Filled 2011-03-12: qty 1

## 2011-03-12 NOTE — ED Notes (Signed)
Report given to Nadine, RN.

## 2011-03-12 NOTE — Progress Notes (Signed)
Patient admitted earlier today for chest pain  She reports she has been chest pain free since admission, since receiving IV pain medications  Cardiac enzymes are elevated  Patient started on heparin infusion, coumadin held  Case discussed with Dr. Shirlee Latch from [facility] and recommendations were to transfer patient to [facility] CCU for further cardiac intervention.  [facility] called and informed of bed request.

## 2011-03-12 NOTE — ED Notes (Signed)
Began having a headache at 0200. States, "My pulse got up to 109 and it was pounding in my chest". Pain is currently to chest, back, left arm and "jaws". NAD. Pt received 324 mg asa enroute and one nitro spray. Pain currently rated at 8.

## 2011-03-12 NOTE — Progress Notes (Signed)
ANTICOAGULATION CONSULT NOTE - Initial Consult  Pharmacy Consult for Warfarin Indication: pulmonary embolus (continuation of home dose)  Allergies  Allergen Reactions  . Codeine Shortness Of Breath and Itching  . Penicillins Rash  . Sulfa Antibiotics Rash    Patient Measurements: Height: 5\' 1"  (154.9 cm) Weight: 325 lb (147.419 kg) IBW/kg (Calculated) : 47.8  Dosing Weight: not applicable  Vital Signs: Temp: 97.5 F (36.4 C) (02/14 1305) Temp src: Oral (02/14 1305) BP: 113/88 mmHg (02/14 1305) Pulse Rate: 90  (02/14 1305)  Labs:  [facility] 03/12/11 0722  HGB 12.7  HCT 40.2  PLT 241  APTT --  LABPROT 21.6*  INR 1.84*  HEPARINUNFRC --  CREATININE 0.88  CKTOTAL 112  CKMB 3.5  TROPONINI <0.30   Estimated Creatinine Clearance: 91.7 ml/min (by C-G formula based on Cr of 0.88).  Medical History: Past Medical History  Diagnosis Date  . Diabetes mellitus   . Hypertension   . Pulmonary embolism   . Hiatal hernia   . Ovarian cancer   . COPD (chronic obstructive pulmonary disease)     Medications:  Scheduled:    . sodium chloride   Intravenous STAT  . amLODipine  10 mg Oral Daily  . aspirin EC  325 mg Oral Daily  . Fluticasone-Salmeterol  1 puff Inhalation BID  . glipiZIDE  5 mg Oral BID AC  . hydrALAZINE  25 mg Oral TID  .  HYDROmorphone (DILAUDID) injection  1 mg Intravenous Once  . insulin aspart  0-15 Units Subcutaneous TID WC  . insulin aspart  0-5 Units Subcutaneous QHS  . levothyroxine  112 mcg Oral BH-q7a  . linagliptin  5 mg Oral Daily  . loratadine  10 mg Oral QPM  . meclizine  25 mg Oral BH-q7a  . nitroGLYCERIN  1 inch Topical Once  . olmesartan  40 mg Oral BH-q7a  . pantoprazole  40 mg Oral Daily  . simvastatin  40 mg Oral Daily  . sodium chloride  3 mL Intravenous Q12H  . triamterene-hydrochlorothiazide  1 each Oral BH-q7a  . warfarin  10 mg Oral ONCE-1800  . DISCONTD: levocetirizine  5 mg Oral QPM  . DISCONTD: sodium chloride  3 mL  Intravenous Q12H  . DISCONTD: warfarin  5-7.5 mg Oral Daily  . DISCONTD: warfarin  5-7.5 mg Oral Daily    Assessment: Continuation of home therapy for hx of PE. Sub-therapeutic INR.  Goal of Therapy:  INR 2-3   Plan:  Warfarin 10 mg today. Daily INR  Tomi Bamberger J 03/12/2011,2:37 PM

## 2011-03-12 NOTE — H&P (Signed)
PCP:   Dwana Melena, MD, MD   Chief Complaint:  Chest pain  HPI: This is a 61 y/o morbidly obese female, with hypertension, diabetes, hyperlipidemia and COPD. Patient presents to the emergency room today with complaints of epigastric/substernal chest pain. She reports onset of these symptoms occurring earlier today. She has had this before but reports that it has not been this bad. She reports a onset of symptoms while she was lying down getting ready to sleep. She felt a little short of breath while having the pain. Her pulse was checked and was noted to be elevated at 109. She denies any nausea, diaphoresis. She did feel some palpitations. She was evaluated in the emergency room and did not have an acute EKG and had negative troponins, but due to her multiple risk factors she has been referred for admission. The patient does not report any prior cardiac history and has not had any formal cardiac testing in the past.  Allergies:   Allergies  Allergen Reactions  . Codeine Shortness Of Breath and Itching  . Penicillins Rash  . Sulfa Antibiotics Rash      Past Medical History  Diagnosis Date  . Diabetes mellitus   . Hypertension   . Pulmonary embolism   . Hiatal hernia   . Ovarian cancer   . COPD (chronic obstructive pulmonary disease)     Past Surgical History  Procedure Date  . Abdominal surgery   . Cholecystectomy   . Appendectomy   . Cesarean section   . Tonsillectomy   . Breast lumpectomy   . Small intestine surgery 12/2007    Dr Lovell Sheehan  . Closure of abdominal wound/wound vac    . Cataract extraction w/phaco 01/29/2011    Procedure: CATARACT EXTRACTION PHACO AND INTRAOCULAR LENS PLACEMENT (IOC);  Surgeon: Gemma Payor;  Location: [facility];  Service: Ophthalmology;  Laterality: Left;  CDE: 1.81    Prior to Admission medications   Medication Sig Start Date End Date Taking? Authorizing Provider  albuterol (PROVENTIL) (2.5 MG/3ML) 0.083% nebulizer solution Take 2.5 mg by  nebulization every 6 (six) hours as needed. For wheezing. Combines with atrovent   Yes Historical Provider, MD  amLODipine (NORVASC) 10 MG tablet Take 1 tablet (10 mg total) by mouth daily. 08/11/10 08/11/11 Yes Nimish Normajean Glasgow, MD  atorvastatin (LIPITOR) 20 MG tablet Take 20 mg by mouth every morning.    Yes Historical Provider, MD  cetirizine (ZYRTEC) 10 MG tablet Take 10 mg by mouth every morning.    Yes Historical Provider, MD  esomeprazole (NEXIUM) 40 MG capsule Take 40 mg by mouth daily before breakfast.    Yes Historical Provider, MD  Ferrous Sulfate (IRON) 325 (65 FE) MG TABS Take 1 tablet by mouth daily after breakfast.    Yes Historical Provider, MD  Fluticasone-Salmeterol (ADVAIR) 250-50 MCG/DOSE AEPB Inhale 1 puff into the lungs 2 (two) times daily. 08/11/10  Yes Nimish Normajean Glasgow, MD  glipiZIDE-metformin (METAGLIP) 2.5-500 MG per tablet Take 1 tablet by mouth 2 (two) times daily before a meal.     Yes Historical Provider, MD  hydrALAZINE (APRESOLINE) 25 MG tablet Take 25 mg by mouth 3 (three) times daily.   Yes Historical Provider, MD  HYDROcodone-acetaminophen (NORCO) 5-325 MG per tablet Take 1 tablet by mouth every 6 (six) hours as needed. For pain   Yes Historical Provider, MD  ipratropium (ATROVENT) 0.02 % nebulizer solution Take 500 mcg by nebulization every 6 (six) hours as needed. For shortness of breath. Combines it  with Albuterol 08/11/10 08/11/11 Yes Nimish Normajean Glasgow, MD  levocetirizine (XYZAL) 5 MG tablet Take 5 mg by mouth every evening.     Yes Historical Provider, MD  levothyroxine (SYNTHROID, LEVOTHROID) 112 MCG tablet Take 112 mcg by mouth every morning.    Yes Historical Provider, MD  meclizine (ANTIVERT) 25 MG tablet Take 25 mg by mouth every morning.    Yes Historical Provider, MD  olmesartan (BENICAR) 40 MG tablet Take 40 mg by mouth every morning.    Yes Historical Provider, MD  Prenatal Vit-Fe Fumarate-FA (PRENATAL MULTIVITAMIN) TABS Take 1 tablet by mouth daily.     Yes  Historical Provider, MD  saxagliptin HCl (ONGLYZA) 5 MG TABS tablet Take 5 mg by mouth every morning.    Yes Historical Provider, MD  triamterene-hydrochlorothiazide (DYAZIDE) 37.5-25 MG per capsule Take 1 capsule by mouth every morning.     Yes Historical Provider, MD  warfarin (COUMADIN) 2 MG tablet Take 5-7 mg by mouth daily. Takes 7 mg on Monday, Wednesday, Friday & Sunday and takes 5 mg all other days Takes 1 tablet with the 5mg  tablet to make dose of 7mg .   Yes Historical Provider, MD  warfarin (COUMADIN) 5 MG tablet Take 5-7 mg by mouth daily. Takes 7 mg on Monday, Wednesday, Friday & Sunday & takes 5 mg all other days Takes 1 tablet with the 2mg  tablet to make dose of 7mg .   Yes Historical Provider, MD    Social History:  reports that she has quit smoking. She does not have any smokeless tobacco history on file. She reports that she does not drink alcohol or use illicit drugs.  History reviewed. No pertinent family history.  Review of Systems: Positives in bold Constitutional: Denies fever, chills, diaphoresis, appetite change and fatigue.  HEENT: Denies photophobia, eye pain, redness, hearing loss, ear pain, congestion, sore throat, rhinorrhea, sneezing, mouth sores, trouble swallowing, neck pain, neck stiffness and tinnitus.   Respiratory: Denies SOB, DOE, cough, chest tightness,  and wheezing.   Cardiovascular: Denies chest pain, palpitations and leg swelling.  Gastrointestinal: Denies nausea, vomiting, abdominal pain, diarrhea, constipation, blood in stool and abdominal distention.  Genitourinary: Denies dysuria, urgency, frequency, hematuria, flank pain and difficulty urinating.  Musculoskeletal: Denies myalgias, back pain, joint swelling, arthralgias and gait problem.  Skin: Denies pallor, rash and wound.  Neurological: Denies dizziness, seizures, syncope, weakness, light-headedness, numbness and headaches.  Hematological: Denies adenopathy. Easy bruising, personal or family  bleeding history  Psychiatric/Behavioral: Denies suicidal ideation, mood changes, confusion, nervousness, sleep disturbance and agitation   Physical Exam: Blood pressure 113/88, pulse 90, temperature 97.5 F (36.4 C), temperature source Oral, resp. rate 20, height 5\' 1"  (1.549 m), weight 147.419 kg (325 lb), SpO2 97.00%. General: This is a morbidly obese female, lying in bed, does not appear in acute distress, alert and oriented x3 HEENT: Normocephalic, atraumatic, pupils are equal and react to light Neck: Supple Chest: Clear to auscultation bilaterally Cardiac: S1, S2, regular rate and rhythm Abdomen: Soft, nontender, mild tenderness to palpation in the epigastrium, bowel sounds are active Extremities no intact, no cyanosis, clubbing, or edema Neurologic: Grossly intact, nonfocal Skin: intact, no visible rashes   Labs on Admission:  Results for orders placed during the hospital encounter of 03/12/11 (from the past 48 hour(s))  CBC     Status: Abnormal   Collection Time   03/12/11  7:22 AM      Component Value Range Comment   WBC 11.0 (*) 4.0 - 10.5 (K/uL)  RBC 4.99  3.87 - 5.11 (MIL/uL)    Hemoglobin 12.7  12.0 - 15.0 (g/dL)    HCT 16.1  09.6 - 04.5 (%)    MCV 80.6  78.0 - 100.0 (fL)    MCH 25.5 (*) 26.0 - 34.0 (pg)    MCHC 31.6  30.0 - 36.0 (g/dL)    RDW 40.9  81.1 - 91.4 (%)    Platelets 241  150 - 400 (K/uL)   COMPREHENSIVE METABOLIC PANEL     Status: Abnormal   Collection Time   03/12/11  7:22 AM      Component Value Range Comment   Sodium 136  135 - 145 (mEq/L)    Potassium 4.1  3.5 - 5.1 (mEq/L)    Chloride 101  96 - 112 (mEq/L)    CO2 25  19 - 32 (mEq/L)    Glucose, Bld 260 (*) 70 - 99 (mg/dL)    BUN 31 (*) 6 - 23 (mg/dL)    Creatinine, Ser 7.82  0.50 - 1.10 (mg/dL)    Calcium 9.8  8.4 - 10.5 (mg/dL)    Total Protein 7.7  6.0 - 8.3 (g/dL)    Albumin 4.0  3.5 - 5.2 (g/dL)    AST 20  0 - 37 (U/L)    ALT 32  0 - 35 (U/L)    Alkaline Phosphatase 78  39 - 117 (U/L)     Total Bilirubin 0.2 (*) 0.3 - 1.2 (mg/dL)    GFR calc non Af Amer 69 (*) >90 (mL/min)    GFR calc Af Amer 80 (*) >90 (mL/min)   CARDIAC PANEL(CRET KIN+CKTOT+MB+TROPI)     Status: Abnormal   Collection Time   03/12/11  7:22 AM      Component Value Range Comment   Total CK 112  7 - 177 (U/L)    CK, MB 3.5  0.3 - 4.0 (ng/mL)    Troponin I <0.30  <0.30 (ng/mL)    Relative Index 3.1 (*) 0.0 - 2.5    PROTIME-INR     Status: Abnormal   Collection Time   03/12/11  7:22 AM      Component Value Range Comment   Prothrombin Time 21.6 (*) 11.6 - 15.2 (seconds)    INR 1.84 (*) 0.00 - 1.49    D-DIMER, QUANTITATIVE     Status: Abnormal   Collection Time   03/12/11  7:22 AM      Component Value Range Comment   D-Dimer, Quant 0.57 (*) 0.00 - 0.48 (ug/mL-FEU)     Radiological Exams on Admission: Ct Angio Chest W/cm &/or Wo Cm  03/12/2011  *RADIOLOGY REPORT*  Clinical Data: Chest and arm pain.  History pulmonary embolus. Blood thinner.  Diabetes.  CT ANGIOGRAPHY CHEST  Technique:  Multidetector CT imaging of the chest using the standard protocol during bolus administration of intravenous contrast. Multiplanar reconstructed images including MIPs were obtained and reviewed to evaluate the vascular anatomy.  Contrast: OMNIPAQUE IOHEXOL 300 MG/ML IV SOLN  Comparison: 08/09/2010.  Findings: No acute pulmonary embolus noted.  Prominent three-vessel coronary artery calcifications. Cardiomegaly.  Prominent epicardial fat.  Aortic valve calcifications.  Calcification of the thoracic aorta. No obvious dissection.  Evaluation limited by cardiac motion.  No aneurysmal dilation of the thoracic aorta.  Calcification origin of the great vessels.  Shifting atelectatic changes lung bases and lingula.  Tiny right apical nodule unchanged.  Mild substernal extension of the left lobe of thyroid gland. Scattered small mediastinal lymph nodes.  Mild fatty infiltration of the liver.  Visualized upper abdominal structures  otherwise unremarkable.  Mild degenerative changes thoracic spine without bony destructive lesion.  IMPRESSION: No acute pulmonary embolus.  Prominent three-vessel coronary artery calcifications.  Atherosclerotic type changes thoracic aorta and great vessels.  Cardiomegaly.  Shifting atelectatic changes lung bases and lingula.  Stable tiny right upper lobe nodule.  Original Report Authenticated By: Fuller Canada, M.D.   Dg Chest Portable 1 View  03/12/2011  *RADIOLOGY REPORT*  Clinical Data: Mid chest pain. Shortness of breath.  High blood pressure.  Prior smoker.  History of blood clots.  PORTABLE CHEST - 1 VIEW  Comparison: 08/09/2010 chest CT.  08/08/2010 chest x-ray.  Findings: Mild cardiomegaly.  Central pulmonary vascular prominence.  No segmental infiltrate or gross pneumothorax. Calcified mildly tortuous aorta.  IMPRESSION: Mild cardiomegaly.  Central pulmonary vascular prominence.  No segmental infiltrate or gross pneumothorax.  Calcified mildly tortuous aorta.  Original Report Authenticated By: Fuller Canada, M.D.    Assessment/Plan Principal Problem:  *Chest pain Active Problems:  Obesity, Class III, BMI 40-49.9 (morbid obesity)  HTN (hypertension), benign  Diabetes mellitus  Hypothyroidism (acquired)  Hyperlipidemia  Recurrent pulmonary emboli  Hiatal hernia   plan:  Due to the patient's multiple risk factors, she will be admitted to a telemetry bed for observation. We will cycle her cardiac markers to rule out acute coronary syndrome. My suspicion is low and this is likely secondary to her hiatal hernia/GERD. We will continue her on aspirin as well as the remainder of her outpatient medications. We'll hold metformin since she recently received dye for CT angio. We'll continue the rest of her medications. We will continue her on proton inhibitors. We will also check lipase as well as liver function tests. Will also check hemoglobin A1c and fasting lipid panel. If patient's cardiac  enzymes are negative then she can likely be discharged tomorrow and have an outpatient stress test.   Time Spent on Admission:  Beadie Matsunaga [facility] Pager: [phone] 03/12/2011, 2:10 PM

## 2011-03-12 NOTE — Progress Notes (Signed)
CRITICAL VALUE ALERT  Critical value received: CKMB 21.4; Troponin 4.6  Date of notification:  03/12/11  Time of notification:  1605  Critical value read back:yes  Nurse who received alert:  N. Phylliss Bob, RN  MD notified (1st page):  Memon  Time of first page:  1605  MD notified (2nd page):  Time of second page:  Responding MD:  Kerry Hough  Time MD responded:  [phone]

## 2011-03-12 NOTE — Progress Notes (Signed)
ANTICOAGULATION CONSULT NOTE - Initial Consult  Pharmacy Consult for Heparin Indication: chest pain/ACS  Allergies  Allergen Reactions  . Codeine Shortness Of Breath and Itching  . Penicillins Rash  . Sulfa Antibiotics Rash    Patient Measurements: Height: 5\' 1"  (154.9 cm) Weight: 325 lb (147.419 kg) IBW/kg (Calculated) : 47.8    Vital Signs: Temp: 98 F (36.7 C) (02/14 1639) Temp src: Oral (02/14 1305) BP: 168/83 mmHg (02/14 1639) Pulse Rate: 101  (02/14 1639)  Labs:  Basename 03/12/11 1427 03/12/11 0722  HGB -- 12.7  HCT -- 40.2  PLT -- 241  APTT -- --  LABPROT -- 21.6*  INR -- 1.84*  HEPARINUNFRC -- --  CREATININE -- 0.88  CKTOTAL 290* 112  CKMB 21.4* 3.5  TROPONINI 4.60* <0.30   Estimated Creatinine Clearance: 91.7 ml/min (by C-G formula based on Cr of 0.88).  Medical History: Past Medical History  Diagnosis Date  . Diabetes mellitus   . Hypertension   . Pulmonary embolism   . Hiatal hernia   . Ovarian cancer   . COPD (chronic obstructive pulmonary disease)     Medications:  Scheduled:    . sodium chloride   Intravenous STAT  . amLODipine  10 mg Oral Daily  . aspirin EC  325 mg Oral Daily  . Fluticasone-Salmeterol  1 puff Inhalation BID  . glipiZIDE  5 mg Oral BID AC  . heparin  4,000 Units Intravenous Once  . hydrALAZINE  25 mg Oral TID  .  HYDROmorphone (DILAUDID) injection  1 mg Intravenous Once  . insulin aspart  0-15 Units Subcutaneous TID WC  . insulin aspart  0-5 Units Subcutaneous QHS  . levothyroxine  112 mcg Oral BH-q7a  . linagliptin  5 mg Oral Daily  . loratadine  10 mg Oral QPM  . meclizine  25 mg Oral BH-q7a  . nitroGLYCERIN  1 inch Topical Once  . olmesartan  40 mg Oral BH-q7a  . pantoprazole  40 mg Oral Daily  . simvastatin  40 mg Oral Daily  . sodium chloride  3 mL Intravenous Q12H  . triamterene-hydrochlorothiazide  1 each Oral BH-q7a  . DISCONTD: levocetirizine  5 mg Oral QPM  . DISCONTD: sodium chloride  3 mL  Intravenous Q12H  . DISCONTD: warfarin  10 mg Oral ONCE-1800  . DISCONTD: warfarin  5-7.5 mg Oral Daily  . DISCONTD: warfarin  5-7.5 mg Oral Daily    Assessment: Ok for protocol  Goal of Therapy:  Heparin level 0.3-0.7 units/ml   Plan:  Heparin 4000 unit bolus, then start Heparin 12 units/kg/hour (1750 units/hr) Heparin level in 6 hours and daily Monitor CBC  Raquel James, [facility] 03/12/2011,4:52 PM

## 2011-03-12 NOTE — ED Provider Notes (Signed)
History   This chart was scribed for Dayton Bailiff, MD scribed by Magnus Sinning. The patient was seen in room [facility] seen at 7:17.    CSN: [id number]  Arrival date & time 03/12/11  4098   First MD Initiated Contact with Patient 03/12/11 [phone]      Chief Complaint  Patient presents with  . Chest Pain    (Consider location/radiation/quality/duration/timing/severity/associated sxs/prior treatment) HPI [name] is a 61 y.o. female who presents to the Emergency Department complaining of constant moderate chest pain with associated myalgias and HA onset approx 4 hours ago. Patient reports that she began having a headache, chest pain, and pain to her arms and shoulders this morning. She says she checked her pulse and that it was at 109. Pt describes her chest pain as starting in the center, but moving into her back. Has a hx of blood clots in lungs, which she states she's had 3 times and currently takes blood thinning medication and albuterol. Adds that she isn't sure if this pain is similar to any pain associated with previous blood clots. Patient was given nitro spray and ASA en route by EMS providing mild relief of her HA.  Denies urinary sx, sob, n/v, vision changes, weakness.   Past Medical History  Diagnosis Date  . Diabetes mellitus   . Hypertension   . Pulmonary embolism   . Hiatal hernia   . Ovarian cancer   . COPD (chronic obstructive pulmonary disease)     Past Surgical History  Procedure Date  . Abdominal surgery   . Cholecystectomy   . Appendectomy   . Cesarean section   . Tonsillectomy   . Breast lumpectomy   . Small intestine surgery 12/2007    Dr Lovell Sheehan  . Closure of abdominal wound/wound vac    . Cataract extraction w/phaco 01/29/2011    Procedure: CATARACT EXTRACTION PHACO AND INTRAOCULAR LENS PLACEMENT (IOC);  Surgeon: Gemma Payor;  Location: [facility];  Service: Ophthalmology;  Laterality: Left;  CDE: 1.81    No family history on file.  History    Substance Use Topics  . Smoking status: Former Games developer  . Smokeless tobacco: Not on file  . Alcohol Use: No   Review of Systems  Constitutional: Negative for fever, activity change, appetite change and fatigue.  HENT: Negative for congestion, sore throat, rhinorrhea, neck pain and neck stiffness.   Respiratory: Negative for cough and shortness of breath.   Cardiovascular: Positive for chest pain and palpitations.  Gastrointestinal: Negative for nausea, vomiting and abdominal pain.  Genitourinary: Negative for dysuria, urgency, frequency and flank pain.  Neurological: Negative for dizziness, weakness, light-headedness, numbness and headaches.  All other systems reviewed and are negative.   10 Systems reviewed and are negative for acute change except as noted in the HPI. Allergies  Codeine; Penicillins; and Sulfa antibiotics  Home Medications   Current Outpatient Rx  Name Route Sig Dispense Refill  . ALBUTEROL SULFATE (2.5 MG/3ML) 0.083% IN NEBU Nebulization Take 2.5 mg by nebulization every 6 (six) hours as needed. For wheezing. Combines with atrovent    . AMLODIPINE BESYLATE 10 MG PO TABS Oral Take 1 tablet (10 mg total) by mouth daily. 30 tablet 0  . ATORVASTATIN CALCIUM 20 MG PO TABS Oral Take 20 mg by mouth every morning.     [name] CETIRIZINE HCL 10 MG PO TABS Oral Take 10 mg by mouth every morning.     [name] ESOMEPRAZOLE MAGNESIUM 40 MG PO CPDR Oral Take 40  mg by mouth daily before breakfast.     . IRON 325 (65 FE) MG PO TABS Oral Take 1 tablet by mouth daily after breakfast.     . FLUTICASONE-SALMETEROL 250-50 MCG/DOSE IN AEPB Inhalation Inhale 1 puff into the lungs 2 (two) times daily. 60 each 0  . GLIPIZIDE-METFORMIN HCL 2.5-500 MG PO TABS Oral Take 1 tablet by mouth 2 (two) times daily before a meal.      . HYDRALAZINE HCL 25 MG PO TABS Oral Take 25 mg by mouth 3 (three) times daily.    [name] HYDROCODONE-ACETAMINOPHEN 5-325 MG PO TABS Oral Take 1 tablet by mouth every 6 (six) hours as  needed. For pain    . IPRATROPIUM BROMIDE 0.02 % IN SOLN Nebulization Take 500 mcg by nebulization every 6 (six) hours as needed. For shortness of breath. Combines it with Albuterol    . LEVOCETIRIZINE DIHYDROCHLORIDE 5 MG PO TABS Oral Take 5 mg by mouth every evening.      [name] LEVOTHYROXINE SODIUM 112 MCG PO TABS Oral Take 112 mcg by mouth every morning.     [name] MECLIZINE HCL 25 MG PO TABS Oral Take 25 mg by mouth every morning.     [name] OLMESARTAN MEDOXOMIL 40 MG PO TABS Oral Take 40 mg by mouth every morning.     [name] PRENATAL MULTIVITAMIN CH Oral Take 1 tablet by mouth daily.      [name] SAXAGLIPTIN HCL 5 MG PO TABS Oral Take 5 mg by mouth every morning.     . TRIAMTERENE-HCTZ 37.5-25 MG PO CAPS Oral Take 1 capsule by mouth every morning.      . WARFARIN SODIUM 2 MG PO TABS Oral Take 5-7 mg by mouth daily. Takes 7 mg on Monday, Wednesday, Friday & Sunday and takes 5 mg all other days Takes 1 tablet with the 5mg  tablet to make dose of 7mg .    . WARFARIN SODIUM 5 MG PO TABS Oral Take 5-7 mg by mouth daily. Takes 7 mg on Monday, Wednesday, Friday & Sunday & takes 5 mg all other days Takes 1 tablet with the 2mg  tablet to make dose of 7mg .      BP 171/76  Pulse 103  Temp 97.7 F (36.5 C)  Resp 16  Ht 5\' 1"  (1.549 m)  Wt 321 lb (145.605 kg)  BMI 60.65 kg/m2  SpO2 97%  Physical Exam  Nursing note and vitals reviewed. Constitutional: She is oriented to person, place, and time. She appears well-developed and well-nourished. No distress.       Morbidly obese  HENT:  Head: Normocephalic and atraumatic.  Eyes: Conjunctivae and EOM are normal. Pupils are equal, round, and reactive to light.  Neck: Neck supple. No tracheal deviation present.  Cardiovascular: Normal rate, regular rhythm, normal heart sounds and intact distal pulses.  Exam reveals no gallop and no friction rub.   No murmur heard. Pulmonary/Chest: Effort normal and breath sounds normal. No respiratory distress. She has no wheezes. She has no  rales. She exhibits no tenderness.  Abdominal: Soft. She exhibits no distension. There is no tenderness.  Musculoskeletal: Normal range of motion. She exhibits no edema.  Neurological: She is alert and oriented to person, place, and time. No sensory deficit.  Skin: Skin is warm and dry.  Psychiatric: She has a normal mood and affect. Her behavior is normal.    ED Course  Procedures (including critical care time)   Date: 03/12/2011  Rate: 103  Rhythm: sinus tachycardia  QRS  Axis: normal  Intervals: normal  ST/T Wave abnormalities: nonspecific ST/T changes  Conduction Disutrbances:none  Narrative Interpretation:   Old EKG Reviewed: unchanged  DIAGNOSTIC STUDIES: Oxygen Saturation is 97% on room air, normal by my interpretation.    COORDINATION OF CARE: Labs Reviewed  CBC - Abnormal; Notable for the following:    WBC 11.0 (*)    MCH 25.5 (*)    All other components within normal limits  COMPREHENSIVE METABOLIC PANEL - Abnormal; Notable for the following:    Glucose, Bld 260 (*)    BUN 31 (*)    Total Bilirubin 0.2 (*)    GFR calc non Af Amer 69 (*)    GFR calc Af Amer 80 (*)    All other components within normal limits  CARDIAC PANEL(CRET KIN+CKTOT+MB+TROPI) - Abnormal; Notable for the following:    Relative Index 3.1 (*)    All other components within normal limits  PROTIME-INR - Abnormal; Notable for the following:    Prothrombin Time 21.6 (*)    INR 1.84 (*)    All other components within normal limits  D-DIMER, QUANTITATIVE - Abnormal; Notable for the following:    D-Dimer, Quant 0.57 (*)    All other components within normal limits   Ct Angio Chest W/cm &/or Wo Cm  03/12/2011  *RADIOLOGY REPORT*  Clinical Data: Chest and arm pain.  History pulmonary embolus. Blood thinner.  Diabetes.  CT ANGIOGRAPHY CHEST  Technique:  Multidetector CT imaging of the chest using the standard protocol during bolus administration of intravenous contrast. Multiplanar reconstructed  images including MIPs were obtained and reviewed to evaluate the vascular anatomy.  Contrast: OMNIPAQUE IOHEXOL 300 MG/ML IV SOLN  Comparison: 08/09/2010.  Findings: No acute pulmonary embolus noted.  Prominent three-vessel coronary artery calcifications. Cardiomegaly.  Prominent epicardial fat.  Aortic valve calcifications.  Calcification of the thoracic aorta. No obvious dissection.  Evaluation limited by cardiac motion.  No aneurysmal dilation of the thoracic aorta.  Calcification origin of the great vessels.  Shifting atelectatic changes lung bases and lingula.  Tiny right apical nodule unchanged.  Mild substernal extension of the left lobe of thyroid gland. Scattered small mediastinal lymph nodes.  Mild fatty infiltration of the liver.  Visualized upper abdominal structures otherwise unremarkable.  Mild degenerative changes thoracic spine without bony destructive lesion.  IMPRESSION: No acute pulmonary embolus.  Prominent three-vessel coronary artery calcifications.  Atherosclerotic type changes thoracic aorta and great vessels.  Cardiomegaly.  Shifting atelectatic changes lung bases and lingula.  Stable tiny right upper lobe nodule.  Original Report Authenticated By: Fuller Canada, M.D.   Dg Chest Portable 1 View  03/12/2011  *RADIOLOGY REPORT*  Clinical Data: Mid chest pain. Shortness of breath.  High blood pressure.  Prior smoker.  History of blood clots.  PORTABLE CHEST - 1 VIEW  Comparison: 08/09/2010 chest CT.  08/08/2010 chest x-ray.  Findings: Mild cardiomegaly.  Central pulmonary vascular prominence.  No segmental infiltrate or gross pneumothorax. Calcified mildly tortuous aorta.  IMPRESSION: Mild cardiomegaly.  Central pulmonary vascular prominence.  No segmental infiltrate or gross pneumothorax.  Calcified mildly tortuous aorta.  Original Report Authenticated By: Fuller Canada, M.D.      1. Chest pain   2. Hyperglycemia   3. Subtherapeutic international normalized ratio (INR)       MDM  Patient subtherapeutic on her INR and elevated d-dimer. CT angioma the chest showed no PE. Patient does have multiple risk factors for coronary artery disease therefore she'll be  admitted to the hospital for a formal rule out. Initial troponin is negative. EKG is unremarkable. She is chest pain-free with nitro paste and Dilaudid. She received aspirin prior to arrival. I personally performed the services described in this documentation, which was scribed in my presence. The recorded information has been reviewed and considered.         Dayton Bailiff, MD 03/12/11 1011

## 2011-03-12 NOTE — ED Notes (Signed)
Pt still rates pain as a 5, pt the full 1mg  dose of dilaudid made her "too loopy" advised that we can try 0.5mg , pt agrees.

## 2011-03-12 NOTE — ED Notes (Signed)
Pt states, " It feels like my left lung is hurting. I've had a blood colt in my lung before."

## 2011-03-13 ENCOUNTER — Other Ambulatory Visit: Payer: Self-pay

## 2011-03-13 ENCOUNTER — Encounter (HOSPITAL_COMMUNITY)
Admission: EM | Disposition: A | Discharge status: Home Health | Payer: Self-pay | Source: Ambulatory Visit | Attending: Cardiology

## 2011-03-13 ENCOUNTER — Encounter (HOSPITAL_COMMUNITY): Payer: Self-pay | Admitting: Internal Medicine

## 2011-03-13 DIAGNOSIS — I2510 Atherosclerotic heart disease of native coronary artery without angina pectoris: Secondary | ICD-10-CM

## 2011-03-13 DIAGNOSIS — R079 Chest pain, unspecified: Secondary | ICD-10-CM

## 2011-03-13 HISTORY — PX: LEFT HEART CATHETERIZATION WITH CORONARY ANGIOGRAM: SHX5451

## 2011-03-13 LAB — CBC
Platelets: 221 10*3/uL (ref 150–400)
RBC: 4.29 MIL/uL (ref 3.87–5.11)
RDW: 15.1 % (ref 11.5–15.5)
WBC: 12.6 10*3/uL — ABNORMAL HIGH (ref 4.0–10.5)

## 2011-03-13 LAB — GLUCOSE, CAPILLARY
Glucose-Capillary: 191 mg/dL — ABNORMAL HIGH (ref 70–99)
Glucose-Capillary: 203 mg/dL — ABNORMAL HIGH (ref 70–99)
Glucose-Capillary: 155 mg/dL — ABNORMAL HIGH (ref 70–99)

## 2011-03-13 LAB — URINALYSIS, ROUTINE W REFLEX MICROSCOPIC
Glucose, UA: NEGATIVE mg/dL
Ketones, ur: NEGATIVE mg/dL
Leukocytes, UA: NEGATIVE
Nitrite: NEGATIVE
Specific Gravity, Urine: <1.005 — ABNORMAL LOW (ref 1.005–1.030)
pH: 5.5 (ref 5.0–8.0)

## 2011-03-13 LAB — BASIC METABOLIC PANEL
BUN: 15 mg/dL (ref 6–23)
CO2: 24 mEq/L (ref 19–32)
Calcium: 8.8 mg/dL (ref 8.4–10.5)
Creatinine, Ser: 0.6 mg/dL (ref 0.50–1.10)
Glucose, Bld: 221 mg/dL — ABNORMAL HIGH (ref 70–99)

## 2011-03-13 LAB — CARDIAC PANEL(CRET KIN+CKTOT+MB+TROPI)
CK, MB: 9.5 ng/mL (ref 0.3–4.0)
Relative Index: 4.9 — ABNORMAL HIGH (ref 0.0–2.5)
Total CK: 194 U/L — ABNORMAL HIGH (ref 7–177)
Troponin I: 2.5 ng/mL (ref <0.30)

## 2011-03-13 LAB — HEMOGLOBIN A1C: Hgb A1c MFr Bld: 6.8 % — ABNORMAL HIGH (ref <5.7)

## 2011-03-13 SURGERY — LEFT HEART CATHETERIZATION WITH CORONARY ANGIOGRAM
Anesthesia: LOCAL

## 2011-03-13 MED ORDER — ALUM & MAG HYDROXIDE-SIMETH 200-200-20 MG/5ML PO SUSP
30.0000 mL | Freq: Four times a day (QID) | ORAL | Status: DC | PRN
  Administered 2011-03-13: 30 mL via ORAL
  Filled 2011-03-13: qty 30

## 2011-03-13 MED ORDER — ACETAMINOPHEN 325 MG PO TABS
650.0000 mg | ORAL_TABLET | ORAL | Status: DC | PRN
  Administered 2011-03-16: 650 mg via ORAL
  Filled 2011-03-13: qty 2

## 2011-03-13 MED ORDER — ASPIRIN EC 325 MG PO TBEC
325.0000 mg | DELAYED_RELEASE_TABLET | Freq: Every day | ORAL | Status: DC
Start: 2011-03-15 — End: 2011-03-15
  Administered 2011-03-15: 325 mg via ORAL
  Filled 2011-03-13: qty 1

## 2011-03-13 MED ORDER — NITROGLYCERIN 0.2 MG/ML ON CALL CATH LAB
INTRAVENOUS | Status: AC
  Filled 2011-03-13: qty 1

## 2011-03-13 MED ORDER — METOPROLOL TARTRATE 12.5 MG HALF TABLET: 6.2500 mg | ORAL_TABLET | Freq: Two times a day (BID) | ORAL | Status: DC

## 2011-03-13 MED ORDER — SODIUM CHLORIDE 0.9 % IJ SOLN
3.0000 mL | Freq: Two times a day (BID) | INTRAMUSCULAR | Status: DC
Start: 2011-03-13 — End: 2011-03-13
  Administered 2011-03-13: 3 mL via INTRAVENOUS

## 2011-03-13 MED ORDER — ACETAMINOPHEN 325 MG PO TABS: 650.0000 mg | ORAL_TABLET | ORAL | Status: DC | PRN

## 2011-03-13 MED ORDER — ONDANSETRON HCL 4 MG/2ML IJ SOLN: 4.0000 mg | Freq: Four times a day (QID) | INTRAMUSCULAR | Status: DC | PRN

## 2011-03-13 MED ORDER — METOPROLOL TARTRATE 25 MG/10 ML ORAL SUSPENSION
6.2500 mg | Freq: Two times a day (BID) | ORAL | Status: DC
  Administered 2011-03-13 (×3): 6.25 mg via ORAL
  Filled 2011-03-13 (×5): qty 2.5

## 2011-03-13 MED ORDER — HEPARIN (PORCINE) IN NACL 2-0.9 UNIT/ML-% IJ SOLN
INTRAMUSCULAR | Status: AC
  Filled 2011-03-13: qty 2000

## 2011-03-13 MED ORDER — DIAZEPAM 5 MG PO TABS
5.0000 mg | ORAL_TABLET | Freq: Once | ORAL | Status: AC
  Administered 2011-03-13: 5 mg via ORAL
  Filled 2011-03-13: qty 1

## 2011-03-13 MED ORDER — LIDOCAINE HCL (PF) 1 % IJ SOLN
INTRAMUSCULAR | Status: AC
  Filled 2011-03-13: qty 30

## 2011-03-13 MED ORDER — ASPIRIN 81 MG PO CHEW: 324.0000 mg | CHEWABLE_TABLET | ORAL | Status: DC

## 2011-03-13 MED ORDER — FENTANYL CITRATE 0.05 MG/ML IJ SOLN
INTRAMUSCULAR | Status: AC
  Filled 2011-03-13: qty 2

## 2011-03-13 MED ORDER — SODIUM CHLORIDE 0.9 % IV SOLN: 250.0000 mL | INTRAVENOUS | Status: DC | PRN

## 2011-03-13 MED ORDER — HEPARIN SODIUM (PORCINE) 1000 UNIT/ML IJ SOLN
INTRAMUSCULAR | Status: AC
  Filled 2011-03-13: qty 1

## 2011-03-13 MED ORDER — SODIUM CHLORIDE 0.9 % IV SOLN: INTRAVENOUS | Status: AC

## 2011-03-13 MED ORDER — MIDAZOLAM HCL 2 MG/2ML IJ SOLN
INTRAMUSCULAR | Status: AC
  Filled 2011-03-13: qty 2

## 2011-03-13 MED ORDER — ALPRAZOLAM 0.25 MG PO TABS
0.2500 mg | ORAL_TABLET | Freq: Two times a day (BID) | ORAL | Status: DC | PRN
  Administered 2011-03-13 – 2011-03-18 (×4): 0.25 mg via ORAL
  Filled 2011-03-13 (×4): qty 1

## 2011-03-13 MED ORDER — ROSUVASTATIN CALCIUM 10 MG PO TABS
10.0000 mg | ORAL_TABLET | Freq: Every day | ORAL | Status: DC
  Administered 2011-03-13: 10 mg via ORAL
  Filled 2011-03-13 (×2): qty 1

## 2011-03-13 MED ORDER — MAGNESIUM HYDROXIDE 400 MG/5ML PO SUSP
30.0000 mL | Freq: Every day | ORAL | Status: DC | PRN
  Filled 2011-03-13: qty 30

## 2011-03-13 MED ORDER — SODIUM CHLORIDE 0.9 % IJ SOLN
3.0000 mL | INTRAMUSCULAR | Status: DC | PRN
  Administered 2011-03-13: 3 mL via INTRAVENOUS

## 2011-03-13 MED ORDER — HEPARIN SOD (PORCINE) IN D5W 100 UNIT/ML IV SOLN
2000.0000 [IU]/h | INTRAVENOUS | Status: DC
  Administered 2011-03-14: 1750 [IU]/h via INTRAVENOUS
  Administered 2011-03-14 – 2011-03-15 (×3): 1900 [IU]/h via INTRAVENOUS
  Administered 2011-03-17 – 2011-03-18 (×3): 2000 [IU]/h via INTRAVENOUS
  Filled 2011-03-13 (×13): qty 250

## 2011-03-13 MED ORDER — VERAPAMIL HCL 2.5 MG/ML IV SOLN
INTRAVENOUS | Status: AC
  Filled 2011-03-13: qty 2

## 2011-03-13 NOTE — Progress Notes (Signed)
Inpatient Diabetes Program Recommendations  AACE/ADA: New Consensus Statement on Inpatient Glycemic Control (2009)  Target Ranges:  Prepandial:   less than 140 mg/dL      Peak postprandial:   less than 180 mg/dL (1-2 hours)      Critically ill patients:  140 - 180 mg/dL   Reason for Visit: Results for JURNEE, NAKAYAMA (MRN [id number]) as of 03/13/2011 09:07  Ref. Range 03/12/2011 16:34 03/12/2011 22:32 03/12/2011 23:51 03/13/2011 08:36  Glucose-Capillary Latest Range: 70-99 mg/dL 119 (H) 147 (H) 829 (H) 203 (H)    Inpatient Diabetes Program Recommendations Insulin - Basal: Consider adding Lantus 15 units q HS while in the hospital and oral agents on hold. HgbA1C: A1C looks good 6.8%, so patient can resume home meds. at discharge.   Note: Will follow.

## 2011-03-13 NOTE — Progress Notes (Signed)
ANTICOAGULATION CONSULT NOTE - Follow Up Consult  Pharmacy Consult for Heparin Indication: NSTEMI s/p cath --> awaiting CVTS consult  Allergies  Allergen Reactions  . Codeine Shortness Of Breath and Itching  . Penicillins Rash  . Sulfa Antibiotics Rash    Patient Measurements: Height: 5\' 1"  (154.9 cm) Weight: 319 lb 0.1 oz (144.7 kg) IBW/kg (Calculated) : 47.8  Heparin Dosing Weight: 85kg  Vital Signs: Temp: 98.2 F (36.8 C) (02/15 1745) Temp src: Oral (02/15 1745) BP: 116/62 mmHg (02/15 1845) Pulse Rate: 85  (02/15 1845)  Labs:  Basename 03/13/11 1045 03/13/11 0948 03/13/11 0939 03/12/11 1427 03/12/11 0722  HGB -- 11.3* -- -- 12.7  HCT -- 34.5* -- -- 40.2  PLT -- 221 -- -- 241  APTT -- -- -- -- --  LABPROT -- 21.0* -- -- 21.6*  INR -- 1.78* -- -- 1.84*  HEPARINUNFRC -- 0.42 -- -- --  CREATININE 0.60 -- -- -- 0.88  CKTOTAL -- -- 194* 290* 112  CKMB -- -- 9.5* 21.4* 3.5  TROPONINI -- -- 2.50* 4.60* <0.30   Estimated Creatinine Clearance: 99.7 ml/min (by C-G formula based on Cr of 0.6).   Medications:  Heparin 1750 units/hr - on hold for cath  Assessment: 60yof admitted with NSTEMI now s/p cath to restart heparin 8hrs post sheath/band removal while awaiting CVTS consult for multivessel disease. Per RN, band will be removed ~ 1900 tonight. Heparin was therapeutic this AM (0.42) on 1750 units/hr. - Hg 11.3, Plts 221 - No significant bleeding reported  Goal of Therapy:  Heparin level 0.3-0.7 units/ml   Plan:  1. Restart heparin 1750 units/hr (17.5 ml/hr) @ 0300 on 2/16 2. Check heparin level and CBC 6hrs after heparin restart 3. Follow up CVTS recommendations  Cleon Dew [phone] 03/13/2011,6:49 PM

## 2011-03-13 NOTE — Progress Notes (Signed)
[name] is an 61 y.o. female.  Chief Complaint: chest pain  HPI: Ms. Schlueter is a very pleasant woman with PMH of T2DM on oral medications for upwards of 20 years, HTN, 3 pulmonary emboli, last several years ago on chronic warfarin therapy, HLD, COPD, Hypothyroidism who comes in with substernal chest pain, some neck fullness/sensation and lateral chest/shoulder pain. Thought to be Atypical but then had increased troponin with concerning EKG. Pain on/off with rest/NTG and morphine. Pain is more of a pressure/achy sensation, not sharp except when it was 10/10 on day of admission.  Pt feels good this morning. But still has chest and L arm pain- 8/10.- relieved by morphine. Denies N/V.    PHYSICAL EXAM Filed Vitals:   03/13/11 0400 03/13/11 0500 03/13/11 0600 03/13/11 0700  BP: 126/60 125/61 139/72 127/63  Pulse: 77 83 87 85  Temp: 99 F (37.2 C)     TempSrc: Oral     Resp: 18 18 17 20   Height:      Weight:      SpO2: 94% 95% 96% 94%   General: NAD, Morbidly obese woman Lungs:  CTA B/L, decrease air entry at bases. Heart:  S1, S2, RRR, no murmur, rubs or gallor. No JVD appreciated, 2+ DP Abdomen:  Soft, obese, BS + Extremities:  No pedal edema.  LABS: Lab Results  Component Value Date   CKTOTAL 290* 03/12/2011   CKMB 21.4* 03/12/2011   TROPONINI 4.60* 03/12/2011   Results for orders placed during the hospital encounter of 03/12/11 (from the past 24 hour(s))  CARDIAC PANEL(CRET KIN+CKTOT+MB+TROPI)     Status: Abnormal   Collection Time   03/12/11  2:27 PM      Component Value Range   Total CK 290 (*) 7 - 177 (U/L)   CK, MB 21.4 (*) 0.3 - 4.0 (ng/mL)   Troponin I 4.60 (*) <0.30 (ng/mL)   Relative Index 7.4 (*) 0.0 - 2.5   TSH     Status: Abnormal   Collection Time   03/12/11  2:27 PM      Component Value Range   TSH 5.151 (*) 0.350 - 4.500 (uIU/mL)  HEMOGLOBIN A1C     Status: Abnormal   Collection Time   03/12/11  2:27 PM      Component Value Range   Hemoglobin A1C 6.8 (*) <5.7 (%)   Mean Plasma Glucose 148 (*) <117 (mg/dL)  HEPATIC FUNCTION PANEL     Status: Normal   Collection Time   03/12/11  2:27 PM      Component Value Range   Total Protein 6.9  6.0 - 8.3 (g/dL)   Albumin 3.6  3.5 - 5.2 (g/dL)   AST 32  0 - 37 (U/L)   ALT 28  0 - 35 (U/L)   Alkaline Phosphatase 64  39 - 117 (U/L)   Total Bilirubin 0.3  0.3 - 1.2 (mg/dL)   Bilirubin, Direct <1.6  0.0 - 0.3 (mg/dL)   Indirect Bilirubin NOT CALCULATED  0.3 - 0.9 (mg/dL)  LIPASE, BLOOD     Status: Normal   Collection Time   03/12/11  2:27 PM      Component Value Range   Lipase 33  11 - 59 (U/L)  LIPID PANEL     Status: Normal   Collection Time   03/12/11  2:28 PM      Component Value Range   Cholesterol 172  0 - 200 (mg/dL)   Triglycerides 109  <604 (mg/dL)  HDL 59  >39 (mg/dL)   Total CHOL/HDL Ratio 2.9     VLDL 25  0 - 40 (mg/dL)   LDL Cholesterol 88  0 - 99 (mg/dL)  GLUCOSE, CAPILLARY     Status: Abnormal   Collection Time   03/12/11  4:34 PM      Component Value Range   Glucose-Capillary 156 (*) 70 - 99 (mg/dL)   Comment 1 Documented in Chart     Comment 2 Notify RN    MRSA PCR SCREENING     Status: Normal   Collection Time   03/12/11  8:58 PM      Component Value Range   MRSA by PCR NEGATIVE  NEGATIVE   GLUCOSE, CAPILLARY     Status: Abnormal   Collection Time   03/12/11 10:32 PM      Component Value Range   Glucose-Capillary 182 (*) 70 - 99 (mg/dL)  GLUCOSE, CAPILLARY     Status: Abnormal   Collection Time   03/12/11 11:51 PM      Component Value Range   Glucose-Capillary 191 (*) 70 - 99 (mg/dL)    Intake/Output Summary (Last 24 hours) at 03/13/11 0805 Last data filed at 03/13/11 0700  Gross per 24 hour  Intake 1868.37 ml  Output    590 ml  Net 1278.37 ml    EKG:  2/14 : ST depression 2/15 NSR. No Ischemic changes.  ASSESSMENT AND PLAN:   1) NSTEMI:  Pt with chest pain with positive enzymes- Tr- 4.6, but no ST changes. 3rd set of CE  pending. LDL- 88, HDL 59 and T.chol 172 CT Angio Chest shows severe 3 vessel disease. Plan: Cardiac Cath today for possible PCI. - Continue Heparin, NTG gtt - Cont BB, ARB and statin    2) HTN (hypertension), benign:  BP well controlled on home meds and NTG.   3) Non Insulin dependent Diabetes mellitus: A1c 6.8. On glipizide and metformin at home. - Will Hold metformin for possible Cath . - SSI    4) Hypothyroidism (acquired): Continue synthroid 112 mcg at home dose.  5)  Hyperlipidemia: LDL- 88, HDL 59 and T.chol 172. - Change to Crestor 40 mg daily.   6) Recurrent pulmonary emboli: 3PE in past. _ hold coumadin for now. _ cont Heparin.  7) Hiatal hernia  8) Obesity, Class III, BMI 40-49.9 (morbid obesity)   PATEL,RAVI 03/13/2011 8:05 AM  Agree with above note.  Spoke with Dr. Excell Seltzer.  Will proceed with cardiac cath later today probably radial approach since INR 1.8.

## 2011-03-13 NOTE — H&P (Signed)
[name] is an 61 y.o. female.   Chief Complaint: chest pain HPI: Ms. Chilcott is a very pleasant woman with PMH of T2DM on oral medications for upwards of 20 years, HTN, 3 pulmonary emboli, last several years ago on chronic warfarin therapy, HLD, COPD who comes in with substernal chest pain, some neck fullness/sensation and lateral chest/shoulder pain. She has had hiatal hernia and associated symptoms/fullness/pain for some time that she initially attributed her current epigastric/SSCP but ultimately was persuaded by family to come to the ER/physician to have more fully evaluated. She ended up getting to [facility] where her risk factors were concerning and she ultimately had negative to positive enzyme changes with some EKG changes leading to treatment for NSTEMI and transfer to [facility]. On my evaluation her chest pain is down from 10 to 1-2, mild, sensation like, no neck sensation but occasional shoulder pain. Pain is resolved with rest/NTG and morphine. Pain is more of a pressure/achy sensation, not sharp except when it was 10/10 yesterday.   Past Medical History  Diagnosis Date  . Diabetes mellitus   . Hypertension   . Pulmonary embolism   . Hiatal hernia   . Ovarian cancer   . COPD (chronic obstructive pulmonary disease)     Past Surgical History  Procedure Date  . Abdominal surgery   . Cholecystectomy   . Appendectomy   . Cesarean section   . Tonsillectomy   . Breast lumpectomy   . Small intestine surgery 12/2007    Dr Lovell Sheehan  . Closure of abdominal wound/wound vac    . Cataract extraction w/phaco 01/29/2011    Procedure: CATARACT EXTRACTION PHACO AND INTRAOCULAR LENS PLACEMENT (IOC);  Surgeon: Gemma Payor;  Location: [facility];  Service: Ophthalmology;  Laterality: Left;  CDE: 1.81   Family history: father with MI at age 63, mother with CABG at age 21 History reviewed. No pertinent family history. Social History:  reports that she has quit smoking. She does  not have any smokeless tobacco history on file. She reports that she does not drink alcohol or use illicit drugs.  Allergies:  Allergies  Allergen Reactions  . Codeine Shortness Of Breath and Itching  . Penicillins Rash  . Sulfa Antibiotics Rash    Medications Prior to Admission  Medication Dose Route Frequency Provider Last Rate Last Dose  . 0.9 %  sodium chloride infusion   Intravenous STAT Dayton Bailiff, MD      . 0.9 %  sodium chloride infusion   Intravenous Continuous Erick Blinks, MD 75 mL/hr at 03/13/11 0000    . acetaminophen (TYLENOL) tablet 650 mg  650 mg Oral Q6H PRN Erick Blinks, MD       Or  . acetaminophen (TYLENOL) suppository 650 mg  650 mg Rectal Q6H PRN Erick Blinks, MD      . albuterol (PROVENTIL) (5 MG/ML) 0.5% nebulizer solution 2.5 mg  2.5 mg Nebulization Q6H PRN Erick Blinks, MD      . amLODipine (NORVASC) tablet 10 mg  10 mg Oral Daily Erick Blinks, MD   10 mg at 03/12/11 1524  . aspirin EC tablet 325 mg  325 mg Oral Daily Erick Blinks, MD   325 mg at 03/12/11 1524  . Fluticasone-Salmeterol (ADVAIR) 250-50 MCG/DOSE inhaler 1 puff  1 puff Inhalation BID Erick Blinks, MD   1 puff at 03/12/11 2226  . glipiZIDE (GLUCOTROL) tablet 5 mg  5 mg Oral BID AC Erick Blinks, MD      .  heparin ADULT infusion 100 units/ml (25000 units/250 ml)  12 Units/kg/hr Intravenous Continuous Erick Blinks, MD 17.5 mL/hr at 03/13/11 0000 11.872 Units/kg/hr at 03/13/11 0000  . heparin bolus via infusion 4,000 Units  4,000 Units Intravenous Once Erick Blinks, MD   4,000 Units at 03/12/11 1657  . hydrALAZINE (APRESOLINE) tablet 25 mg  25 mg Oral TID Erick Blinks, MD   25 mg at 03/12/11 2225  . HYDROcodone-acetaminophen (NORCO) 5-325 MG per tablet 1 tablet  1 tablet Oral Q6H PRN Erick Blinks, MD   1 tablet at 03/12/11 2209  . HYDROmorphone (DILAUDID) injection 1 mg  1 mg Intravenous Once Dayton Bailiff, MD   1 mg at 03/12/11 0801  . insulin aspart (novoLOG) injection 0-15 Units   0-15 Units Subcutaneous TID WC Erick Blinks, MD   3 Units at 03/12/11 1819  . insulin aspart (novoLOG) injection 0-5 Units  0-5 Units Subcutaneous QHS Erick Blinks, MD      . iohexol (OMNIPAQUE) 300 MG/ML solution 100 mL  100 mL Intravenous Once PRN Dayton Bailiff, MD   100 mL at 03/12/11 0856  . ipratropium (ATROVENT) nebulizer solution 500 mcg  500 mcg Nebulization Q6H PRN Erick Blinks, MD      . levothyroxine (SYNTHROID, LEVOTHROID) tablet 112 mcg  112 mcg Oral BH-q7a Erick Blinks, MD      . linagliptin (TRADJENTA) tablet 5 mg  5 mg Oral Daily Erick Blinks, MD      . loratadine (CLARITIN) tablet 10 mg  10 mg Oral QPM Erick Blinks, MD      . meclizine (ANTIVERT) tablet 25 mg  25 mg Oral BH-q7a Erick Blinks, MD      . morphine 2 MG/ML injection 2 mg  2 mg Intravenous Once Erick Blinks, MD   2 mg at 03/12/11 1737  . morphine 2 MG/ML injection 2 mg  2 mg Intravenous Q3H PRN Leeann Must, MD      . morphine 4 MG/ML injection        2 mg at 03/12/11 1729  . nitroGLYCERIN (NITROGLYN) 2 % ointment 1 inch  1 inch Topical Once Dayton Bailiff, MD   1 inch at 03/12/11 1003  . nitroGLYCERIN (NITROSTAT) SL tablet 0.4 mg  0.4 mg Sublingual Q5 min PRN Dayton Bailiff, MD   0.4 mg at 03/12/11 0747  . nitroGLYCERIN 0.2 mg/mL in dextrose 5 % infusion  2-200 mcg/min Intravenous Titrated Erick Blinks, MD 15 mL/hr at 03/13/11 0000 50 mcg/min at 03/13/11 0000  . olmesartan (BENICAR) tablet 40 mg  40 mg Oral BH-q7a Erick Blinks, MD      . ondansetron (ZOFRAN) tablet 4 mg  4 mg Oral Q6H PRN Erick Blinks, MD       Or  . ondansetron (ZOFRAN) injection 4 mg  4 mg Intravenous Q6H PRN Erick Blinks, MD      . pantoprazole (PROTONIX) EC tablet 40 mg  40 mg Oral Daily Erick Blinks, MD      . simvastatin (ZOCOR) tablet 40 mg  40 mg Oral Daily Erick Blinks, MD   40 mg at 03/12/11 1524  . sodium chloride 0.9 % injection 3 mL  3 mL Intravenous Q12H Erick Blinks, MD      . triamterene-hydrochlorothiazide  (DYAZIDE) 37.5-25 MG per capsule 1 each  1 each Oral BH-q7a Erick Blinks, MD      . white petrolatum (VASELINE) gel           . DISCONTD: albuterol (PROVENTIL) (5 MG/ML) 0.5% nebulizer solution 2.5  mg  2.5 mg Nebulization Q4H PRN Dayton Bailiff, MD      . DISCONTD: HYDROmorphone (DILAUDID) injection 1 mg  1 mg Intravenous Q4H PRN Dayton Bailiff, MD   0.5 mg at 03/12/11 1232  . DISCONTD: levocetirizine (XYZAL) tablet 5 mg  5 mg Oral QPM Erick Blinks, MD      . DISCONTD: sodium chloride 0.9 % injection 3 mL  3 mL Intravenous Q12H Dayton Bailiff, MD   10 mL at 03/12/11 1740  . DISCONTD: warfarin (COUMADIN) tablet 10 mg  10 mg Oral ONCE-1800 Erick Blinks, MD      . DISCONTD: warfarin (COUMADIN) tablet 5-7.5 mg  5-7.5 mg Oral Daily Erick Blinks, MD      . DISCONTD: warfarin (COUMADIN) tablet 5-7.5 mg  5-7.5 mg Oral Daily Erick Blinks, MD       Medications Prior to Admission  Medication Sig Dispense Refill  . albuterol (PROVENTIL) (2.5 MG/3ML) 0.083% nebulizer solution Take 2.5 mg by nebulization every 6 (six) hours as needed. For wheezing. Combines with atrovent      . amLODipine (NORVASC) 10 MG tablet Take 1 tablet (10 mg total) by mouth daily.  30 tablet  0  . atorvastatin (LIPITOR) 20 MG tablet Take 20 mg by mouth every morning.       . cetirizine (ZYRTEC) 10 MG tablet Take 10 mg by mouth every morning.       [name] esomeprazole (NEXIUM) 40 MG capsule Take 40 mg by mouth daily before breakfast.       . Ferrous Sulfate (IRON) 325 (65 FE) MG TABS Take 1 tablet by mouth daily after breakfast.       . Fluticasone-Salmeterol (ADVAIR) 250-50 MCG/DOSE AEPB Inhale 1 puff into the lungs 2 (two) times daily.  60 each  0  . glipiZIDE-metformin (METAGLIP) 2.5-500 MG per tablet Take 1 tablet by mouth 2 (two) times daily before a meal.        . ipratropium (ATROVENT) 0.02 % nebulizer solution Take 500 mcg by nebulization every 6 (six) hours as needed. For shortness of breath. Combines it with Albuterol      .  levocetirizine (XYZAL) 5 MG tablet Take 5 mg by mouth every evening.        [name] levothyroxine (SYNTHROID, LEVOTHROID) 112 MCG tablet Take 112 mcg by mouth every morning.       . meclizine (ANTIVERT) 25 MG tablet Take 25 mg by mouth every morning.       . olmesartan (BENICAR) 40 MG tablet Take 40 mg by mouth every morning.       . Prenatal Vit-Fe Fumarate-FA (PRENATAL MULTIVITAMIN) TABS Take 1 tablet by mouth daily.        . saxagliptin HCl (ONGLYZA) 5 MG TABS tablet Take 5 mg by mouth every morning.       . triamterene-hydrochlorothiazide (DYAZIDE) 37.5-25 MG per capsule Take 1 capsule by mouth every morning.        . warfarin (COUMADIN) 2 MG tablet Take 5-7 mg by mouth daily. Takes 7 mg on Monday, Wednesday, Friday & Sunday and takes 5 mg all other days Takes 1 tablet with the 5mg  tablet to make dose of 7mg .      . warfarin (COUMADIN) 5 MG tablet Take 5-7 mg by mouth daily. Takes 7 mg on Monday, Wednesday, Friday & Sunday & takes 5 mg all other days Takes 1 tablet with the 2mg  tablet to make dose of 7mg .         Review  of Systems  Constitutional: Negative for fever, chills and weight loss.  HENT: Positive for neck pain. Negative for hearing loss and tinnitus.   Eyes: Negative for blurred vision, double vision and photophobia.  Respiratory: Positive for shortness of breath. Negative for cough, hemoptysis and sputum production.   Cardiovascular: Positive for chest pain and leg swelling. Negative for palpitations, orthopnea and claudication.  Gastrointestinal: Positive for heartburn, nausea and abdominal pain. Negative for vomiting, constipation and blood in stool.  Genitourinary: Negative for dysuria, urgency and frequency.  Musculoskeletal: Negative for myalgias and falls.  Skin: Negative for itching and rash.  Neurological: Positive for dizziness. Negative for tingling, tremors, sensory change, loss of consciousness and headaches.  Psychiatric/Behavioral: Negative for depression, suicidal  ideas and substance abuse.    Blood pressure 132/61, pulse 89, temperature 98.9 F (37.2 C), temperature source Oral, resp. rate 18, height 5\' 1"  (1.549 m), weight 144.7 kg (319 lb 0.1 oz), SpO2 94.00%. Physical Exam  Nursing note and vitals reviewed. Constitutional: She is oriented to person, place, and time. She appears well-developed and well-nourished. No distress.       Overweight woman in NAD  HENT:  Head: Normocephalic and atraumatic.  Nose: Nose normal.  Mouth/Throat: Oropharynx is clear and moist. No oropharyngeal exudate.  Eyes: Conjunctivae and EOM are normal. Pupils are equal, round, and reactive to light. No scleral icterus.  Neck: Normal range of motion. Neck supple. No tracheal deviation present. No thyromegaly present.  Cardiovascular: Normal rate, regular rhythm, normal heart sounds and intact distal pulses.  Exam reveals no gallop.   No murmur heard. Respiratory: Effort normal and breath sounds normal. No respiratory distress. She has no wheezes. She has no rales.  GI: Soft. Bowel sounds are normal. She exhibits no distension. There is no tenderness. There is no rebound.  Musculoskeletal: Normal range of motion. She exhibits edema.       Trace LEE bilaterally  Neurological: She is alert and oriented to person, place, and time. She has normal reflexes. No cranial nerve deficit. Coordination normal.  Skin: Skin is warm and dry. No rash noted. She is not diaphoretic. No erythema.  Psychiatric: She has a normal mood and affect. Her behavior is normal.    Labs reviewed in epic; K 4.1, creatinine 0.88, Troponin 4.6, CKMB 21.4, LDL 88, HDL 59, Tri 123, inr 1.9, PTT 21.6 EKG: inferolateral ischemia 7/12 TTE with normal EF, calcified mitral annulus  Assessment/Plan Problem List Chest Pain NSTEMI with EKG changes and troponin + Prior pulmonary embolisms on chronic anticoagulation with INR 1.9 Calcified mitral annulus T2 diabetes mellitus COPD Hypertension  NSTEMI:  patient has significant family history, T2DM, thrombotic history, HTN, morbid obesity very concerning for 3v CAD. Currently being treated with aspirin (received 324 mg), heparin gtt. Defer plavix given high probability of surgical disease.  - on telemetry, in ICU, currently sleeping, chest pain free - coronary angiography tomorrow; last TTE with normal EF 7/12 - on ccb, will gradually add low dose metoprolol as able - on statin - NPO - if pain recurs, low threshold to go to cath lab early Prior PE: holding warfarin for procedures, on heparin gtt T2DM: monitor cbgs, heart/low sugar diet HTN: continue on home bp medications   Edan Serratore 03/13/2011, 1:13 AM

## 2011-03-13 NOTE — Progress Notes (Signed)
UR Completed. [name], Chelsey Grenda F [phone]  

## 2011-03-13 NOTE — Procedures (Signed)
   Cardiac Catheterization Procedure Note  Name: [name] MRN: [id number] DOB: 04-20-1950  Procedure: Left Heart Cath, Selective Coronary Angiography, LV angiography  Indication: NSTEMI   Procedural Details: Allens test on the right wrist was positive.  The right wrist was prepped, draped, and anesthetized with 1% lidocaine. Using the modified Seldinger technique, a 5 French sheath was introduced into the right radial artery. 3 mg of verapamil was administered through the sheath, weight-based unfractionated heparin was administered intravenously. 3DRC catheter was used to engage the RCA.  JL4 catheter was used to engage the LCA.  The LV was entered with the pigtail. Catheter exchanges were performed over an exchange length guidewire. There were no immediate procedural complications. A TR band was used for radial hemostasis at the completion of the procedure.  The patient was transferred to the post catheterization recovery area for further monitoring.  Procedural Findings: Hemodynamics: AO 126/80 LV 139/14  Coronary angiography: Coronary dominance: right  Left mainstem: 70-80% distal LM stenosis.   Left anterior descending (LAD): 80% ostial LAD stenosis.  Luminal irregularities in the remainder of the LAD.   Left circumflex (LCx): There was a small ramus.  95% ostial LCx stenosis.  The AV LCx was subtotally occluded after the moderate 1st obtuse marginal.    Right coronary artery (RCA): Diffuse disease in the mid RCA up to about 50% at the takeoff of the 2nd acute marginal.  50% proximal PDA stenosis.   Left ventriculography: Left ventricular systolic function is normal, LVEF is estimated at 60%.   Final Conclusions:  There is complex plaque involving the distal left main, ostial LCx, and ostial LAD.  The mid LCx is subtotally occluded.  The LCx disease is likely the culprit for NSTEMI.  She is no longer having chest pain.  EF is preserved.   Recommendations: Not a good  target for percutaneous revascularization.  However, she is also a high risk candidate for CABG.  Her body habitus is very difficult.  She is unlikely to do well with medical management.  I will restart heparin gtt tonight.  I will consult CVTS for an opinion.    [name] 03/13/2011, 5:19 PM

## 2011-03-13 NOTE — Consult Note (Signed)
CARDIOTHORACIC SURGERY CONSULTATION REPORT  PCP is Dwana Melena, MD, MD Referring Provider is  [name], MD   Reason for consultation:  Left main disease, 3 vessel CAD  HPI:  Patient is a 61 year old morbidly obese white female from [place], [place] admitted to [facility] with unstable symptoms of chest discomfort and palpitations associated with shortness of breath. The patient's symptoms resolved with medical therapy, but she ruled in for acute non-ST segment elevation myocardial infarction based on cardiac enzymes.  The patient was transferred to [facility] where she underwent cath this evening by Dr Shirlee Latch revealing left main disease with severe 3 vessel CAD and preserved left ventricular function.  Cardiothoracic surgical consultation was requested to consider surgical revascularization.  Past Medical History  Diagnosis Date  . Diabetes mellitus   . Hypertension   . Pulmonary embolism   . Hiatal hernia   . Ovarian cancer   . COPD (chronic obstructive pulmonary disease)     Past Surgical History  Procedure Date  . Abdominal surgery   . Cholecystectomy   . Appendectomy   . Cesarean section   . Tonsillectomy   . Breast lumpectomy   . Small intestine surgery 12/2007    Dr Lovell Sheehan  . Closure of abdominal wound/wound vac    . Cataract extraction w/phaco 01/29/2011    Procedure: CATARACT EXTRACTION PHACO AND INTRAOCULAR LENS PLACEMENT (IOC);  Surgeon: Gemma Payor;  Location: [facility];  Service: Ophthalmology;  Laterality: Left;  CDE: 1.81    History reviewed. No pertinent family history.  Social History History  Substance Use Topics  . Smoking status: Former Games developer  . Smokeless tobacco: Not on file  . Alcohol Use: No    Prior to Admission medications   Medication Sig Start Date End Date Taking? Authorizing Provider  albuterol (PROVENTIL) (2.5 MG/3ML) 0.083% nebulizer solution Take 2.5 mg by nebulization every 6 (six) hours as needed. For wheezing. Combines  with atrovent   Yes Historical Provider, MD  amLODipine (NORVASC) 10 MG tablet Take 1 tablet (10 mg total) by mouth daily. 08/11/10 08/11/11 Yes Nimish Normajean Glasgow, MD  atorvastatin (LIPITOR) 20 MG tablet Take 20 mg by mouth every morning.    Yes Historical Provider, MD  cetirizine (ZYRTEC) 10 MG tablet Take 10 mg by mouth every morning.    Yes Historical Provider, MD  esomeprazole (NEXIUM) 40 MG capsule Take 40 mg by mouth daily before breakfast.    Yes Historical Provider, MD  Ferrous Sulfate (IRON) 325 (65 FE) MG TABS Take 1 tablet by mouth daily after breakfast.    Yes Historical Provider, MD  Fluticasone-Salmeterol (ADVAIR) 250-50 MCG/DOSE AEPB Inhale 1 puff into the lungs 2 (two) times daily. 08/11/10  Yes Nimish Normajean Glasgow, MD  glipiZIDE-metformin (METAGLIP) 2.5-500 MG per tablet Take 1 tablet by mouth 2 (two) times daily before a meal.     Yes Historical Provider, MD  hydrALAZINE (APRESOLINE) 25 MG tablet Take 25 mg by mouth 3 (three) times daily.   Yes Historical Provider, MD  HYDROcodone-acetaminophen (NORCO) 5-325 MG per tablet Take 1 tablet by mouth every 6 (six) hours as needed. For pain   Yes Historical Provider, MD  ipratropium (ATROVENT) 0.02 % nebulizer solution Take 500 mcg by nebulization every 6 (six) hours as needed. For shortness of breath. Combines it with Albuterol 08/11/10 08/11/11 Yes Nimish Normajean Glasgow, MD  levocetirizine (XYZAL) 5 MG tablet Take 5 mg by mouth every evening.     Yes Historical  Provider, MD  levothyroxine (SYNTHROID, LEVOTHROID) 112 MCG tablet Take 112 mcg by mouth every morning.    Yes Historical Provider, MD  meclizine (ANTIVERT) 25 MG tablet Take 25 mg by mouth every morning.    Yes Historical Provider, MD  olmesartan (BENICAR) 40 MG tablet Take 40 mg by mouth every morning.    Yes Historical Provider, MD  Prenatal Vit-Fe Fumarate-FA (PRENATAL MULTIVITAMIN) TABS Take 1 tablet by mouth daily.     Yes Historical Provider, MD  saxagliptin HCl (ONGLYZA) 5 MG TABS  tablet Take 5 mg by mouth every morning.    Yes Historical Provider, MD  triamterene-hydrochlorothiazide (DYAZIDE) 37.5-25 MG per capsule Take 1 capsule by mouth every morning.     Yes Historical Provider, MD  warfarin (COUMADIN) 2 MG tablet Take 5-7 mg by mouth daily. Takes 7 mg on Monday, Wednesday, Friday & Sunday and takes 5 mg all other days Takes 1 tablet with the 5mg  tablet to make dose of 7mg .   Yes Historical Provider, MD  warfarin (COUMADIN) 5 MG tablet Take 5-7 mg by mouth daily. Takes 7 mg on Monday, Wednesday, Friday & Sunday & takes 5 mg all other days Takes 1 tablet with the 2mg  tablet to make dose of 7mg .   Yes Historical Provider, MD    Current Facility-Administered Medications  Medication Dose Route Frequency Provider Last Rate Last Dose  . 0.9 %  sodium chloride infusion   Intravenous Continuous Erick Blinks, MD      . 0.9 %  sodium chloride infusion   Intravenous Continuous [name], MD 75 mL/hr at 03/13/11 1900    . acetaminophen (TYLENOL) tablet 650 mg  650 mg Oral Q6H PRN Erick Blinks, MD       Or  . acetaminophen (TYLENOL) suppository 650 mg  650 mg Rectal Q6H PRN Erick Blinks, MD      . albuterol (PROVENTIL) (5 MG/ML) 0.5% nebulizer solution 2.5 mg  2.5 mg Nebulization Q6H PRN Erick Blinks, MD      . ALPRAZolam Prudy Feeler) tablet 0.25 mg  0.25 mg Oral BID PRN Carrolyn Meiers, MD      . amLODipine (NORVASC) tablet 10 mg  10 mg Oral Daily Erick Blinks, MD   10 mg at 03/13/11 1042  . aspirin EC tablet 325 mg  325 mg Oral Daily [name], MD   325 mg at 03/13/11 1042  . aspirin EC tablet 325 mg  325 mg Oral Daily [name], MD      . diazepam (VALIUM) tablet 5 mg  5 mg Oral Once Cassell Clement, MD   5 mg at 03/13/11 1445  . fentaNYL (SUBLIMAZE) 0.05 MG/ML injection           . Fluticasone-Salmeterol (ADVAIR) 250-50 MCG/DOSE inhaler 1 puff  1 puff Inhalation BID Erick Blinks, MD   1 puff at 03/12/11 2226  . glipiZIDE (GLUCOTROL) tablet 5 mg  5 mg Oral BID  AC Erick Blinks, MD      . heparin 1000 UNIT/ML injection           . heparin 2-0.9 UNIT/ML-% infusion           . heparin ADULT infusion 100 units/ml (25000 units/250 ml)  1,750 Units/hr Intravenous Continuous Dannielle Karvonen Dulaney, PHARMD      . hydrALAZINE (APRESOLINE) tablet 25 mg  25 mg Oral TID Erick Blinks, MD   25 mg at 03/13/11 1042  . HYDROcodone-acetaminophen (NORCO) 5-325 MG per tablet 1 tablet  1 tablet Oral  Q6H PRN Erick Blinks, MD   1 tablet at 03/12/11 2209  . insulin aspart (novoLOG) injection 0-15 Units  0-15 Units Subcutaneous TID WC Erick Blinks, MD   3 Units at 03/13/11 1354  . insulin aspart (novoLOG) injection 0-5 Units  0-5 Units Subcutaneous QHS Erick Blinks, MD      . ipratropium (ATROVENT) nebulizer solution 500 mcg  500 mcg Nebulization Q6H PRN Erick Blinks, MD      . levothyroxine (SYNTHROID, LEVOTHROID) tablet 112 mcg  112 mcg Oral Lesle Reek Erick Blinks, MD   112 mcg at 03/13/11 0902  . lidocaine (XYLOCAINE) 1 % injection           . linagliptin (TRADJENTA) tablet 5 mg  5 mg Oral Daily Erick Blinks, MD      . loratadine (CLARITIN) tablet 10 mg  10 mg Oral QPM Erick Blinks, MD      . meclizine (ANTIVERT) tablet 25 mg  25 mg Oral Barrington Ellison, MD   25 mg at 03/13/11 1308  . metoprolol tartrate (LOPRESSOR) 25 mg/10 mL oral suspension 6.25 mg  6.25 mg Oral BID [name], MD   6.25 mg at 03/13/11 1041  . midazolam (VERSED) 2 MG/2ML injection           . morphine 2 MG/ML injection 2 mg  2 mg Intravenous Q3H PRN Leeann Must, MD   2 mg at 03/13/11 1300  . nitroGLYCERIN (NTG ON-CALL) 0.2 mg/mL injection           . nitroGLYCERIN 0.2 mg/mL in dextrose 5 % infusion  2-200 mcg/min Intravenous Titrated Erick Blinks, MD 27 mL/hr at 03/13/11 1900 90 mcg/min at 03/13/11 1900  . olmesartan (BENICAR) tablet 40 mg  40 mg Oral BH-q7a Erick Blinks, MD   40 mg at 03/13/11 6578  . ondansetron (ZOFRAN) tablet 4 mg  4 mg Oral Q6H PRN Erick Blinks, MD        Or  . ondansetron (ZOFRAN) injection 4 mg  4 mg Intravenous Q6H PRN Erick Blinks, MD      . pantoprazole (PROTONIX) EC tablet 40 mg  40 mg Oral Daily Erick Blinks, MD   40 mg at 03/13/11 1041  . rosuvastatin (CRESTOR) tablet 10 mg  10 mg Oral QHS Erick Blinks, MD      . triamterene-hydrochlorothiazide (DYAZIDE) 37.5-25 MG per capsule 1 each  1 each Oral BH-q7a Erick Blinks, MD   1 each at 03/13/11 0902  . verapamil (ISOPTIN) 2.5 MG/ML injection           . white petrolatum (VASELINE) gel           . DISCONTD: 0.9 %  sodium chloride infusion  250 mL Intravenous PRN Lyn Hollingshead, MD      . DISCONTD: acetaminophen (TYLENOL) tablet 650 mg  650 mg Oral Q4H PRN [name], MD      . DISCONTD: aspirin chewable tablet 324 mg  324 mg Oral Pre-Cath Lyn Hollingshead, MD      . DISCONTD: heparin ADULT infusion 100 units/ml (25000 units/250 ml)  12 Units/kg/hr Intravenous Continuous Erick Blinks, MD   [phone] Units/kg/hr at 03/13/11 1400  . DISCONTD: metoprolol tartrate (LOPRESSOR) tablet 12.5 mg  12.5 mg Oral BID Leeann Must, MD      . DISCONTD: ondansetron [facility]) injection 4 mg  4 mg Intravenous Q6H PRN [name], MD      . DISCONTD: simvastatin (ZOCOR) tablet 40 mg  40 mg Oral Daily Erick Blinks, MD  40 mg at 03/12/11 1524  . DISCONTD: sodium chloride 0.9 % injection 3 mL  3 mL Intravenous Q12H Erick Blinks, MD      . DISCONTD: sodium chloride 0.9 % injection 3 mL  3 mL Intravenous Q12H Lyn Hollingshead, MD   3 mL at 03/13/11 0930  . DISCONTD: sodium chloride 0.9 % injection 3 mL  3 mL Intravenous PRN Lyn Hollingshead, MD   3 mL at 03/13/11 0930    Allergies  Allergen Reactions  . Codeine Shortness Of Breath and Itching  . Penicillins Rash  . Sulfa Antibiotics Rash    Review of Systems:  General:  normal appetite, decreased energy  Respiratory:  no cough, no wheezing, no hemoptysis, no pain with inspiration or cough, + exertional shortness of breath   Cardiac:  + mild epigastric chest pain or  tightness prior to admission radiating up to neck, + exertional SOB, no resting SOB, no PND, no orthopnea, + LE edema, + palpitations, no syncope  GI:   no difficulty swallowing, no hematochezia, no hematemesis, no melena, + constipation, + diarrhea, + IBS, + abdominal pain  GU:   no dysuria, no urgency, no frequency   Musculoskeletal: no arthritis, no arthralgia   Vascular:  no pain suggestive of claudication   Neuro:   no symptoms suggestive of TIA's, no seizures, no headaches, no peripheral neuropathy   Endocrine:  Reports CBG's well controlled at home  HEENT:  no loose teeth or painful teeth,  no recent vision changes  Psych:   no anxiety, no depression    Physical Exam:   BP 132/65  Pulse 90  Temp(Src) 98.2 F (36.8 C) (Oral)  Resp 20  Ht 5\' 1"  (1.549 m)  Wt 144.7 kg (319 lb 0.1 oz)  BMI 60.28 kg/m2  SpO2 97%  General:  Morbidly obese in no distress  HEENT:  Unremarkable   Neck:   no JVD, no bruits, no adenopathy   Chest:   clear to auscultation, symmetrical breath sounds, no wheezes, no rhonchi   CV:   RRR, no murmur   Abdomen:  Morbidly obese with huge panniculus, soft, non-tender, no masses   Extremities:  warm, well-perfused, pulses not palpable, mild LE edema  Rectal/GU  Deferred  Neuro:   Grossly non-focal and symmetrical throughout  Skin:   Clean and dry, no rashes, no breakdown   Diagnostic Tests:  Cardiac Catheterization Procedure Note  Name: [name]  MRN: [id number]  DOB: [birth year]  Procedure: Left Heart Cath, Selective Coronary Angiography, LV angiography    Hemodynamics:  AO 126/80  LV 139/14  Coronary angiography:  Coronary dominance: right  Left mainstem: 70-80% distal LM stenosis.  Left anterior descending (LAD): 80% ostial LAD stenosis. Luminal irregularities in the remainder of the LAD.  Left circumflex (LCx): There was a small ramus. 95% ostial LCx stenosis. The AV LCx was subtotally occluded after the moderate 1st obtuse marginal.    Right coronary artery (RCA): Diffuse disease in the mid RCA up to about 50% at the takeoff of the 2nd acute marginal. 50% proximal PDA stenosis.  Left ventriculography: Left ventricular systolic function is normal, LVEF is estimated at 60%.  Final Conclusions: There is complex plaque involving the distal left main, ostial LCx, and ostial LAD. The mid LCx is subtotally occluded. The LCx disease is likely the culprit for NSTEMI. She is no longer having chest pain. EF is preserved.     Impression:  Morbidly obese 61 year old female with multiple medical problems  admitted with unstable angina and cardiac enzymes positive for acute non-ST segment elevation myocardial infarction. The patient has left main disease with severe three-vessel coronary artery disease and preserved left ventricular function. Coronary anatomy is very unfavorable for percutaneous coronary intervention. Based upon Findings there is no question that surgical revascularization would likely provide the best option. However, the patient's morbid obesity is so bad that the risks of surgery will be extremely high.   Plan:  I discussed matters briefly with the patient in her hospital room. Her family is left in the evening. We will discuss alternatives at length tomorrow and proceed with further diagnostic workup to better stratify risks of high risk surgical intervention. All of her questions been addressed.    Salvatore Decent. Cornelius Moras, MD 03/13/2011 7:25 PM

## 2011-03-13 NOTE — Progress Notes (Signed)
CRITICAL VALUE ALERT  Critical value received: CKMB of 9.5 and troponin of 2.50 Date of notification:  03/13/2011  Time of notification: 1045  Critical value read back:yes  Nurse who received alert:  Deneise Lever, RN  MD notified (1st page):  Dr. Allena Katz  Time of first page:  1052  MD notified (2nd page):  Time of second page:  Responding MD:  Dr. Allena Katz Time MD responded: 1100

## 2011-03-13 NOTE — Progress Notes (Signed)
ANTICOAGULATION CONSULT NOTE - Follow Up Consult  Pharmacy Consult for Heparin Indication: chest pain/ACS  Allergies  Allergen Reactions  . Codeine Shortness Of Breath and Itching  . Penicillins Rash  . Sulfa Antibiotics Rash    Patient Measurements: Height: 5\' 1"  (154.9 cm) Weight: 319 lb 0.1 oz (144.7 kg) IBW/kg (Calculated) : 47.8  Heparin Dosing Weight: 85.4kg  Vital Signs: Temp: 97.9 F (36.6 C) (02/15 0832) Temp src: Oral (02/15 0832) BP: 128/60 mmHg (02/15 1000) Pulse Rate: 84  (02/15 1000)  Labs:  Basename 03/13/11 0948 03/13/11 0939 03/12/11 1427 03/12/11 0722  HGB 11.3* -- -- 12.7  HCT 34.5* -- -- 40.2  PLT 221 -- -- 241  APTT -- -- -- --  LABPROT 21.0* -- -- 21.6*  INR 1.78* -- -- 1.84*  HEPARINUNFRC 0.42 -- -- --  CREATININE -- -- -- 0.88  CKTOTAL -- 194* 290* 112  CKMB -- 9.5* 21.4* 3.5  TROPONINI -- 2.50* 4.60* <0.30   Estimated Creatinine Clearance: 90.6 ml/min (by C-G formula based on Cr of 0.88).  Assessment: 60 yoF on heparin gtt for CP/ACS.  Pt also has hx PE on warfarin PTA.  Warfarin on hold d/t cath this afternoon.  INR decreasing, now 1.78. HL therapeutic 0.42. H/H trending down slightly, plts OK. No bleeding/infusion problems noted.  Heparin dosing weight = 85.4kg, however heparin infusion rate started using TBW (147 kg).  Will continue at current rate as level is therapeutic.   Goal of Therapy:  Heparin level 0.3-0.7 units/ml   Plan:  Continue heparin at current rate 1750 units/hr = 17.22ml/hr.  (this is 20.5 units/kg/hr with heparin dosing weight).  Recheck 6 hours HL.  F/u CBC, cath plans.        Remedios Mckone E 03/13/2011,10:59 AM

## 2011-03-14 ENCOUNTER — Inpatient Hospital Stay (HOSPITAL_COMMUNITY): Payer: 59

## 2011-03-14 ENCOUNTER — Other Ambulatory Visit: Payer: Self-pay

## 2011-03-14 DIAGNOSIS — R079 Chest pain, unspecified: Secondary | ICD-10-CM

## 2011-03-14 DIAGNOSIS — Z01810 Encounter for preprocedural cardiovascular examination: Secondary | ICD-10-CM

## 2011-03-14 DIAGNOSIS — I2510 Atherosclerotic heart disease of native coronary artery without angina pectoris: Secondary | ICD-10-CM

## 2011-03-14 DIAGNOSIS — I214 Non-ST elevation (NSTEMI) myocardial infarction: Secondary | ICD-10-CM

## 2011-03-14 LAB — COMPREHENSIVE METABOLIC PANEL
AST: 20 U/L (ref 0–37)
Albumin: 3.2 g/dL — ABNORMAL LOW (ref 3.5–5.2)
Alkaline Phosphatase: 57 U/L (ref 39–117)
BUN: 10 mg/dL (ref 6–23)
CO2: 24 mEq/L (ref 19–32)
Chloride: 103 mEq/L (ref 96–112)
Creatinine, Ser: 0.68 mg/dL (ref 0.50–1.10)
GFR calc non Af Amer: >90 mL/min (ref >90)
Potassium: 3.7 mEq/L (ref 3.5–5.1)
Total Bilirubin: 0.5 mg/dL (ref 0.3–1.2)

## 2011-03-14 LAB — BLOOD GAS, ARTERIAL
Acid-Base Excess: 2.6 mmol/L — ABNORMAL HIGH (ref 0.0–2.0)
Bicarbonate: 26.1 mEq/L — ABNORMAL HIGH (ref 20.0–24.0)
O2 Saturation: 93.9 %
TCO2: 27.2 mmol/L (ref 0–100)
pCO2 arterial: 36.1 mmHg (ref 35.0–45.0)
pO2, Arterial: 60 mmHg — ABNORMAL LOW (ref 80.0–100.0)

## 2011-03-14 LAB — HEMOGLOBIN A1C
Hgb A1c MFr Bld: 6.6 % — ABNORMAL HIGH (ref <5.7)
Mean Plasma Glucose: 143 mg/dL — ABNORMAL HIGH (ref <117)

## 2011-03-14 LAB — LIPID PANEL
Cholesterol: 151 mg/dL (ref 0–200)
HDL: 54 mg/dL (ref >39)
Total CHOL/HDL Ratio: 2.8 RATIO
Triglycerides: 104 mg/dL (ref <150)

## 2011-03-14 LAB — CBC
HCT: 34.7 % — ABNORMAL LOW (ref 36.0–46.0)
HCT: 35 % — ABNORMAL LOW (ref 36.0–46.0)
Hemoglobin: 11.1 g/dL — ABNORMAL LOW (ref 12.0–15.0)
Hemoglobin: 11.5 g/dL — ABNORMAL LOW (ref 12.0–15.0)
MCV: 80.7 fL (ref 78.0–100.0)
MCV: 80.3 fL (ref 78.0–100.0)
RBC: 4.3 MIL/uL (ref 3.87–5.11)
RBC: 4.36 MIL/uL (ref 3.87–5.11)
RDW: 15.1 % (ref 11.5–15.5)
WBC: 12 10*3/uL — ABNORMAL HIGH (ref 4.0–10.5)
WBC: 10.8 10*3/uL — ABNORMAL HIGH (ref 4.0–10.5)

## 2011-03-14 LAB — PREALBUMIN: Prealbumin: 18.8 mg/dL (ref 17.0–34.0)

## 2011-03-14 LAB — PROTIME-INR: INR: 1.48 (ref 0.00–1.49)

## 2011-03-14 LAB — HEPARIN LEVEL (UNFRACTIONATED): Heparin Unfractionated: 0.26 IU/mL — ABNORMAL LOW (ref 0.30–0.70)

## 2011-03-14 LAB — GLUCOSE, CAPILLARY: Glucose-Capillary: 137 mg/dL — ABNORMAL HIGH (ref 70–99)

## 2011-03-14 MED ORDER — INSULIN ASPART 100 UNIT/ML ~~LOC~~ SOLN
10.0000 [IU] | Freq: Three times a day (TID) | SUBCUTANEOUS | Status: DC
  Administered 2011-03-14 – 2011-03-20 (×16): 10 [IU] via SUBCUTANEOUS

## 2011-03-14 MED ORDER — LEVOTHYROXINE SODIUM 150 MCG PO TABS
150.0000 ug | ORAL_TABLET | Freq: Once | ORAL | Status: AC
  Administered 2011-03-14: 150 ug via ORAL
  Filled 2011-03-14: qty 1

## 2011-03-14 MED ORDER — INSULIN ASPART 100 UNIT/ML ~~LOC~~ SOLN
10.0000 [IU] | Freq: Once | SUBCUTANEOUS | Status: AC
  Administered 2011-03-14: 10 [IU] via SUBCUTANEOUS

## 2011-03-14 MED ORDER — ROSUVASTATIN CALCIUM 20 MG PO TABS
20.0000 mg | ORAL_TABLET | Freq: Every day | ORAL | Status: DC
  Administered 2011-03-14 – 2011-03-19 (×6): 20 mg via ORAL
  Filled 2011-03-14 (×7): qty 1

## 2011-03-14 MED ORDER — ASPIRIN EC 325 MG PO TBEC
325.0000 mg | DELAYED_RELEASE_TABLET | Freq: Once | ORAL | Status: DC
  Filled 2011-03-14: qty 1

## 2011-03-14 MED ORDER — CARVEDILOL 6.25 MG PO TABS
6.2500 mg | ORAL_TABLET | Freq: Two times a day (BID) | ORAL | Status: DC
  Administered 2011-03-14 – 2011-03-19 (×12): 6.25 mg via ORAL
  Filled 2011-03-14 (×16): qty 1

## 2011-03-14 MED ORDER — INSULIN GLARGINE 100 UNIT/ML ~~LOC~~ SOLN
20.0000 [IU] | Freq: Every day | SUBCUTANEOUS | Status: DC
  Administered 2011-03-14 – 2011-03-19 (×6): 20 [IU] via SUBCUTANEOUS
  Filled 2011-03-14: qty 3

## 2011-03-14 MED ORDER — LEVOTHYROXINE SODIUM 150 MCG PO TABS
150.0000 ug | ORAL_TABLET | ORAL | Status: DC
  Administered 2011-03-15 – 2011-03-20 (×6): 150 ug via ORAL
  Filled 2011-03-14 (×7): qty 1

## 2011-03-14 MED ORDER — SODIUM CHLORIDE 0.9 % IV SOLN
INTRAVENOUS | Status: DC
  Administered 2011-03-15: 10 mL/h via INTRAVENOUS

## 2011-03-14 NOTE — Progress Notes (Signed)
ANTICOAGULATION PROGRESS NOTE:  PHARMACY  Pharmacy consult for Heparin for NSTEMI s/p Cath.  Assessment: 60yof admitted with NSTEMI and hx of recurrent PEs, now s/p cath.  Heparin restarted.  Current Heparin rate is 1900 units/hr. 6 hour Heparin level reported as 0.37 units/ml.  Goal of Therapy:  Heparin level 0.3-0.7 units/ml.  Plan:  Continue Heparin at 1900 units/hr  Follow up daily heparin levels, CBC.   Christon Gallaway, Elisha Headland, Pharm.D. 03/14/2011 7:24 PM

## 2011-03-14 NOTE — Progress Notes (Signed)
Subjective: Chief Complaint: chest pain  HPI: [name] is a 61 yo woman with PMH of T2DM, HTN, 3 pulmonary emboli whenever she was taken off Coumadin, so she is now on chronic warfarin therapy, HLD, COPD, Hypothyroidism who comes in with substernal chest pain, some neck fullness/sensation and lateral chest/shoulder pain with  Elevated troponin, NSTEMI.   Cardiac cath on 2/15 by Dr. Shirlee Latch: Left mainstem: 70-80% distal LM stenosis.  Left anterior descending (LAD): 80% ostial LAD stenosis. Luminal irregularities in the remainder of the LAD.  Left circumflex (LCx): There was a small ramus. 95% ostial LCx stenosis. The AV LCx was subtotally occluded after the moderate 1st obtuse marginal.  Right coronary artery (RCA): Diffuse disease in the mid RCA up to about 50% at the takeoff of the 2nd acute marginal. 50% proximal PDA stenosis.  Left ventriculography: Left ventricular systolic function is normal, LVEF is estimated at 60%.  Final Conclusions: There is complex plaque involving the distal left main, ostial LCx, and ostial LAD. The mid LCx is subtotally occluded. The LCx disease is likely the culprit for NSTEMI. She is no longer having chest pain. EF is preserved.    She denies any chest pain or SOB this morning.  She still has not made up her mind about surgery yet.  Her husband was in a car wreck last night on the way home so she does not know if he will be able to come for the meeting with [facility] today.     Objective: Vital signs in last 24 hours: Filed Vitals:   03/14/11 0300 03/14/11 0400 03/14/11 0500 03/14/11 0600  BP: 105/59  120/65 116/54  Pulse: 87 90 86 92  Temp: 98.8 F (37.1 C)     TempSrc: Oral     Resp: 23 23 23 22   Height:      Weight:      SpO2: 89% 95% 95% 95%   Weight change: -5 lb 15.9 oz (-2.719 kg)  Intake/Output Summary (Last 24 hours) at 03/14/11 0726 Last data filed at 03/14/11 0600  Gross per 24 hour  Intake 2081.79 ml  Output   2555 ml  Net -473.21 ml     Physical Exam: General: NAD, Morbidly obese woman  Lungs: CTA B/L, decrease air entry at bases.  Heart: S1, S2, RRR, no murmur, rubs or gallor. No JVD appreciated, 2+ DP  Abdomen: Soft, obese, BS +, nondistended  Extremities: No pedal edema Neuro: nonfocal  Lab Results: Basic Metabolic Panel:  Lab 03/14/11 1191 03/13/11 1045  NA 136 136  K 3.7 4.0  CL 103 104  CO2 24 24  GLUCOSE 191* 221*  BUN 10 15  CREATININE 0.68 0.60  CALCIUM 9.0 8.8  MG -- --  PHOS -- --   Liver Function Tests:  Lab 03/14/11 0500 03/12/11 1427  AST 20 32  ALT 20 28  ALKPHOS 57 64  BILITOT 0.5 0.3  PROT 6.2 6.9  ALBUMIN 3.2* 3.6    Lab 03/12/11 1427  LIPASE 33  AMYLASE --   CBC:  Lab 03/14/11 0600 03/13/11 0948  WBC 12.0* 12.6*  NEUTROABS -- --  HGB 11.1* 11.3*  HCT 34.7* 34.5*  MCV 80.7 80.4  PLT 218 221   Cardiac Enzymes:  Lab 03/13/11 0939 03/12/11 1427 03/12/11 0722  CKTOTAL 194* 290* 112  CKMB 9.5* 21.4* 3.5  CKMBINDEX -- -- --  TROPONINI 2.50* 4.60* <0.30   D-Dimer:  Lab 03/12/11 0722  DDIMER 0.57*   CBG:  Lab  03/13/11 2137 03/13/11 1723 03/13/11 1346 03/13/11 0836 03/12/11 2351 03/12/11 2232  GLUCAP 171* 155* 203* 203* 191* 182*   Hemoglobin A1C:  Lab 03/12/11 1427  HGBA1C 6.8*   Fasting Lipid Panel:  Lab 03/14/11 0500  CHOL 151  HDL 54  LDLCALC 76  TRIG 104  CHOLHDL 2.8  LDLDIRECT --   Thyroid Function Tests:  Lab 03/12/11 1427  TSH 5.151*  T4TOTAL --  FREET4 --  T3FREE --  THYROIDAB --   Coagulation:  Lab 03/14/11 0600 03/13/11 0948 03/12/11 0722  LABPROT 18.0* 21.0* 21.6*  INR 1.46 1.78* 1.84*     Lab 03/13/11 2054  COLORURINE YELLOW  LABSPEC <1.005*  PHURINE 5.5  GLUCOSEU NEGATIVE  HGBUR NEGATIVE  BILIRUBINUR NEGATIVE  KETONESUR NEGATIVE  PROTEINUR NEGATIVE  UROBILINOGEN 0.2  NITRITE NEGATIVE  LEUKOCYTESUR NEGATIVE    Micro Results: Recent Results (from the past 240 hour(s))  MRSA PCR SCREENING     Status: Normal    Collection Time   03/12/11  8:58 PM      Component Value Range Status Comment   MRSA by PCR NEGATIVE  NEGATIVE  Final    Studies/Results: Ct Angio Chest W/cm &/or Wo Cm  03/12/2011  *RADIOLOGY REPORT*  Clinical Data: Chest and arm pain.  History pulmonary embolus. Blood thinner.  Diabetes.  CT ANGIOGRAPHY CHEST  Technique:  Multidetector CT imaging of the chest using the standard protocol during bolus administration of intravenous contrast. Multiplanar reconstructed images including MIPs were obtained and reviewed to evaluate the vascular anatomy.  Contrast: OMNIPAQUE IOHEXOL 300 MG/ML IV SOLN  Comparison: 08/09/2010.  Findings: No acute pulmonary embolus noted.  Prominent three-vessel coronary artery calcifications. Cardiomegaly.  Prominent epicardial fat.  Aortic valve calcifications.  Calcification of the thoracic aorta. No obvious dissection.  Evaluation limited by cardiac motion.  No aneurysmal dilation of the thoracic aorta.  Calcification origin of the great vessels.  Shifting atelectatic changes lung bases and lingula.  Tiny right apical nodule unchanged.  Mild substernal extension of the left lobe of thyroid gland. Scattered small mediastinal lymph nodes.  Mild fatty infiltration of the liver.  Visualized upper abdominal structures otherwise unremarkable.  Mild degenerative changes thoracic spine without bony destructive lesion.  IMPRESSION: No acute pulmonary embolus.  Prominent three-vessel coronary artery calcifications.  Atherosclerotic type changes thoracic aorta and great vessels.  Cardiomegaly.  Shifting atelectatic changes lung bases and lingula.  Stable tiny right upper lobe nodule.  Original Report Authenticated By: Fuller Canada, M.D.   Dg Chest Portable 1 View  03/12/2011  *RADIOLOGY REPORT*  Clinical Data: Mid chest pain. Shortness of breath.  High blood pressure.  Prior smoker.  History of blood clots.  PORTABLE CHEST - 1 VIEW  Comparison: 08/09/2010 chest CT.  08/08/2010  chest x-ray.  Findings: Mild cardiomegaly.  Central pulmonary vascular prominence.  No segmental infiltrate or gross pneumothorax. Calcified mildly tortuous aorta.  IMPRESSION: Mild cardiomegaly.  Central pulmonary vascular prominence.  No segmental infiltrate or gross pneumothorax.  Calcified mildly tortuous aorta.  Original Report Authenticated By: Fuller Canada, M.D.   Medications:  Scheduled Meds:   . amLODipine  10 mg Oral Daily  . aspirin EC  325 mg Oral Daily  . aspirin EC  325 mg Oral Daily  . diazepam  5 mg Oral Once  . fentaNYL      . Fluticasone-Salmeterol  1 puff Inhalation BID  . glipiZIDE  5 mg Oral BID AC  . heparin      .  heparin      . hydrALAZINE  25 mg Oral TID  . insulin aspart  0-15 Units Subcutaneous TID WC  . insulin aspart  0-5 Units Subcutaneous QHS  . levothyroxine  112 mcg Oral BH-q7a  . lidocaine      . linagliptin  5 mg Oral Daily  . loratadine  10 mg Oral QPM  . meclizine  25 mg Oral BH-q7a  . metoprolol tartrate  6.25 mg Oral BID  . midazolam      . nitroGLYCERIN      . olmesartan  40 mg Oral BH-q7a  . pantoprazole  40 mg Oral Daily  . rosuvastatin  10 mg Oral QHS  . triamterene-hydrochlorothiazide  1 each Oral BH-q7a  . verapamil      . DISCONTD: aspirin  324 mg Oral Pre-Cath  . DISCONTD: simvastatin  40 mg Oral Daily  . DISCONTD: sodium chloride  3 mL Intravenous Q12H  . DISCONTD: sodium chloride  3 mL Intravenous Q12H   Continuous Infusions:   . sodium chloride Stopped (03/13/11 1800)  . sodium chloride Stopped (03/13/11 2300)  . sodium chloride 250 mL (03/14/11 0015)  . heparin 1,750 Units/hr (03/14/11 0313)  . nitroGLYCERIN 90 mcg/min (03/14/11 0008)  . DISCONTD: heparin Stopped (03/13/11 1450)   PRN Meds:.acetaminophen, albuterol, ALPRAZolam, alum & mag hydroxide-simeth, HYDROcodone-acetaminophen, ipratropium, magnesium hydroxide, morphine injection, ondansetron (ZOFRAN) IV, ondansetron, DISCONTD: sodium chloride, DISCONTD:  acetaminophen, DISCONTD: acetaminophen, DISCONTD: acetaminophen, DISCONTD: ondansetron (ZOFRAN) IV, DISCONTD: ondansetron (ZOFRAN) IV, DISCONTD: sodium chloride Assessment/Plan:  1) NSTEMI:  positive enzymes- Tr- 4.6-->2.5, but no ST changes. CT Angio Chest showed severe 3 vessel disease and cardiac cath showed complex plaque involving the distal left main, ostial LCx, and ostial LAD with preserved EF of 60%. [facility] evaluated patient last night and will have detailed discussion with family today regarding surgery~although she is extremely high risk for surgery. Lipid panel showed: LDL- 76, HDL 54 and T.chol 151 trig 104  - Continue Heparin, NTG gtt  - Cont BB, ARB and statin   2) HTN (hypertension), benign: BP well controlled on home meds and NTG.   3) Non Insulin dependent Diabetes mellitus: A1c 6.8. Not well controlled, CBGs 150-200's.  - Will Hold metformin, glipizide, and linagliptin while in hospital .  -Start Lantus 20 units qhs - meal coverage with Novolog 10 units TID   4) Hypothyroidism (acquired): TSH 5.15, not well-controlled.  -increase synthroid 112 mcg to 150 mcg qAM -Repeat TSH in 6-8 weeks as outpatient  5) Hyperlipidemia: LDL- 76, HDL 54 and T.chol 151 trig 104 - Continue Crestor 40 mg daily.  6) Recurrent pulmonary emboli: 3 PE previously whenever she was taken off Coumadin. This happens after she had breast tumors removed.  - continue to hold coumadin for now in case of surgery.  - cont Heparin gtt  7) Hiatal hernia - stable  8) Obesity, Class III, BMI 40-49.9 (morbid obesity)- need weight loss   LOS: 2 days   HO,[name] 03/14/2011, 7:26 AM  Patient seen with resident, agree with note.  No further chest pain.  She had abdominal discomfort that was relieved by BM.  Today will stop hydralazine and change from metoprolol to Coreg.  Increase Crestor to 20.  Continue heparin gtt (NSTEMI with severe CAD, h/o recurrent PEs).  She has seen Dr. Cornelius Moras and CABG is being  contemplated.  She will be high risk for surgery, but her CAD is not easily amenable to PCI.  I think she will do  poorly with medical management and she is only 55.  I encouraged her to undergo CABG if it is offered, understanding that her options are limited and there is really no low risk/good outcome path to follow.   [name] 03/14/2011 9:52 AM

## 2011-03-14 NOTE — Progress Notes (Signed)
Pre-op Cardiac Surgery  Carotid Findings:  No significant ICA stenosis.  Vertebral artery flow is antegrade.  Upper Extremity Right Left  Brachial Pressures    Radial Waveforms    Ulnar Waveforms    Palmar Arch (Allen's Test)     Findings:      Lower  Extremity Right Left  Dorsalis Pedis    Anterior Tibial    Posterior Tibial    Ankle/Brachial Indices      Findings:

## 2011-03-14 NOTE — Progress Notes (Signed)
Notified by nursing that [facility] is requesting that patient exhibit that she can be mobilized with a request for physical therapy. Discussed the PT order request with Dr. Shirlee Latch, who approved this with close monitoring. Order placed. Alley Neils PA-C

## 2011-03-14 NOTE — Progress Notes (Signed)
   CARDIOTHORACIC SURGERY PROGRESS NOTE  1 Day Post-Op  S/P Procedure(s) (LRB): LEFT HEART CATHETERIZATION WITH CORONARY ANGIOGRAM (N/A)  Subjective: No chest pain.  No SOB.  Objective: Vital signs in last 24 hours: Temp:  [98.2 F (36.8 C)-98.8 F (37.1 C)] 98.3 F (36.8 C) (02/16 1200) Pulse Rate:  [76-92] 78  (02/16 1200) Cardiac Rhythm:  [-] Normal sinus rhythm (02/16 0830) Resp:  [18-24] 22  (02/16 1200) BP: (93-132)/(44-65) 102/49 mmHg (02/16 1200) SpO2:  [89 %-99 %] 95 % (02/16 1200)  Physical Exam:  Rhythm:   sinus  Breath sounds: Diminished at bases  Heart sounds:  RRR  Incisions:  n/a  Abdomen:  obese  Extremities:  Adequately perfused   Intake/Output from previous day: 02/15 0701 - 02/16 0700 In: 2136.3 [P.O.:240; I.V.:1896.3] Out: 2555 [Urine:2555] Intake/Output this shift: Total I/O In: 800.6 [P.O.:480; I.V.:320.6] Out: 1195 [Urine:1195]  Lab Results:  Basename 03/14/11 1010 03/14/11 0600  WBC 10.8* 12.0*  HGB 11.5* 11.1*  HCT 35.0* 34.7*  PLT 225 218   BMET:  Basename 03/14/11 0500 03/13/11 1045  NA 136 136  K 3.7 4.0  CL 103 104  CO2 24 24  GLUCOSE 191* 221*  BUN 10 15  CREATININE 0.68 0.60  CALCIUM 9.0 8.8    CBG (last 3)   Basename 03/14/11 1237 03/14/11 0819 03/13/11 2137  GLUCAP 145* 198* 171*   PT/INR:   Basename 03/14/11 1010  LABPROT 18.2*  INR 1.48    CXR:    CHEST - 2 VIEW  Comparison: 03/12/2011 CT and plain film.  Findings: Both views degraded by patient body habitus. The lateral  is also degraded by patient arm position. Midline trachea.  Cardiomegaly accentuated by AP portable technique. No pleural  effusion or pneumothorax. Low lung volumes with resultant  pulmonary interstitial prominence.  IMPRESSION:  Degraded exam, with cardiomegaly and low lung volumes, but no acute  disease.  Original Report Authenticated By: Consuello Bossier, M.D.    Assessment/Plan: S/P Procedure(s) (LRB): LEFT HEART  CATHETERIZATION WITH CORONARY ANGIOGRAM (N/A)  I spent in excess of 20 minutes observing [name] ambulate in room and speaking with her about treatment options.  Her husband and son were not able to come in for consultation today.  I will plan to meet with them tomorrow if they can come.  However, I am very concerned about the likelihood that she would recover without major morbidity following CABG.  With BMI > 60 she may be above the reasonable limit for surgical intervention.  Given her body habitus and history of recurrent DVT's and PE's in the past she may not have very good saphenous vein conduit for grafting, and I would be very reluctant to use IMA because of concerns regarding healing of her median sternotomy post operatively.  Under the circumstances it might be best to consider PCI if this can be done with acceptable procedural risk.  Will discuss with Dr Shirlee Latch and colleagues and follow up tomorrow with family.  Deneene Tarver H 03/14/2011 2:57 PM

## 2011-03-14 NOTE — Progress Notes (Deleted)
VASCULAR LAB PRELIMINARY  PRELIMINARY  PRELIMINARY  PRELIMINARY  Carotid Dopplers completed.    Preliminary report:  There is no significant ICA stenosis noted.  Vertebral artery flow is antegrade.  [name] [place], 03/14/2011, 10:05 AM

## 2011-03-14 NOTE — Progress Notes (Signed)
ANTICOAGULATION CONSULT NOTE - Follow Up Consult  Pharmacy Consult for Heparin Indication: NSTEMI s/p cath   Assessment: 60yof admitted with NSTEMI and hx of recurrent PEs, now s/p cath restarted heparin 8hrs post sheath/band removal while awaiting CVTS consult for multivessel disease. Heparin was restarted at ~3am today 03/14/11. 6-hour heparin level was slightly below goal at 0.26 IU/mL. No infusion interruptions or issues reported by RN in regards to heparin drip. No bleeding reported in chart. H/H and Plts stable.   Goal of Therapy:  Heparin level 0.3-0.7 units/ml   Plan:  1. Increase IV heparin rate to 1900 units/hr 2. Check 6-hr heparin level 3. F/u Daily HL and CBC  Benjaman Pott, PharmD    Pager [phone] 03/14/2011   11:55 AM  ---  Allergies  Allergen Reactions  . Codeine Shortness Of Breath and Itching  . Penicillins Rash  . Sulfa Antibiotics Rash    Patient Measurements: Height: 5\' 1"  (154.9 cm) Weight: 319 lb 0.1 oz (144.7 kg) IBW/kg (Calculated) : 47.8  Heparin Dosing Weight: 85kg  Vital Signs: Temp: 98.2 F (36.8 C) (02/16 0800) Temp src: Oral (02/16 0800) BP: 109/58 mmHg (02/16 1000) Pulse Rate: 83  (02/16 1000)  Labs:  Basename 03/14/11 1010 03/14/11 0600 03/14/11 0500 03/13/11 1045 03/13/11 0948 03/13/11 0939 03/12/11 1427 03/12/11 0722  HGB 11.5* 11.1* -- -- -- -- -- --  HCT 35.0* 34.7* -- -- 34.5* -- -- --  PLT 225 218 -- -- 221 -- -- --  APTT -- -- -- -- -- -- -- --  LABPROT 18.2* 18.0* -- -- 21.0* -- -- --  INR 1.48 1.46 -- -- 1.78* -- -- --  HEPARINUNFRC 0.26* -- -- -- 0.42 -- -- --  CREATININE -- -- 0.68 0.60 -- -- -- 0.88  CKTOTAL -- -- -- -- -- 194* 290* 112  CKMB -- -- -- -- -- 9.5* 21.4* 3.5  TROPONINI -- -- -- -- -- 2.50* 4.60* <0.30   Estimated Creatinine Clearance: 99.7 ml/min (by C-G formula based on Cr of 0.68).

## 2011-03-15 DIAGNOSIS — I219 Acute myocardial infarction, unspecified: Secondary | ICD-10-CM

## 2011-03-15 LAB — CBC
HCT: 36 % (ref 36.0–46.0)
Hemoglobin: 11.8 g/dL — ABNORMAL LOW (ref 12.0–15.0)
MCH: 26.4 pg (ref 26.0–34.0)
MCHC: 32.8 g/dL (ref 30.0–36.0)
MCV: 80.5 fL (ref 78.0–100.0)
Platelets: 237 10*3/uL (ref 150–400)
RBC: 4.47 MIL/uL (ref 3.87–5.11)
RDW: 14.8 % (ref 11.5–15.5)
WBC: 11.2 10*3/uL — ABNORMAL HIGH (ref 4.0–10.5)

## 2011-03-15 LAB — GLUCOSE, CAPILLARY
Glucose-Capillary: 177 mg/dL — ABNORMAL HIGH (ref 70–99)
Glucose-Capillary: 171 mg/dL — ABNORMAL HIGH (ref 70–99)
Glucose-Capillary: 148 mg/dL — ABNORMAL HIGH (ref 70–99)

## 2011-03-15 LAB — BASIC METABOLIC PANEL
BUN: 13 mg/dL (ref 6–23)
CO2: 25 mEq/L (ref 19–32)
Chloride: 100 mEq/L (ref 96–112)
Creatinine, Ser: 0.77 mg/dL (ref 0.50–1.10)
Glucose, Bld: 141 mg/dL — ABNORMAL HIGH (ref 70–99)

## 2011-03-15 MED ORDER — PRASUGREL HCL 10 MG PO TABS
60.0000 mg | ORAL_TABLET | Freq: Once | ORAL | Status: AC
  Administered 2011-03-15: 60 mg via ORAL
  Filled 2011-03-15: qty 6

## 2011-03-15 MED ORDER — ASPIRIN EC 81 MG PO TBEC
81.0000 mg | DELAYED_RELEASE_TABLET | Freq: Every day | ORAL | Status: DC
  Administered 2011-03-16 – 2011-03-20 (×5): 81 mg via ORAL
  Filled 2011-03-15 (×6): qty 1

## 2011-03-15 MED ORDER — ASPIRIN 81 MG PO TBEC: 81.0000 mg | DELAYED_RELEASE_TABLET | Freq: Every day | ORAL | Status: DC

## 2011-03-15 MED ORDER — PRASUGREL HCL 10 MG PO TABS
10.0000 mg | ORAL_TABLET | Freq: Every day | ORAL | Status: DC
  Administered 2011-03-16 – 2011-03-20 (×5): 10 mg via ORAL
  Filled 2011-03-15 (×6): qty 1

## 2011-03-15 NOTE — Progress Notes (Signed)
Subjective: Chief Complaint: chest pain  HPI: [name] is a 61 yo woman with PMH of T2DM, HTN, 3 pulmonary emboli whenever she was taken off Coumadin, so she is now on chronic warfarin therapy, HLD, COPD, Hypothyroidism who comes in with substernal chest pain, some neck fullness/sensation and lateral chest/shoulder pain with  Elevated troponin, NSTEMI.   Cardiac cath on 2/15 by Dr. Shirlee Latch: Left mainstem: 70-80% distal LM stenosis.  Left anterior descending (LAD): 80% ostial LAD stenosis. Luminal irregularities in the remainder of the LAD.  Left circumflex (LCx): There was a small ramus. 95% ostial LCx stenosis. The AV LCx was subtotally occluded after the moderate 1st obtuse marginal.  Right coronary artery (RCA): Diffuse disease in the mid RCA up to about 50% at the takeoff of the 2nd acute marginal. 50% proximal PDA stenosis.  Left ventriculography: Left ventricular systolic function is normal, LVEF is estimated at 60%.  Final Conclusions: There is complex plaque involving the distal left main, ostial LCx, and ostial LAD. The mid LCx is subtotally occluded. The LCx disease is likely the culprit for NSTEMI. She is no longer having chest pain. EF is preserved.    Patient feels the same. Denies any chest pain or shortness of breath. No nausea or vomiting. Last episode of chest pain yesterday afternoon- relieved with Xanax. Still needs NTG at 75 mcg. Urine output 1.8 L.   Objective: Vital signs in last 24 hours: Filed Vitals:   03/15/11 0400 03/15/11 0500 03/15/11 0600 03/15/11 0700  BP: 96/51 124/67 111/51 124/64  Pulse: 70 71 76 75  Temp:      TempSrc:      Resp: 18 18 21 20   Height:      Weight:      SpO2: 94% 93% 96% 95%   Weight change:   Intake/Output Summary (Last 24 hours) at 03/15/11 0742 Last data filed at 03/15/11 0700  Gross per 24 hour  Intake 1982.13 ml  Output   1846 ml  Net 136.13 ml   Physical Exam: General: NAD, Morbidly obese woman  Lungs: CTA B/L,  decrease air entry at bases.  Heart: S1, S2, RRR, no murmur, rubs or gallor. No JVD appreciated, 2+ DP  Abdomen: Soft, obese, BS +, nondistended  Extremities: No pedal edema Neuro: nonfocal  Lab Results: Basic Metabolic Panel:  Lab 03/15/11 1610 03/14/11 0500  NA 136 136  K 3.7 3.7  CL 100 103  CO2 25 24  GLUCOSE 141* 191*  BUN 13 10  CREATININE 0.77 0.68  CALCIUM 9.5 9.0  MG -- --  PHOS -- --   Liver Function Tests:  Lab 03/14/11 0500 03/12/11 1427  AST 20 32  ALT 20 28  ALKPHOS 57 64  BILITOT 0.5 0.3  PROT 6.2 6.9  ALBUMIN 3.2* 3.6    Lab 03/12/11 1427  LIPASE 33  AMYLASE --   CBC:  Lab 03/15/11 0550 03/14/11 1010  WBC 11.2* 10.8*  NEUTROABS -- --  HGB 11.8* 11.5*  HCT 36.0 35.0*  MCV 80.5 80.3  PLT 237 225   Cardiac Enzymes:  Lab 03/13/11 0939 03/12/11 1427 03/12/11 0722  CKTOTAL 194* 290* 112  CKMB 9.5* 21.4* 3.5  CKMBINDEX -- -- --  TROPONINI 2.50* 4.60* <0.30   D-Dimer:  Lab 03/12/11 0722  DDIMER 0.57*   CBG:  Lab 03/14/11 2130 03/14/11 1731 03/14/11 1237 03/14/11 0819 03/13/11 2137 03/13/11 1723  GLUCAP 137* 159* 145* 198* 171* 155*   Hemoglobin A1C:  Lab 03/14/11  0500  HGBA1C 6.6*   Fasting Lipid Panel:  Lab 03/14/11 0500  CHOL 151  HDL 54  LDLCALC 76  TRIG 104  CHOLHDL 2.8  LDLDIRECT --   Thyroid Function Tests:  Lab 03/12/11 1427  TSH 5.151*  T4TOTAL --  FREET4 --  T3FREE --  THYROIDAB --   Coagulation:  Lab 03/15/11 0550 03/14/11 1010 03/14/11 0600 03/13/11 0948  LABPROT 15.5* 18.2* 18.0* 21.0*  INR 1.20 1.48 1.46 1.78*     Lab 03/13/11 2054  COLORURINE YELLOW  LABSPEC <1.005*  PHURINE 5.5  GLUCOSEU NEGATIVE  HGBUR NEGATIVE  BILIRUBINUR NEGATIVE  KETONESUR NEGATIVE  PROTEINUR NEGATIVE  UROBILINOGEN 0.2  NITRITE NEGATIVE  LEUKOCYTESUR NEGATIVE    Micro Results: Recent Results (from the past 240 hour(s))  MRSA PCR SCREENING     Status: Normal   Collection Time   03/12/11  8:58 PM      Component  Value Range Status Comment   MRSA by PCR NEGATIVE  NEGATIVE  Final    Studies/Results: Dg Chest 2 View  03/14/2011  *RADIOLOGY REPORT*  Clinical Data: Preop for CABG.  Hypertension and diabetes.  Ex- smoker.  COPD.  CHEST - 2 VIEW  Comparison: 03/12/2011 CT and plain film.  Findings: Both views degraded by patient body habitus.  The lateral is also degraded by patient arm position. Midline trachea. Cardiomegaly accentuated by AP portable technique.  No pleural effusion or pneumothorax.  Low lung volumes with resultant pulmonary interstitial prominence.  IMPRESSION: Degraded exam, with cardiomegaly and low lung volumes, but no acute disease.  Original Report Authenticated By: Consuello Bossier, M.D.   Medications:  Scheduled Meds:    . amLODipine  10 mg Oral Daily  . aspirin EC  325 mg Oral Daily  . aspirin EC  325 mg Oral Once  . carvedilol  6.25 mg Oral BID WC  . Fluticasone-Salmeterol  1 puff Inhalation BID  . insulin aspart  0-5 Units Subcutaneous QHS  . insulin aspart  10 Units Subcutaneous TID WC  . insulin aspart  10 Units Subcutaneous Once  . insulin glargine  20 Units Subcutaneous QHS  . levothyroxine  150 mcg Oral BH-q7a  . levothyroxine  150 mcg Oral Once  . loratadine  10 mg Oral QPM  . meclizine  25 mg Oral BH-q7a  . olmesartan  40 mg Oral BH-q7a  . pantoprazole  40 mg Oral Daily  . rosuvastatin  20 mg Oral QHS  . triamterene-hydrochlorothiazide  1 each Oral BH-q7a  . DISCONTD: glipiZIDE  5 mg Oral BID AC  . DISCONTD: hydrALAZINE  25 mg Oral TID  . DISCONTD: insulin aspart  0-15 Units Subcutaneous TID WC  . DISCONTD: levothyroxine  112 mcg Oral BH-q7a  . DISCONTD: linagliptin  5 mg Oral Daily  . DISCONTD: metoprolol tartrate  6.25 mg Oral BID  . DISCONTD: rosuvastatin  10 mg Oral QHS   Continuous Infusions:    . sodium chloride Stopped (03/13/11 1800)  . sodium chloride 10 mL/hr at 03/14/11 2000  . heparin 1,900 Units/hr (03/14/11 2000)  . nitroGLYCERIN 75 mcg/min  (03/15/11 0438)   PRN Meds:.acetaminophen, albuterol, ALPRAZolam, alum & mag hydroxide-simeth, HYDROcodone-acetaminophen, ipratropium, magnesium hydroxide, morphine injection, ondansetron (ZOFRAN) IV, ondansetron Assessment/Plan:  1) NSTEMI:  positive enzymes- Tr- 4.6-->2.5, but no ST changes. CT Angio Chest showed severe 3 vessel disease and cardiac cath showed complex plaque involving the distal left main, ostial LCx, and ostial LAD with preserved EF of 60%. [facility] evaluated patient -  high risk for surgery and postop complications. Recommends PCI if possible.  Lipid panel showed: LDL- 76, HDL 54 and T.chol 151 trig 104  - Continue Heparin, NTG gtt  - Cont BB, ARB and statin  - consider transferring to step down.  2) HTN (hypertension), benign: BP well controlled on home meds and NTG.   3) Non Insulin dependent Diabetes mellitus: A1c 6.8. Not well controlled, CBGs 130-190's.  - Will Hold metformin, glipizide, and linagliptin while in hospital .  -Start Lantus 20 units qhs - meal coverage with Novolog 10 units TID   4) Hypothyroidism (acquired): TSH 5.15, not well-controlled.  -increase synthroid 112 mcg to 150 mcg qAM -Repeat TSH in 6-8 weeks as outpatient  5) Hyperlipidemia: LDL- 76, HDL 54 and T.chol 151 trig 104 - Continue Crestor 40 mg daily.   6) Recurrent pulmonary emboli: 3 PE previously whenever she was taken off Coumadin. This happens after she had breast tumors removed.  - continue to hold coumadin for now in case of surgery.  - cont Heparin gtt  7) Hiatal hernia - stable  8) Obesity, Class III, BMI 40-49.9 (morbid obesity)- need weight loss   LOS: 3 days   PATEL,RAVI MD 03/15/2011, 7:42 AM  Patient seen with resident, agree with note.  Reviewed Dr. Orvan July note and talked with family today.  She is very high risk for CABG and likely would not be able to get LIMA.  There is no great option here but given risk of CABG will aim towards PCI at this point.  As I have written  before, I think she would be very high risk for medical management and we need to do something if possible. I have discussed with Dr. Excell Seltzer who is planning an Impella case.   [name] 03/15/2011 11:07 AM

## 2011-03-15 NOTE — Progress Notes (Signed)
*  PRELIMINARY RESULTS* Echocardiogram 2D Echocardiogram has been performed.  [name] [facility] 03/15/2011, 2:42 PM

## 2011-03-15 NOTE — Progress Notes (Signed)
Asked to consider high-risk PCI on this patient. I have reviewed her cath films in detail and have discussed her case with Dr Cornelius Moras and Dr Shirlee Latch. She has critical disease of the distal LM bifurcation with normal LV systolic function. She is at prohibitive risk of cardiac surgery because of supermorbid obesity with BMI 60, limited mobility, hx DVT/PE, and risk of poor healing/post-op infection. I believe PCI is a reasonable alternative, albeit at much higher-risk than normal. I have reviewed the risks of PCI with patient and her family, including but not limited to vascular injury, stroke, MI, and death. I think the risk of major adverse outcome approaches 10% and they understand this. However, medical therapy is not an option in this situation unless the patient wants palliative care which she does not want at this point.   She is unable to lie flat on the cath table and she understands this will be a much longer procedure than her diagnostic procedure. It will also need to be done with hemodynamic support via femoral artery access, either IABP or Impella. It will be much safer to do this under the controlled environment of general anesthesia and I will coordinate this with anesthesia.  The patient and family members questions were answered and they understand all of the pertinent issues. I will load her with Effient today and will check P2Y12 testing Tuesday. Procedure will be done Wednesday. She may have to remain off of coumadin as she needs DAPT after left main bifurcation stenting. She states her last PE was in the 1980's and she has been on warfarin ever since that time.  Tonny Bollman 03/15/2011 11:35 AM

## 2011-03-15 NOTE — Progress Notes (Signed)
Physical Therapy Evaluation Patient Details Name: [name] MRN: [id number] DOB: 20-May-1950 Today's Date: 03/15/2011  Problem List:  Patient Active Problem List  Diagnoses  . Bronchitis with bronchospasm  . Obesity, Class III, BMI 40-49.9 (morbid obesity)  . HTN (hypertension), benign  . Diabetes mellitus  . Hypothyroidism (acquired)  . Hyperlipidemia  . Pulmonary embolism  . Personal history of ovarian cancer  . Chest pain  . Recurrent pulmonary emboli  . Hiatal hernia  . NSTEMI (non-ST elevated myocardial infarction)    Past Medical History:  Past Medical History  Diagnosis Date  . Diabetes mellitus   . Hypertension   . Pulmonary embolism   . Hiatal hernia   . Ovarian cancer   . COPD (chronic obstructive pulmonary disease)    Past Surgical History:  Past Surgical History  Procedure Date  . Abdominal surgery   . Cholecystectomy   . Appendectomy   . Cesarean section   . Tonsillectomy   . Breast lumpectomy   . Small intestine surgery 12/2007    Dr Lovell Sheehan  . Closure of abdominal wound/wound vac    . Cataract extraction w/phaco 01/29/2011    Procedure: CATARACT EXTRACTION PHACO AND INTRAOCULAR LENS PLACEMENT (IOC);  Surgeon: Gemma Payor;  Location: [facility];  Service: Ophthalmology;  Laterality: Left;  CDE: 1.81    PT Assessment/Plan/Recommendation PT Assessment Clinical Impression Statement: Pt is a 61 y/o female admitted with chest pain as well as the below PT problem list.  Pt would benefit from acute PT to maximize independence and facilitate d/c home with family support.  Discussed pt with cardiology MD prior to evaluation, who was agreeable with PT assisting pt with "gentle mobility" and stopping if pt "exerting too much."  Will continue to mobilize pt to increase endurance as able.  Recommend cardiac rehab phase I to also follow along with pt. PT Recommendation/Assessment: Patient will need skilled PT in the acute care venue PT Problem List: Decreased  strength;Decreased activity tolerance;Decreased balance;Decreased mobility;Cardiopulmonary status limiting activity Barriers to Discharge: None PT Therapy Diagnosis : Generalized weakness;Difficulty walking PT Plan PT Frequency: Min 3X/week PT Treatment/Interventions: Gait training;Stair training;Functional mobility training;Therapeutic activities;Balance training;Patient/family education PT Recommendation Follow Up Recommendations: Other (comment) (Cardiac Rehab follow-up.) Equipment Recommended: None recommended by PT PT Goals  Acute Rehab PT Goals PT Goal Formulation: With patient Time For Goal Achievement: 7 days Pt will go Supine/Side to Sit: with modified independence PT Goal: Supine/Side to Sit - Progress: Goal set today Pt will go Sit to Supine/Side: with modified independence PT Goal: Sit to Supine/Side - Progress: Goal set today Pt will go Sit to Stand: with modified independence PT Goal: Sit to Stand - Progress: Goal set today Pt will go Stand to Sit: with modified independence PT Goal: Stand to Sit - Progress: Goal set today Pt will Ambulate: >150 feet;with modified independence;with least restrictive assistive device PT Goal: Ambulate - Progress: Goal set today Pt will Go Up / Down Stairs: 1-2 stairs;with modified independence;with least restrictive assistive device PT Goal: Up/Down Stairs - Progress: Goal set today Pt will Perform Home Exercise Program: Independently PT Goal: Perform Home Exercise Program - Progress: Goal set today  PT Evaluation Precautions/Restrictions  Precautions Precautions: Fall Required Braces or Orthoses: No Restrictions Weight Bearing Restrictions: No Prior Functioning  Home Living Lives With: Spouse;Son Baldwin Help From: Family Type of Home: House Home Layout: One level Home Access: Stairs to enter Entrance Stairs-Rails: None Entrance Stairs-Number of Steps: 1 Home Adaptive Equipment: Walker - rolling  Prior Function Level of  Independence: Independent with basic ADLs;Independent with gait;Independent with transfers;Needs assistance with homemaking Able to Take Stairs?: Yes Driving: No Vocation: Retired Producer, television/film/video: Awake/alert Overall Cognitive Status: Appears within functional limits for tasks assessed Sensation/Coordination Sensation Light Touch: Appears Intact Stereognosis: Not tested Hot/Cold: Not tested Proprioception: Not tested Coordination Gross Motor Movements are Fluid and Coordinated: Yes Fine Motor Movements are Fluid and Coordinated: Yes Extremity Assessment RUE Assessment RUE Assessment: Within Functional Limits LUE Assessment LUE Assessment: Within Functional Limits RLE Assessment RLE Assessment: Within Functional Limits LLE Assessment LLE Assessment: Within Functional Limits Mobility (including Balance) Bed Mobility Bed Mobility: No Transfers Transfers: Yes Sit to Stand: 5: Supervision;With upper extremity assist;From chair/3-in-1 (3 trials.) Sit to Stand Details (indicate cue type and reason): Verbal cues for hand placement. Stand to Sit: 5: Supervision;With upper extremity assist;To chair/3-in-1 (3 trials.) Stand to Sit Details: Verbal cues for hand placement. Ambulation/Gait Ambulation/Gait: Yes Ambulation/Gait Assistance: 4: Min assist (Min (guard)) Ambulation/Gait Assistance Details (indicate cue type and reason): Guarding for balance only.   Ambulation Distance (Feet): 25 Feet (Distance limited by signs of exertion and MD orders.) Assistive device: None Gait Pattern: Decreased step length - right;Decreased step length - left Stairs: No Wheelchair Mobility Wheelchair Mobility: No  Posture/Postural Control Posture/Postural Control: No significant limitations Balance Balance Assessed: No Exercise  General Exercises - Lower Extremity Ankle Circles/Pumps: AROM;Both;10 reps;Supine Quad Sets: AROM;Both;10 reps;Supine Heel Slides: AROM;Both;10  reps;Supine Hip ABduction/ADduction: AROM;Both;10 reps;Supine Straight Leg Raises: AROM;Both;10 reps;Supine End of Session PT - End of Session Activity Tolerance: Patient tolerated treatment well Patient left: in chair;with call bell in reach Nurse Communication: Mobility status for transfers;Mobility status for ambulation General Behavior During Session: [facility] for tasks performed Cognition: [facility] for tasks performed  Cephus Shelling 03/15/2011, 1:21 PM  03/15/2011 Cephus Shelling, PT, DPT [phone]

## 2011-03-15 NOTE — Progress Notes (Signed)
ANTICOAGULATION CONSULT NOTE - Follow Up Consult  Pharmacy Consult for Heparin Indication: NSTEMI s/p cath  Assessment: 60yof admitted with NSTEMI and hx of recurrent PE (on long-term Coumadin PTA--now on hold), now s/p cath with possible PCI on Wednesday. Heparin level is therapeutic at current rate of 1900 units/hr. H/H and Plts stable. No bleeding or issues noted in the chart.  Goal of Therapy:  Heparin level 0.3-0.7 units/ml   Plan:  1) Continue IV heparin at current rate of 1900 units/hr 2) F/u daily heparin level and CBC in AM  Benjaman Pott, PharmD     Pager [phone] 03/15/2011   3:09 PM   Labs:  [name] 03/15/11 0550 03/14/11 1755 03/14/11 1010 03/14/11 0600 03/14/11 0500 03/13/11 1045 03/13/11 0939  HGB 11.8* -- 11.5* -- -- -- --  HCT 36.0 -- 35.0* 34.7* -- -- --  PLT 237 -- 225 218 -- -- --  APTT -- -- -- -- -- -- --  LABPROT 15.5* -- 18.2* 18.0* -- -- --  INR 1.20 -- 1.48 1.46 -- -- --  HEPARINUNFRC 0.30 0.37 0.26* -- -- -- --  CREATININE 0.77 -- -- -- 0.68 0.60 --  CKTOTAL -- -- -- -- -- -- 194*  CKMB -- -- -- -- -- -- 9.5*  TROPONINI -- -- -- -- -- -- 2.50*   Estimated Creatinine Clearance: 99.7 ml/min (by C-G formula based on Cr of 0.77).

## 2011-03-15 NOTE — Progress Notes (Signed)
   CARDIOTHORACIC SURGERY PROGRESS NOTE  2 Days Post-Op  S/P Procedure(s) (LRB): LEFT HEART CATHETERIZATION WITH CORONARY ANGIOGRAM (N/A)  Subjective: No chest pain  Objective: Vital signs in last 24 hours: Temp:  [98 F (36.7 C)-99.7 F (37.6 C)] 98.1 F (36.7 C) (02/17 1200) Pulse Rate:  [65-89] 71  (02/17 1200) Cardiac Rhythm:  [-] Normal sinus rhythm (02/17 0815) Resp:  [13-24] 22  (02/17 1200) BP: (94-160)/(45-67) 135/59 mmHg (02/17 1200) SpO2:  [92 %-99 %] 96 % (02/17 1200)  Physical Exam:  Rhythm:   sinus  Breath sounds: clear  Heart sounds:  RRR  Incisions:  n/a  Abdomen:  soft  Extremities:  warm   Intake/Output from previous day: 02/16 0701 - 02/17 0700 In: 1982.1 [P.O.:720; I.V.:1262.1] Out: 1845 [Urine:1845] Intake/Output this shift: Total I/O In: 492.3 [P.O.:240; I.V.:252.3] Out: -   Lab Results:  Basename 03/15/11 0550 03/14/11 1010  WBC 11.2* 10.8*  HGB 11.8* 11.5*  HCT 36.0 35.0*  PLT 237 225   BMET:  Basename 03/15/11 0550 03/14/11 0500  NA 136 136  K 3.7 3.7  CL 100 103  CO2 25 24  GLUCOSE 141* 191*  BUN 13 10  CREATININE 0.77 0.68  CALCIUM 9.5 9.0    CBG (last 3)   Basename 03/15/11 1227 03/15/11 0826 03/14/11 2130  GLUCAP 171* 177* 137*   PT/INR:   Basename 03/15/11 0550  LABPROT 15.5*  INR 1.20    CXR:  N/A  Assessment/Plan: S/P Procedure(s) (LRB): LEFT HEART CATHETERIZATION WITH CORONARY ANGIOGRAM (N/A)  I agree with plans for high risk PCI.  I do not think Mrs Baray would do well with CABG.  Please call if we can be of further assistance.  Kori Goins H 03/15/2011 1:15 PM

## 2011-03-16 ENCOUNTER — Encounter (HOSPITAL_COMMUNITY): Payer: 59

## 2011-03-16 DIAGNOSIS — I214 Non-ST elevation (NSTEMI) myocardial infarction: Secondary | ICD-10-CM

## 2011-03-16 LAB — BASIC METABOLIC PANEL
BUN: 15 mg/dL (ref 6–23)
Chloride: 103 mEq/L (ref 96–112)
GFR calc Af Amer: >90 mL/min (ref >90)
Potassium: 3.7 mEq/L (ref 3.5–5.1)

## 2011-03-16 LAB — CBC
Hemoglobin: 11.8 g/dL — ABNORMAL LOW (ref 12.0–15.0)
RBC: 4.44 MIL/uL (ref 3.87–5.11)

## 2011-03-16 LAB — GLUCOSE, CAPILLARY
Glucose-Capillary: 139 mg/dL — ABNORMAL HIGH (ref 70–99)
Glucose-Capillary: 198 mg/dL — ABNORMAL HIGH (ref 70–99)

## 2011-03-16 LAB — PROTIME-INR
INR: 1.13 (ref 0.00–1.49)
Prothrombin Time: 14.7 seconds (ref 11.6–15.2)

## 2011-03-16 LAB — HEPARIN LEVEL (UNFRACTIONATED): Heparin Unfractionated: 0.3 IU/mL (ref 0.30–0.70)

## 2011-03-16 MED ORDER — LOPERAMIDE HCL 2 MG PO CAPS
2.0000 mg | ORAL_CAPSULE | ORAL | Status: DC | PRN
  Administered 2011-03-16 – 2011-03-20 (×2): 2 mg via ORAL
  Filled 2011-03-16 (×2): qty 1

## 2011-03-16 MED FILL — Perflutren Lipid Microsphere IV Susp 6.52 MG/ML: INTRAVENOUS | Qty: 2 | Status: AC

## 2011-03-16 NOTE — Progress Notes (Signed)
ANTICOAGULATION CONSULT NOTE - Follow Up Consult  Pharmacy Consult for Heparin Indication: NSTEMI s/p cath  Assessment: 60yof admitted with NSTEMI and hx of recurrent PE (on long-term Coumadin PTA--now on hold), now s/p cath with possible PCI on Wednesday. Heparin level is  Therapeutic @ 0.3 at current rate of 1900 units/hr. H/H and Plts stable. No bleeding or issues noted in the chart.  Will increase heparin rate slightly to stay above goal.  Heparin dosing weight = 85.4kg.    Goal of Therapy:  Heparin level 0.3-0.7 units/ml   Plan:  1) Increase heparin to 2000 units/hr.   2) F/u daily heparin level and CBC in AM  Fed Ceci, PharmD 03/16/2011 1:45 PM  Pager: [phone]   Labs:  Basename 03/16/11 0452 03/15/11 0550 03/14/11 1755 03/14/11 1010 03/14/11 0500  HGB 11.8* 11.8* -- -- --  HCT 35.9* 36.0 -- 35.0* --  PLT 222 237 -- 225 --  APTT -- -- -- -- --  LABPROT 14.7 15.5* -- 18.2* --  INR 1.13 1.20 -- 1.48 --  HEPARINUNFRC 0.30 0.30 0.37 -- --  CREATININE 0.75 0.77 -- -- 0.68  CKTOTAL -- -- -- -- --  CKMB -- -- -- -- --  TROPONINI -- -- -- -- --   Estimated Creatinine Clearance: 99.7 ml/min (by C-G formula based on Cr of 0.75).

## 2011-03-16 NOTE — Progress Notes (Signed)
Subjective: HPI: [name] is a 61 yo woman with PMH of T2DM, HTN, 3 pulmonary emboli whenever she was taken off Coumadin, so she is now on chronic warfarin therapy, HLD, COPD, Hypothyroidism who comes in with substernal chest pain, some neck fullness/sensation and lateral chest/shoulder pain with Elevated troponin, NSTEMI.   Cardiac cath on 2/15 by Dr. Shirlee Latch:  Left mainstem: 70-80% distal LM stenosis.  Left anterior descending (LAD): 80% ostial LAD stenosis. Luminal irregularities in the remainder of the LAD.  Left circumflex (LCx): There was a small ramus. 95% ostial LCx stenosis. The AV LCx was subtotally occluded after the moderate 1st obtuse marginal.  Right coronary artery (RCA): Diffuse disease in the mid RCA up to about 50% at the takeoff of the 2nd acute marginal. 50% proximal PDA stenosis.  Left ventriculography: Left ventricular systolic function is normal, LVEF is estimated at 60%.  Final Conclusions: There is complex plaque involving the distal left main, ostial LCx, and ostial LAD. The mid LCx is subtotally occluded. The LCx disease is likely the culprit for NSTEMI. She is no longer having chest pain. EF is preserved.   Patient feels good today. Denies any chest pain or shortness of breath. No nausea or vomiting. Urine output 2.5 L with net negative 1.1L.  Objective: Vital signs in last 24 hours: Filed Vitals:   03/16/11 0300 03/16/11 0400 03/16/11 0500 03/16/11 0600  BP: 110/57 111/55 114/63 120/60  Pulse: 62 63 63 64  Temp:  98.3 F (36.8 C)    TempSrc:  Oral    Resp: 19 18 18 18   Height:      Weight:      SpO2: 95% 95% 95% 96%   Weight change:   Intake/Output Summary (Last 24 hours) at 03/16/11 1191 Last data filed at 03/16/11 0600  Gross per 24 hour  Intake 1381.98 ml  Output   2525 ml  Net -1143.02 ml   Physical Exam: General: NAD, Morbidly obese woman  Lungs: CTA B/L, decrease air entry at bases.  Heart: distant heart sounds, S1, S2, RRR, no murmur,  rubs or gallor. No JVD appreciated, 2+ DP  Abdomen: Soft, obese, BS +, nondistended  Extremities: No pedal edema, no cyanosis or clubbing  Neuro: nonfocal  Lab Results: Basic Metabolic Panel:  Lab 03/16/11 4782 03/15/11 0550  NA 139 136  K 3.7 3.7  CL 103 100  CO2 27 25  GLUCOSE 119* 141*  BUN 15 13  CREATININE 0.75 0.77  CALCIUM 9.6 9.5  MG -- --  PHOS -- --   Liver Function Tests:  Lab 03/14/11 0500 03/12/11 1427  AST 20 32  ALT 20 28  ALKPHOS 57 64  BILITOT 0.5 0.3  PROT 6.2 6.9  ALBUMIN 3.2* 3.6    Lab 03/12/11 1427  LIPASE 33  AMYLASE --   CBC:  Lab 03/16/11 0452 03/15/11 0550  WBC 10.3 11.2*  NEUTROABS -- --  HGB 11.8* 11.8*  HCT 35.9* 36.0  MCV 80.9 80.5  PLT 222 237   Cardiac Enzymes:  Lab 03/13/11 0939 03/12/11 1427 03/12/11 0722  CKTOTAL 194* 290* 112  CKMB 9.5* 21.4* 3.5  CKMBINDEX -- -- --  TROPONINI 2.50* 4.60* <0.30   D-Dimer:  Lab 03/12/11 0722  DDIMER 0.57*   CBG:  Lab 03/15/11 2112 03/15/11 1650 03/15/11 1227 03/15/11 0826 03/14/11 2130 03/14/11 1731  GLUCAP 148* 129* 171* 177* 137* 159*   Hemoglobin A1C:  Lab 03/14/11 0500  HGBA1C 6.6*   Fasting Lipid Panel:  Lab 03/14/11 0500  CHOL 151  HDL 54  LDLCALC 76  TRIG 104  CHOLHDL 2.8  LDLDIRECT --   Thyroid Function Tests:  Lab 03/12/11 1427  TSH 5.151*  T4TOTAL --  FREET4 --  T3FREE --  THYROIDAB --   Coagulation:  Lab 03/16/11 0452 03/15/11 0550 03/14/11 1010 03/14/11 0600  LABPROT 14.7 15.5* 18.2* 18.0*  INR 1.13 1.20 1.48 1.46   Urinalysis:  Lab 03/13/11 2054  COLORURINE YELLOW  LABSPEC <1.005*  PHURINE 5.5  GLUCOSEU NEGATIVE  HGBUR NEGATIVE  BILIRUBINUR NEGATIVE  KETONESUR NEGATIVE  PROTEINUR NEGATIVE  UROBILINOGEN 0.2  NITRITE NEGATIVE  LEUKOCYTESUR NEGATIVE    Micro Results: Recent Results (from the past 240 hour(s))  MRSA PCR SCREENING     Status: Normal   Collection Time   03/12/11  8:58 PM      Component Value Range Status Comment     MRSA by PCR NEGATIVE  NEGATIVE  Final   URINE CULTURE     Status: Normal (Preliminary result)   Collection Time   03/13/11  8:57 PM      Component Value Range Status Comment   Specimen Description URINE, CATHETERIZED   Final    Special Requests NONE   Final    Culture  Setup Time [id number]   Final    Colony Count PENDING   Incomplete    Culture Culture reincubated for better growth   Final    Report Status PENDING   Incomplete    Studies/Results: Dg Chest 2 View  03/14/2011  *RADIOLOGY REPORT*  Clinical Data: Preop for CABG.  Hypertension and diabetes.  Ex- smoker.  COPD.  CHEST - 2 VIEW  Comparison: 03/12/2011 CT and plain film.  Findings: Both views degraded by patient body habitus.  The lateral is also degraded by patient arm position. Midline trachea. Cardiomegaly accentuated by [facility] portable technique.  No pleural effusion or pneumothorax.  Low lung volumes with resultant pulmonary interstitial prominence.  IMPRESSION: Degraded exam, with cardiomegaly and low lung volumes, but no acute disease.  Original Report Authenticated By: Consuello Bossier, M.D.   Medications: Scheduled Meds:   . amLODipine  10 mg Oral Daily  . aspirin EC  81 mg Oral Daily  . carvedilol  6.25 mg Oral BID WC  . Fluticasone-Salmeterol  1 puff Inhalation BID  . insulin aspart  0-5 Units Subcutaneous QHS  . insulin aspart  10 Units Subcutaneous TID WC  . insulin glargine  20 Units Subcutaneous QHS  . levothyroxine  150 mcg Oral BH-q7a  . loratadine  10 mg Oral QPM  . meclizine  25 mg Oral BH-q7a  . olmesartan  40 mg Oral BH-q7a  . pantoprazole  40 mg Oral Daily  . prasugrel  10 mg Oral Daily  . prasugrel  60 mg Oral Once  . rosuvastatin  20 mg Oral QHS  . triamterene-hydrochlorothiazide  1 each Oral BH-q7a  . DISCONTD: aspirin EC  325 mg Oral Daily  . DISCONTD: aspirin EC  325 mg Oral Once  . DISCONTD: aspirin  81 mg Oral Daily   Continuous Infusions:   . sodium chloride Stopped (03/13/11 1800)  .  sodium chloride 10 mL/hr at 03/15/11 1900  . heparin 1,900 Units/hr (03/15/11 2226)  . nitroGLYCERIN 25 mcg/min (03/16/11 0629)   PRN Meds:.acetaminophen, albuterol, ALPRAZolam, alum & mag hydroxide-simeth, HYDROcodone-acetaminophen, ipratropium, magnesium hydroxide, morphine injection, ondansetron (ZOFRAN) IV, ondansetron Assessment/Plan:  1) NSTEMI: positive enzymes- Tr- 4.6-->2.5, but no ST changes. CT Angio Chest  showed severe 3 vessel disease and cardiac cath showed complex plaque involving the distal left main, ostial LCx, and ostial LAD with preserved EF of 60%. [facility]VS evaluated patient - high risk for surgery and postop complications recommended PCI.   Lipid panel showed: LDL- 76, HDL 54 and T.chol 151 trig 104  - Continue Heparin, NTG gtt  - Cont BB, ARB and statin  -Plan for high risk PCI for critical disease of the distal LM bifurcation on Wednesday by Dr. Excell Seltzer -Check P2Y12 in AM  2) HTN (hypertension), benign: BP well controlled on home meds and NTG.   3) Non Insulin dependent Diabetes mellitus: A1c 6.8. Not well controlled, CBGs 130-180's.  - Will Hold metformin, glipizide, and linagliptin while in hospital .  -Continue Lantus 20 units qhs  - meal coverage with Novolog 10 units TID   4) Hypothyroidism (acquired): TSH 5.15, not well-controlled.  -increase synthroid 112 mcg to 150 mcg qAM  -Repeat TSH in 6-8 weeks as outpatient   5) Hyperlipidemia: LDL- 76, HDL 54 and T.chol 151 trig 104  - Continue Crestor 20 mg daily.   6) Recurrent pulmonary emboli: 3 PE previously whenever she was taken off Coumadin. This happens after she had breast tumors removed.  - continue to hold coumadin for now in case of surgery.  - cont Heparin gtt  -Continue Effient, and will check P2Y12 tomorrow  7) Hiatal hernia - stable   8) Obesity, Class III, BMI 40-49.9 (morbid obesity)- need weight loss   LOS: 4 days   HO,MICHELE 03/16/2011, 6:43 AM  Patient examined and agree except changes  made. For high risk PCI on Weds.  Valera Castle, MD 03/16/2011 9:08 AM

## 2011-03-16 NOTE — Progress Notes (Signed)
Chaplain Note:  At request of pt, chaplain provided assistance in executing a living will.  Pt was resting in a bedside recliner, awake, and alert.  Pt's family was at bedside.   After discussing advanced directives with pt, chaplain was satisfied that pt had a clear understanding of the instruments and was capable of making rational decisions for herself.  In the presence of family members, chaplain and pt discussed each item of the living, making the appropriate notations on the form.  Once the form was completed, the chaplain obtained two witnesses and requested the presence of a notary public.  Chaplain was present with pt while she and the witnesses signed the living will.  The notary sealed the document.  The chaplain made four copies of the original document, giving the original and three copies to the pt and giving one copy to the CICU secretary for placement in the pt's chart.  Pt and pt's family expressed appreciation for chaplain support in this matter.  No further needs were expressed.  No follow up required.   03/16/11 1500  Clinical Encounter Type  Visited With Patient and family together  Visit Type Spiritual support;Other (Comment) (Pt requested assistance with Advanced Directives)  Referral From Nurse  Spiritual Encounters  Spiritual Needs Other (Comment) (Not specified)  Stress Factors  Patient Stress Factors Health changes  Family Stress Factors None identified  Advance Directives (For Healthcare)  Advance Directive Patient has advance directive, copy in chart (Completed Living Will this date, 03/16/2011.  Placed in chart)  Type of Advance Directive Living will    Verdie Shire, chaplain resident [phone]

## 2011-03-17 DIAGNOSIS — I214 Non-ST elevation (NSTEMI) myocardial infarction: Secondary | ICD-10-CM

## 2011-03-17 LAB — BASIC METABOLIC PANEL
Calcium: 9.5 mg/dL (ref 8.4–10.5)
Creatinine, Ser: 0.89 mg/dL (ref 0.50–1.10)
GFR calc Af Amer: 79 mL/min — ABNORMAL LOW (ref >90)

## 2011-03-17 LAB — URINE CULTURE: Culture  Setup Time: 201302152105

## 2011-03-17 LAB — CBC
MCHC: 32.2 g/dL (ref 30.0–36.0)
RDW: 14.8 % (ref 11.5–15.5)

## 2011-03-17 LAB — GLUCOSE, CAPILLARY
Glucose-Capillary: 125 mg/dL — ABNORMAL HIGH (ref 70–99)
Glucose-Capillary: 144 mg/dL — ABNORMAL HIGH (ref 70–99)

## 2011-03-17 LAB — PROTIME-INR
INR: 1.11 (ref 0.00–1.49)
Prothrombin Time: 14.5 seconds (ref 11.6–15.2)

## 2011-03-17 LAB — HEPARIN LEVEL (UNFRACTIONATED): Heparin Unfractionated: 0.37 IU/mL (ref 0.30–0.70)

## 2011-03-17 MED ORDER — SODIUM CHLORIDE 0.9 % IJ SOLN
3.0000 mL | Freq: Two times a day (BID) | INTRAMUSCULAR | Status: DC
  Administered 2011-03-17 – 2011-03-18 (×2): 3 mL via INTRAVENOUS

## 2011-03-17 MED ORDER — SODIUM CHLORIDE 0.9 % IV SOLN: 250.0000 mL | INTRAVENOUS | Status: DC | PRN

## 2011-03-17 MED ORDER — SODIUM CHLORIDE 0.9 % IJ SOLN
3.0000 mL | INTRAMUSCULAR | Status: DC | PRN
  Administered 2011-03-18: 3 mL via INTRAVENOUS

## 2011-03-17 MED ORDER — SODIUM CHLORIDE 0.9 % IV SOLN
INTRAVENOUS | Status: DC
  Administered 2011-03-18: 75 mL/h via INTRAVENOUS

## 2011-03-17 NOTE — Progress Notes (Signed)
Chaplain Note:  At pt's request, chaplain made follow up visit.  Pt requested assistance in completing an Health Care Power of Attorney  [facility]).  Pt was resting in a bedside recliner.  Pt was awake, alert, and in good spirits.  Pt's spouse, sister, and mother were present in room.     Chaplain assisted pt by helping her completing her HCPOA, obtaining witnesses and notary, and being present during the signing of the instrument.  In anticipation of a procedure to be done in the morning, pt also asked for prayer.  Chaplain provided spiritual comfort, support, and prayer for pt and pt's family.  Pt and family expressed appreciation for chaplain support.   03/17/11 1500  Clinical Encounter Type  Visited With Patient and family together  Visit Type Follow-up;Spiritual support;Other (Comment) (Assisted pt w/executing HCPOA)  Spiritual Encounters  Spiritual Needs Prayer;Emotional;Other (Comment) (Pt needed assistance with completing HCPOA)  Stress Factors  Patient Stress Factors Health changes;Other (Comment) (Pt facing Cath procedure nxt AM)  Family Stress Factors Major life changes     Verdie Shire, chaplain resident [phone]

## 2011-03-17 NOTE — Progress Notes (Signed)
UR Completed/ UPDATED Simmons, Tregan Read F [phone]  

## 2011-03-17 NOTE — Progress Notes (Signed)
Physical Therapy Treatment Patient Details Name: CHELBY SALATA MRN: [id number] DOB: [birth year] Today's Date: 03/17/2011  PT Assessment/Plan  PT - Assessment/Plan Comments on Treatment Session: Pt admitted with chest pain. Pt was able to walk further today with less assitance. Pt stated she feels that she would be able to walk further if someone followed with a chair. Pt walked without supplemental O2 and her O2 stayed in the mid 90s. Pt still has decreased endurance and PT is recommending HHPT, at least a safety evaluation, upon D/C.  PT Plan: Discharge plan needs to be updated;Frequency remains appropriate PT Frequency: Min 3X/week Follow Up Recommendations: Home health PT Equipment Recommended: None recommended by PT PT Goals  Acute Rehab PT Goals PT Goal: Sit to Stand - Progress: Progressing toward goal PT Goal: Stand to Sit - Progress: Progressing toward goal PT Goal: Ambulate - Progress: Progressing toward goal PT Goal: Perform Home Exercise Program - Progress: Progressing toward goal  PT Treatment Precautions/Restrictions  Precautions Precautions: Fall Required Braces or Orthoses: No Restrictions Weight Bearing Restrictions: No Mobility (including Balance) Bed Mobility Bed Mobility: No Supine to Sit: Not tested (comment) Supine to Sit Details (indicate cue type and reason): needing cueing on progression and assistance getting COM over BOS Sit to Supine: Not Tested (comment) Transfers Transfers: Yes Sit to Stand: 5: Supervision;From bed;With upper extremity assist Sit to Stand Details (indicate cue type and reason): needing cueing for hand placement  Stand to Sit: 5: Supervision;To chair/3-in-1;With armrests Stand to Sit Details: needing cueing on reaching back for chair before sitting  Ambulation/Gait Ambulation/Gait: Yes Ambulation/Gait Assistance: 5: Supervision Ambulation/Gait Assistance Details (indicate cue type and reason): followed with chair  Ambulation  Distance (Feet): 50 Feet Assistive device: None Gait Pattern: Decreased stride length Stairs: No Wheelchair Mobility Wheelchair Mobility: No    Exercise  General Exercises - Lower Extremity Long Arc Quad: AROM;Strengthening;Both;10 reps;Seated Hip Flexion/Marching: AROM;Strengthening;Both;10 reps;Seated Toe Raises: AROM;Strengthening;Both;10 reps;Seated Heel Raises: AROM;Strengthening;Both;10 reps;Seated End of Session PT - End of Session Equipment Utilized During Treatment: Gait belt Activity Tolerance: Patient tolerated treatment well Patient left: in chair;with call bell in reach;with family/visitor present General Behavior During Session: [facility] for tasks performed Cognition: [facility] for tasks performed  Elvera Bicker 03/17/2011, 12:47 PM

## 2011-03-17 NOTE — Progress Notes (Signed)
ANTICOAGULATION CONSULT NOTE - Follow Up Consult  Pharmacy Consult for Heparin Indication: NSTEMI s/p cath  Assessment: 60yof admitted with NSTEMI and hx of recurrent PE (on long-term Coumadin PTA--now on hold), now s/p cath with planned PCI on Wednesday 2/20. Heparin level is  Therapeutic @ 0.37.  H/H and Plts stable. No bleeding or issues noted in the chart.  Heparin dosing weight = 85.4kg.    Goal of Therapy:  Heparin level 0.3-0.7 units/ml   Plan:  1) Continue heparin at current rate:  2000 units/hr.   2) F/u daily heparin level and CBC in AM  Haynes Hoehn, PharmD 03/17/2011 8:04 AM  Pager: [phone]   Labs:  Basename 03/17/11 0630 03/16/11 0452 03/15/11 0550  HGB 11.6* 11.8* --  HCT 36.0 35.9* 36.0  PLT 250 222 237  APTT -- -- --  LABPROT 14.5 14.7 15.5*  INR 1.11 1.13 1.20  HEPARINUNFRC 0.37 0.30 0.30  CREATININE 0.89 0.75 0.77  CKTOTAL -- -- --  CKMB -- -- --  TROPONINI -- -- --   Estimated Creatinine Clearance: 89.6 ml/min (by C-G formula based on Cr of 0.89).

## 2011-03-17 NOTE — Progress Notes (Signed)
Tamyra Fojtik Ingold,PT Acute Rehabilitation [phone] (pager)  

## 2011-03-17 NOTE — Progress Notes (Signed)
Subjective: HPI: [name] is a 60 yo woman with PMH of T2DM, HTN, 3 pulmonary emboli whenever she was taken off Coumadin, so she is now on chronic warfarin therapy, HLD, COPD, Hypothyroidism who comes in with substernal chest pain, some neck fullness/sensation and lateral chest/shoulder pain with Elevated troponin, NSTEMI.  Cardiac cath on 2/15 by Dr. McLean:  Left mainstem: 70-80% distal LM stenosis.  Left anterior descending (LAD): 80% ostial LAD stenosis. Luminal irregularities in the remainder of the LAD.  Left circumflex (LCx): There was a small ramus. 95% ostial LCx stenosis. The AV LCx was subtotally occluded after the moderate 1st obtuse marginal.  Right coronary artery (RCA): Diffuse disease in the mid RCA up to about 50% at the takeoff of the 2nd acute marginal. 50% proximal PDA stenosis.  Left ventriculography: Left ventricular systolic function is normal, LVEF is estimated at 60%.  Final Conclusions: There is complex plaque involving the distal left main, ostial LCx, and ostial LAD. The mid LCx is subtotally occluded. The LCx disease is likely the culprit for NSTEMI. She is no longer having chest pain. EF is preserved.   She is doing well this morning, denies any chest pain or SOB.   Objective: Vital signs in last 24 hours: Filed Vitals:   03/17/11 0300 03/17/11 0400 03/17/11 0500 03/17/11 0600  BP: 103/50 93/45 113/45 112/55  Pulse: 59 58 60 62  Temp:  97.7 F (36.5 C)    TempSrc:  Oral    Resp: 17 18 17 19  Height:      Weight:      SpO2: 98% 95% 98% 98%   Weight change:   Intake/Output Summary (Last 24 hours) at 03/17/11 0630 Last data filed at 03/17/11 0600  Gross per 24 hour  Intake 1883.88 ml  Output    900 ml  Net 983.88 ml   Physical Exam: General: NAD, Morbidly obese woman  Lungs: CTA B/L, decrease air entry at bases.  Heart: distant heart sounds, S1, S2, RRR, no murmur, rubs or gallor. No JVD appreciated, 2+ DP  Abdomen: Soft, obese, BS +,  nondistended  Extremities: No pedal edema, no cyanosis or clubbing  Neuro: nonfocal  Lab Results: Basic Metabolic Panel:  Lab 03/16/11 0452 03/15/11 0550  NA 139 136  K 3.7 3.7  CL 103 100  CO2 27 25  GLUCOSE 119* 141*  BUN 15 13  CREATININE 0.75 0.77  CALCIUM 9.6 9.5  MG -- --  PHOS -- --   Liver Function Tests:  Lab 03/14/11 0500 03/12/11 1427  AST 20 32  ALT 20 28  ALKPHOS 57 64  BILITOT 0.5 0.3  PROT 6.2 6.9  ALBUMIN 3.2* 3.6    Lab 03/12/11 1427  LIPASE 33  AMYLASE --   CBC:  Lab 03/16/11 0452 03/15/11 0550  WBC 10.3 11.2*  NEUTROABS -- --  HGB 11.8* 11.8*  HCT 35.9* 36.0  MCV 80.9 80.5  PLT 222 237   Cardiac Enzymes:  Lab 03/13/11 0939 03/12/11 1427 03/12/11 0722  CKTOTAL 194* 290* 112  CKMB 9.5* 21.4* 3.5  CKMBINDEX -- -- --  TROPONINI 2.50* 4.60* <0.30   D-Dimer:  Lab 03/12/11 0722  DDIMER 0.57*   CBG:  Lab 03/16/11 2153 03/16/11 1726 03/16/11 1210 03/16/11 0757 03/15/11 2112 03/15/11 1650  GLUCAP 114* 106* 198* 139* 148* 129*   Hemoglobin A1C:  Lab 03/14/11 0500  HGBA1C 6.6*   Fasting Lipid Panel:  Lab 03/14/11 0500  CHOL 151  HDL 54    LDLCALC 76  TRIG 104  CHOLHDL 2.8  LDLDIRECT --   Thyroid Function Tests:  Lab 03/12/11 1427  TSH 5.151*  T4TOTAL --  FREET4 --  T3FREE --  THYROIDAB --   Coagulation:  Lab 03/16/11 0452 03/15/11 0550 03/14/11 1010 03/14/11 0600  LABPROT 14.7 15.5* 18.2* 18.0*  INR 1.13 1.20 1.48 1.46   Urinalysis:  Lab 03/13/11 2054  COLORURINE YELLOW  LABSPEC <1.005*  PHURINE 5.5  GLUCOSEU NEGATIVE  HGBUR NEGATIVE  BILIRUBINUR NEGATIVE  KETONESUR NEGATIVE  PROTEINUR NEGATIVE  UROBILINOGEN 0.2  NITRITE NEGATIVE  LEUKOCYTESUR NEGATIVE     Micro Results: Recent Results (from the past 240 hour(s))  MRSA PCR SCREENING     Status: Normal   Collection Time   03/12/11  8:58 PM      Component Value Range Status Comment   MRSA by PCR NEGATIVE  NEGATIVE  Final   URINE CULTURE     Status:  Normal   Collection Time   03/13/11  8:57 PM      Component Value Range Status Comment   Specimen Description URINE, CATHETERIZED   Final    Special Requests NONE   Final    Culture  Setup Time [id number]   Final    Colony Count 20,OOO COLONIES/ML   Final    Culture     Final    Value: ESCHERICHIA COLI     GROUP B STREP(S.AGALACTIAE)ISOLATED     Note: TESTING AGAINST S. AGALACTIAE NOT ROUTINELY PERFORMED DUE TO PREDICTABILITY OF AMP/PEN/VAN SUSCEPTIBILITY.   Report Status 03/17/2011 FINAL   Final    Organism ID, Bacteria ESCHERICHIA COLI   Final   Medications:  Scheduled Meds:   . amLODipine  10 mg Oral Daily  . aspirin EC  81 mg Oral Daily  . carvedilol  6.25 mg Oral BID WC  . Fluticasone-Salmeterol  1 puff Inhalation BID  . insulin aspart  0-5 Units Subcutaneous QHS  . insulin aspart  10 Units Subcutaneous TID WC  . insulin glargine  20 Units Subcutaneous QHS  . levothyroxine  150 mcg Oral BH-q7a  . loratadine  10 mg Oral QPM  . meclizine  25 mg Oral BH-q7a  . olmesartan  40 mg Oral BH-q7a  . pantoprazole  40 mg Oral Daily  . prasugrel  10 mg Oral Daily  . rosuvastatin  20 mg Oral QHS  . triamterene-hydrochlorothiazide  1 each Oral BH-q7a   Continuous Infusions:   . sodium chloride Stopped (03/13/11 1800)  . sodium chloride 10 mL/hr at 03/15/11 1900  . heparin 2,000 Units/hr (03/17/11 0149)  . nitroGLYCERIN 20 mcg/min (03/16/11 2000)   PRN Meds:.acetaminophen, albuterol, ALPRAZolam, alum & mag hydroxide-simeth, HYDROcodone-acetaminophen, ipratropium, loperamide, magnesium hydroxide, morphine injection, ondansetron (ZOFRAN) IV, ondansetron Assessment/Plan: 1) NSTEMI: positive enzymes- Tr- 4.6-->2.5, but no ST changes. CT Angio Chest showed severe 3 vessel disease and cardiac cath showed complex plaque involving the distal left main, ostial LCx, and ostial LAD with preserved EF of 60%. CTVS evaluated patient - high risk for surgery and postop complications recommended PCI.    Lipid panel showed: LDL- 76, HDL 54 and T.chol 151 trig 104  - Continue Heparin, NTG gtt  - Cont BB, ARB and statin  -Plan for high risk PCI for critical disease of the distal LM bifurcation on Wednesday by Dr. Cooper  -Check P2Y12 today. Agree.  2) HTN (hypertension), benign: BP well controlled on home meds and NTG.   3) Non Insulin dependent Diabetes mellitus: A1c 6.8.    Improving CBGs 106-198 - Will Hold metformin, glipizide, and linagliptin while in hospital .  -Continue Lantus 20 units qhs  - meal coverage with Novolog 10 units TID   4) Hypothyroidism (acquired): TSH 5.15, not well-controlled.  -Continue Synthroid 150 mcg qAM  -Repeat TSH in 6-8 weeks as outpatient   5) Hyperlipidemia: LDL- 76, HDL 54 and T.chol 151 trig 104  - Continue Crestor 20 mg daily.   6) Recurrent pulmonary emboli: 3 PE previously whenever she was taken off Coumadin. This happens after she had breast tumors removed.  - continue to hold coumadin for PCI in AM  - cont Heparin gtt  -Continue Effient, and will check P2Y12 this AM- results pending  7) Hiatal hernia - stable  8) Obesity, Class III, BMI 40-49.9 (morbid obesity)- need weight loss   LOS: 5 days   HO,MICHELE 03/17/2011, 6:30 AM  Cardiology Attending  Patient seen and examined. I have reviewed the findings of Dr. Ho and these concur with my findings on the history, physical exam, assessment and plan. The patient is scheduled to undergo PCI tomorrow.  Toshiro Hanken, M.D.  

## 2011-03-18 ENCOUNTER — Encounter (HOSPITAL_COMMUNITY)
Admission: EM | Disposition: A | Discharge status: Home Health | Payer: Self-pay | Source: Ambulatory Visit | Attending: Cardiology

## 2011-03-18 ENCOUNTER — Encounter (HOSPITAL_COMMUNITY): Payer: Self-pay | Admitting: Anesthesiology

## 2011-03-18 ENCOUNTER — Inpatient Hospital Stay (HOSPITAL_COMMUNITY): Payer: 59 | Admitting: Anesthesiology

## 2011-03-18 ENCOUNTER — Other Ambulatory Visit: Payer: Self-pay

## 2011-03-18 DIAGNOSIS — I214 Non-ST elevation (NSTEMI) myocardial infarction: Secondary | ICD-10-CM

## 2011-03-18 DIAGNOSIS — I2510 Atherosclerotic heart disease of native coronary artery without angina pectoris: Secondary | ICD-10-CM

## 2011-03-18 HISTORY — PX: PERCUTANEOUS CORONARY STENT INTERVENTION (PCI-S): SHX5485

## 2011-03-18 LAB — HEPARIN LEVEL (UNFRACTIONATED): Heparin Unfractionated: 0.33 [IU]/mL (ref 0.30–0.70)

## 2011-03-18 LAB — GLUCOSE, CAPILLARY
Glucose-Capillary: 141 mg/dL — ABNORMAL HIGH (ref 70–99)
Glucose-Capillary: 124 mg/dL — ABNORMAL HIGH (ref 70–99)
Glucose-Capillary: 108 mg/dL — ABNORMAL HIGH (ref 70–99)

## 2011-03-18 LAB — CBC
HCT: 35.5 % — ABNORMAL LOW (ref 36.0–46.0)
Hemoglobin: 11.7 g/dL — ABNORMAL LOW (ref 12.0–15.0)
MCH: 26.4 pg (ref 26.0–34.0)
MCHC: 33 g/dL (ref 30.0–36.0)
MCV: 80.1 fL (ref 78.0–100.0)
Platelets: 256 K/uL (ref 150–400)
RBC: 4.43 MIL/uL (ref 3.87–5.11)
RDW: 14.7 % (ref 11.5–15.5)
WBC: 10.2 K/uL (ref 4.0–10.5)

## 2011-03-18 LAB — BASIC METABOLIC PANEL
BUN: 19 mg/dL (ref 6–23)
Chloride: 103 mEq/L (ref 96–112)
Creatinine, Ser: 0.86 mg/dL (ref 0.50–1.10)
GFR calc Af Amer: 82 mL/min — ABNORMAL LOW (ref >90)
Glucose, Bld: 120 mg/dL — ABNORMAL HIGH (ref 70–99)

## 2011-03-18 LAB — CARDIAC PANEL(CRET KIN+CKTOT+MB+TROPI)
CK, MB: 1.7 ng/mL (ref 0.3–4.0)
Total CK: 51 U/L (ref 7–177)
Troponin I: <0.3 ng/mL (ref <0.30)

## 2011-03-18 SURGERY — PERCUTANEOUS CORONARY STENT INTERVENTION (PCI-S)
Anesthesia: General

## 2011-03-18 MED ORDER — SODIUM CHLORIDE 0.9 % IV SOLN
INTRAVENOUS | Status: AC
  Administered 2011-03-18: 18:00:00 via INTRAVENOUS

## 2011-03-18 MED ORDER — PROPOFOL 10 MG/ML IV BOLUS
INTRAVENOUS | Status: DC | PRN
  Administered 2011-03-18: 150 mg via INTRAVENOUS

## 2011-03-18 MED ORDER — NITROGLYCERIN IN D5W 200-5 MCG/ML-% IV SOLN
INTRAVENOUS | Status: AC
  Administered 2011-03-18: 20 ug/min
  Filled 2011-03-18: qty 250

## 2011-03-18 MED ORDER — ENOXAPARIN SODIUM 40 MG/0.4ML ~~LOC~~ SOLN
40.0000 mg | SUBCUTANEOUS | Status: DC
  Administered 2011-03-19 – 2011-03-20 (×2): 40 mg via SUBCUTANEOUS
  Filled 2011-03-18 (×2): qty 0.4

## 2011-03-18 MED ORDER — ONDANSETRON HCL 4 MG/2ML IJ SOLN: 4.0000 mg | Freq: Four times a day (QID) | INTRAMUSCULAR | Status: DC | PRN

## 2011-03-18 MED ORDER — NITROGLYCERIN 0.2 MG/ML ON CALL CATH LAB
INTRAVENOUS | Status: AC
  Filled 2011-03-18: qty 1

## 2011-03-18 MED ORDER — ENOXAPARIN SODIUM 40 MG/0.4ML ~~LOC~~ SOLN: 40.0000 mg | SUBCUTANEOUS | Status: DC

## 2011-03-18 MED ORDER — BIVALIRUDIN 250 MG IV SOLR
INTRAVENOUS | Status: AC
  Filled 2011-03-18: qty 250

## 2011-03-18 MED ORDER — LIDOCAINE HCL (PF) 1 % IJ SOLN
INTRAMUSCULAR | Status: AC
  Filled 2011-03-18: qty 30

## 2011-03-18 MED ORDER — ROCURONIUM BROMIDE 100 MG/10ML IV SOLN
INTRAVENOUS | Status: DC | PRN
  Administered 2011-03-18: 50 mg via INTRAVENOUS

## 2011-03-18 MED ORDER — EPHEDRINE SULFATE 50 MG/ML IJ SOLN
INTRAMUSCULAR | Status: DC | PRN
  Administered 2011-03-18 (×4): 10 mg via INTRAVENOUS

## 2011-03-18 MED ORDER — ONDANSETRON HCL 4 MG/2ML IJ SOLN
INTRAMUSCULAR | Status: DC | PRN
  Administered 2011-03-18: 4 mg via INTRAVENOUS

## 2011-03-18 MED ORDER — VERAPAMIL HCL 2.5 MG/ML IV SOLN
INTRAVENOUS | Status: AC
  Filled 2011-03-18: qty 2

## 2011-03-18 MED ORDER — NEOSTIGMINE METHYLSULFATE 1 MG/ML IJ SOLN
INTRAMUSCULAR | Status: DC | PRN
  Administered 2011-03-18: 3 mg via INTRAVENOUS

## 2011-03-18 MED ORDER — GLYCOPYRROLATE 0.2 MG/ML IJ SOLN
INTRAMUSCULAR | Status: DC | PRN
  Administered 2011-03-18: .4 mg via INTRAVENOUS

## 2011-03-18 MED ORDER — MIDAZOLAM HCL 5 MG/5ML IJ SOLN
INTRAMUSCULAR | Status: DC | PRN
  Administered 2011-03-18: 2 mg via INTRAVENOUS

## 2011-03-18 MED ORDER — HEPARIN (PORCINE) IN NACL 2-0.9 UNIT/ML-% IJ SOLN
INTRAMUSCULAR | Status: AC
  Filled 2011-03-18: qty 2000

## 2011-03-18 MED ORDER — ACETAMINOPHEN 325 MG PO TABS
650.0000 mg | ORAL_TABLET | ORAL | Status: DC | PRN
  Administered 2011-03-18 (×2): 650 mg via ORAL
  Filled 2011-03-18 (×2): qty 2

## 2011-03-18 MED ORDER — FENTANYL CITRATE 0.05 MG/ML IJ SOLN
INTRAMUSCULAR | Status: DC | PRN
  Administered 2011-03-18: 50 ug via INTRAVENOUS
  Administered 2011-03-18: 100 ug via INTRAVENOUS

## 2011-03-18 NOTE — Anesthesia Procedure Notes (Signed)
Date/Time: 03/18/2011 10:08 AM Performed by: Isidore Moos A Oxygen Delivery Method: Circle system utilized Preoxygenation: Pre-oxygenation with 100% oxygen Intubation Type: IV induction and Cricoid Pressure applied Ventilation: Mask ventilation without difficulty Laryngoscope Size: Mac and 3 Grade View: Grade II Tube type: Oral Number of attempts: 1 Placement Confirmation: ETT inserted through vocal cords under direct vision,  breath sounds checked- equal and bilateral and positive ETCO2 Secured at: 22 cm Tube secured with: Tape Dental Injury: Teeth and Oropharynx as per pre-operative assessment

## 2011-03-18 NOTE — Addendum Note (Signed)
Addendum  created 03/18/11 1500 by Adair Laundry, CRNA   Modules edited:Anesthesia Events

## 2011-03-18 NOTE — Addendum Note (Signed)
Addendum  created 03/18/11 1500 by Makeya Hilgert A Betta Balla, CRNA   Modules edited:Anesthesia Events    

## 2011-03-18 NOTE — Anesthesia Postprocedure Evaluation (Signed)
  Anesthesia Post-op Note  Patient: [name]  Procedure(s) Performed: Procedure(s) (LRB): PERCUTANEOUS CORONARY STENT INTERVENTION (PCI-S) (N/A)  Patient Location: PACU and Cath Lab  Anesthesia Type: General  Level of Consciousness: awake, alert  and oriented  Airway and Oxygen Therapy: Patient Spontanous Breathing and Patient connected to nasal cannula oxygen  Post-op Pain: mild  Post-op Assessment: Post-op Vital signs reviewed, Patient's Cardiovascular Status Stable, Respiratory Function Stable, Patent Airway, No signs of Nausea or vomiting and Pain level controlled  Post-op Vital Signs: Reviewed and stable  Complications: No apparent anesthesia complications

## 2011-03-18 NOTE — Progress Notes (Signed)
Post-PCI: pt stable. She has soreness in the right arm, otherwise no complaints. Denies chest pain or pressure. Small right arm hematoma stable. Rad-Stat device loosened. Cardiac markers will be cycled.

## 2011-03-18 NOTE — Anesthesia Preprocedure Evaluation (Addendum)
Anesthesia Evaluation  Patient identified by MRN, date of birth, ID band Patient awake    Reviewed: Allergy & Precautions, H&P , NPO status , Patient's Chart, lab work & pertinent test results  Airway Mallampati: II TM Distance: >3 FB Neck ROM: Full    Dental  (+) Teeth Intact and Dental Advisory Given   Pulmonary COPD COPD inhaler, former smoker clear to auscultation        Cardiovascular hypertension, Pt. on medications + CAD and + Past MI Regular Normal    Neuro/Psych    GI/Hepatic hiatal hernia,   Endo/Other  Diabetes mellitus-, Well Controlled, Type 2, Oral Hypoglycemic AgentsHypothyroidism   Renal/GU      Musculoskeletal   Abdominal   Peds  Hematology   Anesthesia Other Findings   Reproductive/Obstetrics                          Anesthesia Physical Anesthesia Plan  ASA: IV  Anesthesia Plan: General   Post-op Pain Management:    Induction: Intravenous  Airway Management Planned: Oral ETT  Additional Equipment:   Intra-op Plan:   Post-operative Plan: Extubation in OR  Informed Consent: I have reviewed the patients History and Physical, chart, labs and discussed the procedure including the risks, benefits and alternatives for the proposed anesthesia with the patient or authorized representative who has indicated his/her understanding and acceptance.   Dental advisory given  Plan Discussed with: CRNA, Anesthesiologist and Surgeon  Anesthesia Plan Comments:         Anesthesia Quick Evaluation

## 2011-03-18 NOTE — Interval H&P Note (Signed)
History and Physical Interval Note:  03/18/2011 10:31 AM  [name]  has presented today for surgery, with the diagnosis of blockage  The various methods of treatment have been discussed with the patient and family. After consideration of risks, benefits and other options for treatment, the patient has consented to  Procedure(s) (LRB): PERCUTANEOUS CORONARY STENT INTERVENTION (PCI-S) (N/A) as a surgical intervention .  The patients' history has been reviewed, patient examined, no change in status, stable for surgery.  I have reviewed the patients' chart and labs.  Questions were answered to the patient's satisfaction.  See today's progress note.   Tonny Bollman

## 2011-03-18 NOTE — Progress Notes (Signed)
Subjective: HPI: [name] is a 61 yo woman with PMH of T2DM, HTN, 3 pulmonary emboli whenever she was taken off Coumadin, so she is now on chronic warfarin therapy, HLD, COPD, Hypothyroidism who comes in with substernal chest pain, some neck fullness/sensation and lateral chest/shoulder pain with Elevated troponin, NSTEMI.  Cardiac cath on 2/15 by Dr. Shirlee Latch:  Left mainstem: 70-80% distal LM stenosis.  Left anterior descending (LAD): 80% ostial LAD stenosis. Luminal irregularities in the remainder of the LAD.  Left circumflex (LCx): There was a small ramus. 95% ostial LCx stenosis. The AV LCx was subtotally occluded after the moderate 1st obtuse marginal.  Right coronary artery (RCA): Diffuse disease in the mid RCA up to about 50% at the takeoff of the 2nd acute marginal. 50% proximal PDA stenosis.  Left ventriculography: Left ventricular systolic function is normal, LVEF is estimated at 60%.  Final Conclusions: There is complex plaque involving the distal left main, ostial LCx, and ostial LAD. The mid LCx is subtotally occluded. The LCx disease is likely the culprit for NSTEMI. She is no longer having chest pain. EF is preserved.   She is doing well this morning, denies any chest pain or SOB. Her P2Y12 is 210.  Awaiting for high risk PCI by Dr. Excell Seltzer today.  Objective: Vital signs in last 24 hours: Filed Vitals:   03/18/11 0200 03/18/11 0300 03/18/11 0400 03/18/11 0500  BP: 126/61 82/38 117/54   Pulse: 59 60 57   Temp:   97.5 F (36.4 C)   TempSrc:   Oral   Resp: 21 20 18    Height:      Weight:    319 lb 10.7 oz (145 kg)  SpO2: 92% 93% 96%    Weight change:   Intake/Output Summary (Last 24 hours) at 03/18/11 8295 Last data filed at 03/18/11 0500  Gross per 24 hour  Intake   1876 ml  Output   1275 ml  Net    601 ml   Physical Exam: General: NAD, Morbidly obese woman  Lungs: CTA B/L, decrease air entry at bases.  Heart: distant heart sounds, S1, S2, RRR, no murmur, rubs  or gallor. No JVD appreciated, 2+ DP  Abdomen: Soft, obese, BS +, nondistended  Extremities: No pedal edema, no cyanosis or clubbing  Neuro: nonfocal  Lab Results: Basic Metabolic Panel:  Lab 03/18/11 6213 03/17/11 0630  NA 137 139  K 4.1 3.7  CL 103 102  CO2 25 27  GLUCOSE 120* 123*  BUN 19 19  CREATININE 0.86 0.89  CALCIUM 9.7 9.5  MG -- --  PHOS -- --   Liver Function Tests:  Lab 03/14/11 0500 03/12/11 1427  AST 20 32  ALT 20 28  ALKPHOS 57 64  BILITOT 0.5 0.3  PROT 6.2 6.9  ALBUMIN 3.2* 3.6    Lab 03/12/11 1427  LIPASE 33  AMYLASE --   CBC:  Lab 03/18/11 0455 03/17/11 0630  WBC 10.2 9.4  NEUTROABS -- --  HGB 11.7* 11.6*  HCT 35.5* 36.0  MCV 80.1 81.6  PLT 256 250   Cardiac Enzymes:  Lab 03/13/11 0939 03/12/11 1427 03/12/11 0722  CKTOTAL 194* 290* 112  CKMB 9.5* 21.4* 3.5  CKMBINDEX -- -- --  TROPONINI 2.50* 4.60* <0.30   D-Dimer:  Lab 03/12/11 0722  DDIMER 0.57*   CBG:  Lab 03/17/11 2117 03/17/11 1710 03/17/11 1231 03/17/11 0743 03/16/11 2153 03/16/11 1726  GLUCAP 149* 144* 139* 125* 114* 106*   Hemoglobin A1C:  Lab 03/14/11 0500  HGBA1C 6.6*   Fasting Lipid Panel:  Lab 03/14/11 0500  CHOL 151  HDL 54  LDLCALC 76  TRIG 104  CHOLHDL 2.8  LDLDIRECT --   Thyroid Function Tests:  Lab 03/12/11 1427  TSH 5.151*  T4TOTAL --  FREET4 --  T3FREE --  THYROIDAB --   Coagulation:  Lab 03/17/11 0630 03/16/11 0452 03/15/11 0550 03/14/11 1010  LABPROT 14.5 14.7 15.5* 18.2*  INR 1.11 1.13 1.20 1.48   Urinalysis:  Lab 03/13/11 2054  COLORURINE YELLOW  LABSPEC <1.005*  PHURINE 5.5  GLUCOSEU NEGATIVE  HGBUR NEGATIVE  BILIRUBINUR NEGATIVE  KETONESUR NEGATIVE  PROTEINUR NEGATIVE  UROBILINOGEN 0.2  NITRITE NEGATIVE  LEUKOCYTESUR NEGATIVE   *  Micro Results: Recent Results (from the past 240 hour(s))  MRSA PCR SCREENING     Status: Normal   Collection Time   03/12/11  8:58 PM      Component Value Range Status Comment    MRSA by PCR NEGATIVE  NEGATIVE  Final   URINE CULTURE     Status: Normal   Collection Time   03/13/11  8:57 PM      Component Value Range Status Comment   Specimen Description URINE, CATHETERIZED   Final    Special Requests NONE   Final    Culture  Setup Time [id number]   Final    Colony Count 20,OOO COLONIES/ML   Final    Culture     Final    Value: ESCHERICHIA COLI     GROUP B STREP(S.AGALACTIAE)ISOLATED     Note: TESTING AGAINST S. AGALACTIAE NOT ROUTINELY PERFORMED DUE TO PREDICTABILITY OF AMP/PEN/VAN SUSCEPTIBILITY.   Report Status 03/17/2011 FINAL   Final    Organism ID, Bacteria ESCHERICHIA COLI   Final    Medications: Scheduled Meds:   . amLODipine  10 mg Oral Daily  . aspirin EC  81 mg Oral Daily  . carvedilol  6.25 mg Oral BID WC  . Fluticasone-Salmeterol  1 puff Inhalation BID  . insulin aspart  0-5 Units Subcutaneous QHS  . insulin aspart  10 Units Subcutaneous TID WC  . insulin glargine  20 Units Subcutaneous QHS  . levothyroxine  150 mcg Oral BH-q7a  . loratadine  10 mg Oral QPM  . meclizine  25 mg Oral BH-q7a  . nitroGLYCERIN      . olmesartan  40 mg Oral BH-q7a  . pantoprazole  40 mg Oral Daily  . prasugrel  10 mg Oral Daily  . rosuvastatin  20 mg Oral QHS  . sodium chloride  3 mL Intravenous Q12H  . triamterene-hydrochlorothiazide  1 each Oral BH-q7a   Continuous Infusions:   . sodium chloride Stopped (03/13/11 1800)  . sodium chloride 10 mL/hr at 03/15/11 1900  . sodium chloride 75 mL/hr (03/18/11 0400)  . heparin 2,000 Units/hr (03/18/11 0510)  . nitroGLYCERIN 20 mcg/min (03/18/11 0500)   PRN Meds:.sodium chloride, acetaminophen, albuterol, ALPRAZolam, alum & mag hydroxide-simeth, HYDROcodone-acetaminophen, ipratropium, loperamide, magnesium hydroxide, morphine injection, ondansetron (ZOFRAN) IV, ondansetron, sodium chloride Assessment/Plan: 1) NSTEMI: positive enzymes- Tr- 4.6-->2.5, but no ST changes. CT Angio Chest showed severe 3 vessel disease  and cardiac cath showed complex plaque involving the distal left main, ostial LCx, and ostial LAD with preserved EF of 60%. CTVS evaluated patient - high risk for surgery and postop complications recommended PCI.  Lipid panel showed: LDL- 76, HDL 54 and T.chol 151 trig 104  - Continue Heparin, NTG gtt , Effient - Cont BB,  ARB and statin  -P2Y12: 210 -Plan for high risk PCI for critical disease of the distal LM bifurcation today by Dr. Excell Seltzer   2) HTN (hypertension), benign: BP well controlled on home meds and NTG.    3) Non Insulin dependent Diabetes mellitus: A1c 6.8. Well-controlled with insulin CBGs 100-150's  - Will Hold metformin, glipizide, and linagliptin while in hospital .  -Continue Lantus 20 units qhs  - meal coverage with Novolog 10 units TID   4) Hypothyroidism (acquired): TSH 5.15, not well-controlled.  -Continue Synthroid 150 mcg qAM  -Repeat TSH in 6-8 weeks as outpatient   5) Hyperlipidemia: LDL- 76, HDL 54 and T.chol 151 trig 104  - Continue Crestor 20 mg daily.   6) Recurrent pulmonary emboli: 3 PE previously whenever she was taken off Coumadin. This happens after she had breast tumors removed.  - continue to hold coumadin for PCI in AM  - cont Heparin gtt  -Continue Effient  7) Hiatal hernia - stable   8) Obesity, Class III, BMI 40-49.9 (morbid obesity)- need weight loss      LOS: 6 days   HO,MICHELE 03/18/2011, 6:28 AM  Patient seen, examined. Available data reviewed. Agree with findings, assessment, and plan as outlined by Dr Anselm Jungling. I have reviewed risks, indications, alternatives to high-risk PCI of the left main/LAD/LCx with the patient and her family in detail and they understand and agree to proceed. Procedure to be done under general anesthesia because of high-complexity and patient's inability to lie flat. She has been loaded with prasugrel. After careful review, I have decided to do the procedure without hemodynamic support because of her marked obesity  and inherent risk of groin access (major uncontrolled bleeding).  Tonny Bollman, M.D. 03/18/2011 10:32 AM

## 2011-03-18 NOTE — Op Note (Signed)
CARDIAC CATH NOTE  Name: [name] MRN: [id number] DOB: 1950-02-24  Procedure: PTCA and stenting of the distal left main  Indication: Non-ST elevation MI  Procedural Details: The right wrist was prepped, draped, and anesthetized with 1% lidocaine. Using the modified Seldinger technique, a 6 Fr sheath was introduced into the radial artery. Radial artery access was difficult. I ultimately used a Doppler probe to direct the needle into the right radial artery. 3 mg verapamil was administered through the radial sheath. Weight-based bivalirudin was given for anticoagulation.   Once a therapeutic ACT was achieved, a 6 [place] XB LAD 3.5 cm guide catheter was inserted.  This demonstrated severe distal left main stenosis extending into the LAD. The ostial left circumflex was subtotally occluded as had been previously demonstrated. A cougar guidewire was advanced into the LAD without difficulty. I performed intravascular ultrasound of the LAD with a mechanical pullback into the left mainstem. This demonstrated severe stenosis at the junction of the distal left main and ostial LAD with 360 of calcification. At that point, I attempted to wire the left circumflex. Initially I tried a Chief Technology Officer. After a prolonged attempt with multiple angulations on the wire I was unable to advance the whisper wire across the total occlusion. I was able to pass the wire into the proximal circumflex but would not pass through the distal cap of the occlusion. I then attempted a fielder wire without success. A PT to moderate support wire was then attempted, also without success. With this wire I was able to advance a little further into the circumflex but still could not pass through the distal cap of the occlusion. Finally, I attempted a [employer] 3 g wire with the same result. I was able to direct this wire into the circumflex but because of severe angulation and critical stenosis I was unable to cross the  occlusion. At that point, I felt the only option was to dilate the LAD/left main junction. This area was dilated with a 3.5 x 12 mm [employer] Quantum Apex balloon which was taken to a maximum pressure of 12 atmospheres. Balloon dilatation showed good expansion of the vessel, but it did not significantly change the anatomy of the distal left main bifurcation. Reattempt at wiring the left circumflex were made but they were all unsuccessful. After prolonged attempt, I thought it was best to treat the left main into the LAD. A 4.0 x 16 mm Promus element drug-eluting stent was implanted and deployed at 12 atmospheres. It appeared well-expanded angiographically. The stent was postdilated with a 4.5 x 12 mm [place] Quantum Apex balloon which was taken to 16 atmospheres on 2 inflations. Intravascular ultrasound was again performed and it demonstrated a good result. There was some underexpansion of the stent in the area of most severe stenosis but I did not feel that this can be improved upon. The circumflex was flush occluded at the ostium but I felt like further attempts at opening the circumflex would also be unsuccessful. The guide catheter and wires were removed. A JR 4 catheter was advanced to see if the circumflex with filling from right to left collaterals. I could not engage the right coronary artery. A no torque right catheter was then used and I did not appreciate any collaterals from the right coronary artery. However, I was unable to fill the RCA very well. The patient remained hemodynamically stable throughout. The entire procedure was done under general anesthesia because of her super morbid obesity and inability  to life flat for a prolonged procedure. She did not require any hemodynamic support through mechanical devices or the inotropes. She had no ST segment changes throughout the procedure. She was assessed at the completion of the procedure after being extubated and she denied any chest pain.  Lesion Data: Vessel:  Left Main-distal Percent stenosis (pre): 80 TIMI-flow (pre):  3 Stent:  4.0 x 16 mm Promus element Percent stenosis (post):10 TIMI-flow (post): 3  Conclusions: Successful stenting of the distal left mainstem and of the LAD. Occlusion of the left circumflex at the procedure completion.  Recommendations: Lifelong dual antiplatelet therapy with aspirin and Effient  Tonny Bollman 03/18/2011, 12:41 PM

## 2011-03-18 NOTE — H&P (View-Only) (Signed)
Subjective: HPI: [name] is a 61 yo woman with PMH of T2DM, HTN, 3 pulmonary emboli whenever she was taken off Coumadin, so she is now on chronic warfarin therapy, HLD, COPD, Hypothyroidism who comes in with substernal chest pain, some neck fullness/sensation and lateral chest/shoulder pain with Elevated troponin, NSTEMI.  Cardiac cath on 2/15 by Dr. Shirlee Latch:  Left mainstem: 70-80% distal LM stenosis.  Left anterior descending (LAD): 80% ostial LAD stenosis. Luminal irregularities in the remainder of the LAD.  Left circumflex (LCx): There was a small ramus. 95% ostial LCx stenosis. The AV LCx was subtotally occluded after the moderate 1st obtuse marginal.  Right coronary artery (RCA): Diffuse disease in the mid RCA up to about 50% at the takeoff of the 2nd acute marginal. 50% proximal PDA stenosis.  Left ventriculography: Left ventricular systolic function is normal, LVEF is estimated at 60%.  Final Conclusions: There is complex plaque involving the distal left main, ostial LCx, and ostial LAD. The mid LCx is subtotally occluded. The LCx disease is likely the culprit for NSTEMI. She is no longer having chest pain. EF is preserved.   She is doing well this morning, denies any chest pain or SOB.   Objective: Vital signs in last 24 hours: Filed Vitals:   03/17/11 0300 03/17/11 0400 03/17/11 0500 03/17/11 0600  BP: 103/50 93/45 113/45 112/55  Pulse: 59 58 60 62  Temp:  97.7 F (36.5 C)    TempSrc:  Oral    Resp: 17 18 17 19   Height:      Weight:      SpO2: 98% 95% 98% 98%   Weight change:   Intake/Output Summary (Last 24 hours) at 03/17/11 0630 Last data filed at 03/17/11 0600  Gross per 24 hour  Intake 1883.88 ml  Output    900 ml  Net 983.88 ml   Physical Exam: General: NAD, Morbidly obese woman  Lungs: CTA B/L, decrease air entry at bases.  Heart: distant heart sounds, S1, S2, RRR, no murmur, rubs or gallor. No JVD appreciated, 2+ DP  Abdomen: Soft, obese, BS +,  nondistended  Extremities: No pedal edema, no cyanosis or clubbing  Neuro: nonfocal  Lab Results: Basic Metabolic Panel:  Lab 03/16/11 1610 03/15/11 0550  NA 139 136  K 3.7 3.7  CL 103 100  CO2 27 25  GLUCOSE 119* 141*  BUN 15 13  CREATININE 0.75 0.77  CALCIUM 9.6 9.5  MG -- --  PHOS -- --   Liver Function Tests:  Lab 03/14/11 0500 03/12/11 1427  AST 20 32  ALT 20 28  ALKPHOS 57 64  BILITOT 0.5 0.3  PROT 6.2 6.9  ALBUMIN 3.2* 3.6    Lab 03/12/11 1427  LIPASE 33  AMYLASE --   CBC:  Lab 03/16/11 0452 03/15/11 0550  WBC 10.3 11.2*  NEUTROABS -- --  HGB 11.8* 11.8*  HCT 35.9* 36.0  MCV 80.9 80.5  PLT 222 237   Cardiac Enzymes:  Lab 03/13/11 0939 03/12/11 1427 03/12/11 0722  CKTOTAL 194* 290* 112  CKMB 9.5* 21.4* 3.5  CKMBINDEX -- -- --  TROPONINI 2.50* 4.60* <0.30   D-Dimer:  Lab 03/12/11 0722  DDIMER 0.57*   CBG:  Lab 03/16/11 2153 03/16/11 1726 03/16/11 1210 03/16/11 0757 03/15/11 2112 03/15/11 1650  GLUCAP 114* 106* 198* 139* 148* 129*   Hemoglobin A1C:  Lab 03/14/11 0500  HGBA1C 6.6*   Fasting Lipid Panel:  Lab 03/14/11 0500  CHOL 151  HDL 54  LDLCALC 76  TRIG 104  CHOLHDL 2.8  LDLDIRECT --   Thyroid Function Tests:  Lab 03/12/11 1427  TSH 5.151*  T4TOTAL --  FREET4 --  T3FREE --  THYROIDAB --   Coagulation:  Lab 03/16/11 0452 03/15/11 0550 03/14/11 1010 03/14/11 0600  LABPROT 14.7 15.5* 18.2* 18.0*  INR 1.13 1.20 1.48 1.46   Urinalysis:  Lab 03/13/11 2054  COLORURINE YELLOW  LABSPEC <1.005*  PHURINE 5.5  GLUCOSEU NEGATIVE  HGBUR NEGATIVE  BILIRUBINUR NEGATIVE  KETONESUR NEGATIVE  PROTEINUR NEGATIVE  UROBILINOGEN 0.2  NITRITE NEGATIVE  LEUKOCYTESUR NEGATIVE     Micro Results: Recent Results (from the past 240 hour(s))  MRSA PCR SCREENING     Status: Normal   Collection Time   03/12/11  8:58 PM      Component Value Range Status Comment   MRSA by PCR NEGATIVE  NEGATIVE  Final   URINE CULTURE     Status:  Normal   Collection Time   03/13/11  8:57 PM      Component Value Range Status Comment   Specimen Description URINE, CATHETERIZED   Final    Special Requests NONE   Final    Culture  Setup Time [id number]   Final    Colony Count 20,OOO COLONIES/ML   Final    Culture     Final    Value: ESCHERICHIA COLI     GROUP B STREP(S.AGALACTIAE)ISOLATED     Note: TESTING AGAINST S. AGALACTIAE NOT ROUTINELY PERFORMED DUE TO PREDICTABILITY OF AMP/PEN/VAN SUSCEPTIBILITY.   Report Status 03/17/2011 FINAL   Final    Organism ID, Bacteria ESCHERICHIA COLI   Final   Medications:  Scheduled Meds:   . amLODipine  10 mg Oral Daily  . aspirin EC  81 mg Oral Daily  . carvedilol  6.25 mg Oral BID WC  . Fluticasone-Salmeterol  1 puff Inhalation BID  . insulin aspart  0-5 Units Subcutaneous QHS  . insulin aspart  10 Units Subcutaneous TID WC  . insulin glargine  20 Units Subcutaneous QHS  . levothyroxine  150 mcg Oral BH-q7a  . loratadine  10 mg Oral QPM  . meclizine  25 mg Oral BH-q7a  . olmesartan  40 mg Oral BH-q7a  . pantoprazole  40 mg Oral Daily  . prasugrel  10 mg Oral Daily  . rosuvastatin  20 mg Oral QHS  . triamterene-hydrochlorothiazide  1 each Oral BH-q7a   Continuous Infusions:   . sodium chloride Stopped (03/13/11 1800)  . sodium chloride 10 mL/hr at 03/15/11 1900  . heparin 2,000 Units/hr (03/17/11 0149)  . nitroGLYCERIN 20 mcg/min (03/16/11 2000)   PRN Meds:.acetaminophen, albuterol, ALPRAZolam, alum & mag hydroxide-simeth, HYDROcodone-acetaminophen, ipratropium, loperamide, magnesium hydroxide, morphine injection, ondansetron (ZOFRAN) IV, ondansetron Assessment/Plan: 1) NSTEMI: positive enzymes- Tr- 4.6-->2.5, but no ST changes. CT Angio Chest showed severe 3 vessel disease and cardiac cath showed complex plaque involving the distal left main, ostial LCx, and ostial LAD with preserved EF of 60%. CTVS evaluated patient - high risk for surgery and postop complications recommended PCI.    Lipid panel showed: LDL- 76, HDL 54 and T.chol 151 trig 104  - Continue Heparin, NTG gtt  - Cont BB, ARB and statin  -Plan for high risk PCI for critical disease of the distal LM bifurcation on Wednesday by Dr. Excell Seltzer  -Check P2Y12 today. Agree.  2) HTN (hypertension), benign: BP well controlled on home meds and NTG.   3) Non Insulin dependent Diabetes mellitus: A1c 6.8.  Improving CBGs 106-198 - Will Hold metformin, glipizide, and linagliptin while in hospital .  -Continue Lantus 20 units qhs  - meal coverage with Novolog 10 units TID   4) Hypothyroidism (acquired): TSH 5.15, not well-controlled.  -Continue Synthroid 150 mcg qAM  -Repeat TSH in 6-8 weeks as outpatient   5) Hyperlipidemia: LDL- 76, HDL 54 and T.chol 151 trig 104  - Continue Crestor 20 mg daily.   6) Recurrent pulmonary emboli: 3 PE previously whenever she was taken off Coumadin. This happens after she had breast tumors removed.  - continue to hold coumadin for PCI in AM  - cont Heparin gtt  -Continue Effient, and will check P2Y12 this AM- results pending  7) Hiatal hernia - stable  8) Obesity, Class III, BMI 40-49.9 (morbid obesity)- need weight loss   LOS: 5 days   [name],[name] 03/17/2011, 6:30 AM  Cardiology Attending  Patient seen and examined. I have reviewed the findings of Dr. Anselm Jungling and these concur with my findings on the history, physical exam, assessment and plan. The patient is scheduled to undergo PCI tomorrow.  Lewayne Bunting, M.D.

## 2011-03-18 NOTE — Transfer of Care (Signed)
Immediate Anesthesia Transfer of Care Note  Patient: [name]  Procedure(s) Performed: Procedure(s) (LRB): PERCUTANEOUS CORONARY STENT INTERVENTION (PCI-S) (N/A)  Patient Location: Cath Lab  Anesthesia Type: General  Level of Consciousness: awake, alert  and oriented  Airway & Oxygen Therapy: Patient Spontanous Breathing and Patient connected to face mask oxygen  Post-op Assessment: Report given to PACU RN  Post vital signs: Reviewed and stable  Complications: No apparent anesthesia complications

## 2011-03-18 NOTE — Preoperative (Signed)
Beta Blockers   Reason not to administer Beta Blockers:Not Applicable 

## 2011-03-19 LAB — BASIC METABOLIC PANEL
BUN: 19 mg/dL (ref 6–23)
CO2: 26 mEq/L (ref 19–32)
Calcium: 9.3 mg/dL (ref 8.4–10.5)
Chloride: 101 mEq/L (ref 96–112)
Creatinine, Ser: 0.87 mg/dL (ref 0.50–1.10)
Glucose, Bld: 96 mg/dL (ref 70–99)

## 2011-03-19 LAB — GLUCOSE, CAPILLARY
Glucose-Capillary: 96 mg/dL (ref 70–99)
Glucose-Capillary: 123 mg/dL — ABNORMAL HIGH (ref 70–99)
Glucose-Capillary: 93 mg/dL (ref 70–99)

## 2011-03-19 LAB — CBC
HCT: 34.5 % — ABNORMAL LOW (ref 36.0–46.0)
Hemoglobin: 10.9 g/dL — ABNORMAL LOW (ref 12.0–15.0)
MCH: 25.7 pg — ABNORMAL LOW (ref 26.0–34.0)
MCV: 81.4 fL (ref 78.0–100.0)
Platelets: 242 10*3/uL (ref 150–400)
RBC: 4.24 MIL/uL (ref 3.87–5.11)

## 2011-03-19 LAB — CARDIAC PANEL(CRET KIN+CKTOT+MB+TROPI): Relative Index: INVALID (ref 0.0–2.5)

## 2011-03-19 MED ORDER — ALBUTEROL SULFATE HFA 108 (90 BASE) MCG/ACT IN AERS
2.0000 | INHALATION_SPRAY | RESPIRATORY_TRACT | Status: DC
  Administered 2011-03-19 – 2011-03-20 (×4): 2 via RESPIRATORY_TRACT
  Filled 2011-03-19: qty 6.7

## 2011-03-19 MED FILL — Dextrose Inj 5%: INTRAVENOUS | Qty: 50 | Status: AC
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
## 2011-03-19 NOTE — Progress Notes (Signed)
Subjective: HPI: [name] is a 61 yo woman with PMH of T2DM, HTN, 3 pulmonary emboli whenever she was taken off Coumadin, so she is now on chronic warfarin therapy, HLD, COPD, Hypothyroidism who comes in with substernal chest pain, some neck fullness/sensation and lateral chest/shoulder pain with Elevated troponin, NSTEMI.   Cardiac cath on 2/15 by Dr. Shirlee Latch:  Left mainstem: 70-80% distal LM stenosis.  Left anterior descending (LAD): 80% ostial LAD stenosis. Luminal irregularities in the remainder of the LAD.  Left circumflex (LCx): There was a small ramus. 95% ostial LCx stenosis. The AV LCx was subtotally occluded after the moderate 1st obtuse marginal.  Right coronary artery (RCA): Diffuse disease in the mid RCA up to about 50% at the takeoff of the 2nd acute marginal. 50% proximal PDA stenosis.  Left ventriculography: Left ventricular systolic function is normal, LVEF is estimated at 60%.  Final Conclusions: There is complex plaque involving the distal left main, ostial LCx, and ostial LAD. The mid LCx is subtotally occluded. The LCx disease is likely the culprit for NSTEMI. She is no longer having chest pain. EF is preserved.   S/p high risk PCI day #1 with promus drug-eluting stent in distal left main into the LAD.  Multiple attempts at wiring left circumflex were all unsuccessful.  She is doing well this morning, denies any chest pain or SOB.  She does have some soreness on her right radial area.  Objective: Vital signs in last 24 hours: Filed Vitals:   03/19/11 0200 03/19/11 0300 03/19/11 0400 03/19/11 0500  BP: 101/50 103/52  100/57  Pulse: 57 59 73 58  Temp:   98.6 F (37 C)   TempSrc:   Oral   Resp: 21 18 24 19   Height:      Weight:      SpO2: 94% 90% 93% 92%   Weight change:   Intake/Output Summary (Last 24 hours) at 03/19/11 1610 Last data filed at 03/19/11 0400  Gross per 24 hour  Intake   2654 ml  Output   1600 ml  Net   1054 ml   Physical Exam: General:  NAD, Morbidly obese woman  Lungs: CTA B/L, decrease air entry at bases, mild inspiratory wheezing on left lung field.  Heart: distant heart sounds, S1, S2, RRR, no murmur, rubs or gallor. No JVD appreciated, 2+ DP  Abdomen: Soft, obese, BS +, nondistended  Extremities: No pedal edema, no cyanosis or clubbing.  Right radial: incision site appears clean, no erythema or drainage.  There is a small hematoma around the PCI incision site but has not expanded since yesterday. Neuro: nonfocal  Lab Results: Basic Metabolic Panel:  Lab 03/18/11 9604 03/17/11 0630  NA 137 139  K 4.1 3.7  CL 103 102  CO2 25 27  GLUCOSE 120* 123*  BUN 19 19  CREATININE 0.86 0.89  CALCIUM 9.7 9.5  MG -- --  PHOS -- --   Liver Function Tests:  Lab 03/14/11 0500 03/12/11 1427  AST 20 32  ALT 20 28  ALKPHOS 57 64  BILITOT 0.5 0.3  PROT 6.2 6.9  ALBUMIN 3.2* 3.6    Lab 03/12/11 1427  LIPASE 33  AMYLASE --   CBC:  Lab 03/18/11 0455 03/17/11 0630  WBC 10.2 9.4  NEUTROABS -- --  HGB 11.7* 11.6*  HCT 35.5* 36.0  MCV 80.1 81.6  PLT 256 250   Cardiac Enzymes:  Lab 03/18/11 2142 03/18/11 1414 03/13/11 0939  CKTOTAL 51 26 194*  CKMB 1.7 1.6 9.5*  CKMBINDEX -- -- --  TROPONINI <0.30 <0.30 2.50*  D-Dimer:  Lab 03/12/11 0722  DDIMER 0.57*   CBG:  Lab 03/18/11 2128 03/18/11 1653 03/18/11 1253 03/18/11 0851 03/17/11 2117 03/17/11 1710  GLUCAP 108* 124* 140* 141* 149* 144*   Hemoglobin A1C:  Lab 03/14/11 0500  HGBA1C 6.6*   Fasting Lipid Panel:  Lab 03/14/11 0500  CHOL 151  HDL 54  LDLCALC 76  TRIG 104  CHOLHDL 2.8  LDLDIRECT --   Thyroid Function Tests:  Lab 03/12/11 1427  TSH 5.151*  T4TOTAL --  FREET4 --  T3FREE --  THYROIDAB --   Coagulation:  Lab 03/17/11 0630 03/16/11 0452 03/15/11 0550 03/14/11 1010  LABPROT 14.5 14.7 15.5* 18.2*  INR 1.11 1.13 1.20 1.48   Urinalysis:  Lab 03/13/11 2054  COLORURINE YELLOW  LABSPEC <1.005*  PHURINE 5.5  GLUCOSEU NEGATIVE    HGBUR NEGATIVE  BILIRUBINUR NEGATIVE  KETONESUR NEGATIVE  PROTEINUR NEGATIVE  UROBILINOGEN 0.2  NITRITE NEGATIVE  LEUKOCYTESUR NEGATIVE     Micro Results: Recent Results (from the past 240 hour(s))  MRSA PCR SCREENING     Status: Normal   Collection Time   03/12/11  8:58 PM      Component Value Range Status Comment   MRSA by PCR NEGATIVE  NEGATIVE  Final   URINE CULTURE     Status: Normal   Collection Time   03/13/11  8:57 PM      Component Value Range Status Comment   Specimen Description URINE, CATHETERIZED   Final    Special Requests NONE   Final    Culture  Setup Time [id number]   Final    Colony Count 20,OOO COLONIES/ML   Final    Culture     Final    Value: ESCHERICHIA COLI     GROUP B STREP(S.AGALACTIAE)ISOLATED     Note: TESTING AGAINST S. AGALACTIAE NOT ROUTINELY PERFORMED DUE TO PREDICTABILITY OF AMP/PEN/VAN SUSCEPTIBILITY.   Report Status 03/17/2011 FINAL   Final    Organism ID, Bacteria ESCHERICHIA COLI   Final    Medications:  Scheduled Meds:   . amLODipine  10 mg Oral Daily  . aspirin EC  81 mg Oral Daily  . bivalirudin      . bivalirudin      . carvedilol  6.25 mg Oral BID WC  . enoxaparin (LOVENOX) injection  40 mg Subcutaneous Q24H  . Fluticasone-Salmeterol  1 puff Inhalation BID  . heparin      . insulin aspart  0-5 Units Subcutaneous QHS  . insulin aspart  10 Units Subcutaneous TID WC  . insulin glargine  20 Units Subcutaneous QHS  . levothyroxine  150 mcg Oral BH-q7a  . lidocaine      . lidocaine      . loratadine  10 mg Oral QPM  . meclizine  25 mg Oral BH-q7a  . nitroGLYCERIN      . olmesartan  40 mg Oral BH-q7a  . pantoprazole  40 mg Oral Daily  . prasugrel  10 mg Oral Daily  . rosuvastatin  20 mg Oral QHS  . triamterene-hydrochlorothiazide  1 each Oral BH-q7a  . verapamil      . DISCONTD: enoxaparin  40 mg Subcutaneous Q24H  . DISCONTD: sodium chloride  3 mL Intravenous Q12H   Continuous Infusions:   . sodium chloride 75 mL/hr  at 03/18/11 0950  . sodium chloride 75 mL/hr at 03/18/11 1756  . nitroGLYCERIN Stopped (03/18/11  4098)  . DISCONTD: sodium chloride Stopped (03/13/11 1800)  . DISCONTD: sodium chloride 75 mL/hr (03/18/11 0400)  . DISCONTD: heparin 2,000 Units/hr (03/18/11 0510)   PRN Meds:.acetaminophen, albuterol, ALPRAZolam, alum & mag hydroxide-simeth, HYDROcodone-acetaminophen, ipratropium, loperamide, magnesium hydroxide, morphine injection, ondansetron (ZOFRAN) IV, ondansetron, DISCONTD: sodium chloride, DISCONTD: acetaminophen, DISCONTD: ondansetron (ZOFRAN) IV, DISCONTD: sodium chloride Assessment/Plan: 1) NSTEMI: positive enzymes on admission- Tr- 4.6-->2.5, but no ST changes. CT Angio Chest showed severe 3 vessel disease and cardiac cath showed complex plaque involving the distal left main, ostial LCx, and ostial LAD with preserved EF of 60%. CTVS evaluated patient - high risk for surgery and postop complications recommended PCI. Repeat 2D echo on 2/17 showed EF 50-55% with mild posterior hypokinesis  Lipid panel showed: LDL- 76, HDL 54 and T.chol 151 trig 104 .  P2Y12: 210  -CEx2 post PCI have been negative - Continue ASA , Effient  - Cont BB, ARB and statin but will decrease her Coreg to 3.125 since her HR is low in the 50-60's.  2) HTN (hypertension), benign: BP well controlled on home meds but will decrease her Coreg to 3.125mg  bid given soft BP and low HR   3) Non Insulin dependent Diabetes mellitus: A1c 6.8. Well-controlled with insulin CBGs 108-141 - Will Hold metformin, glipizide, and linagliptin while in hospital .  -Continue Lantus 20 units qhs  - meal coverage with Novolog 10 units TID   4) Hypothyroidism (acquired): TSH 5.15, not well-controlled.  -Continue Synthroid 150 mcg qAM  -Repeat TSH in 6-8 weeks as outpatient   5) Hyperlipidemia: LDL- 76, HDL 54 and T.chol 151 trig 104  - Continue Crestor 20 mg daily.   6) Recurrent pulmonary emboli: 3 PE previously whenever she was taken  off Coumadin. This happens after she had breast tumors removed.   -Continue Effient  -Continue to hold Warfarin 2/2 to hematoma on right wrist post PCI  7) Hiatal hernia - stable   8) Obesity, Class III, BMI 40-49.9 (morbid obesity)- need weight loss       LOS: 7 days   HO,MICHELE 03/19/2011, 6:28 AM  As above; patient seen and examined; agree with above; no chest pain. Right radial with small hematoma. Plan continue present meds including effient and ASA. Reviewed with Dr Excell Seltzer. She is at increased risk for DVT and PE. However, we feel risk of adding coumadin to dual antiplatelet therapy outweighs benefit. Will not resume coumadin. Note, last PE was in the 1980s. Transfer to telemetry and ambulate. Olga Millers

## 2011-03-19 NOTE — Progress Notes (Signed)
   CARE MANAGEMENT NOTE 03/19/2011  Patient:  [name], [name]   Account Number:  [id number]  Date Initiated:  03/19/2011  Documentation initiated by:  GRAVES-BIGELOW,Kataleena Holsapple  Subjective/Objective Assessment:   Pt admitted with Chest pain. Cardiac cath on 2/15 -post pci 03-18-11. Pt lives at home with husband. PT has been working with pt- recommends HHPT for safety evaluation and treatment.     Action/Plan:   CM spoke to pt and she will benefit from [facility] for disease managment and HH PT. She has used [facility] in the past for services and wants to use them again. Pt has RW and BSC at home.   Anticipated DC Date:  03/21/2011   Anticipated DC Plan:  HOME W HOME HEALTH SERVICES      DC Planning Services  CM consult      [facility] Choice  HOME HEALTH   Choice offered to / List presented to:  C-1 Patient           Status of service:  In process, will continue to follow Medicare Important Message given?   (If response is "NO", the following Medicare IM given date fields will be blank) Date Medicare IM given:   Date Additional Medicare IM given:    Discharge Disposition:    Per UR Regulation:    Comments:  03-19-11 1237 Tomi Bamberger, RN,BSN [phone] CM will ask for orders for above services listed. Unsure of d/c date at this time. Will continue to monitor for disposition.

## 2011-03-19 NOTE — Progress Notes (Signed)
Physical Therapy Treatment Patient Details Name: [name] MRN: [id number] DOB: 08-25-1950 Today's Date: 03/19/2011  PT Assessment/Plan  PT - Assessment/Plan Comments on Treatment Session: Pt continues to make good progress with mobility. PT Plan: Discharge plan remains appropriate Follow Up Recommendations: Home health PT Equipment Recommended: None recommended by PT PT Goals  Acute Rehab PT Goals PT Goal: Supine/Side to Sit - Progress: Met PT Goal: Sit to Supine/Side - Progress: Met PT Goal: Sit to Stand - Progress: Met PT Goal: Stand to Sit - Progress: Met PT Goal: Ambulate - Progress: Progressing toward goal  PT Treatment Precautions/Restrictions  Precautions Precautions: Fall Required Braces or Orthoses: No Restrictions Weight Bearing Restrictions: No Mobility (including Balance) Bed Mobility Supine to Sit: 6: Modified independent (Device/Increase time) Sit to Supine: 6: Modified independent (Device/Increase time) Transfers Sit to Stand: 6: Modified independent (Device/Increase time);From bed;With upper extremity assist Stand to Sit: 6: Modified independent (Device/Increase time);To bed;With upper extremity assist Ambulation/Gait Ambulation/Gait Assistance: 5: Supervision Ambulation Distance (Feet): 125 Feet Assistive device: Rolling walker Gait Pattern: Decreased stride length    Exercise    End of Session PT - End of Session Activity Tolerance: Patient tolerated treatment well Patient left: in bed;with call bell in reach Nurse Communication: Mobility status for ambulation;Mobility status for transfers General Behavior During Session: [facility] for tasks performed Cognition: [facility] for tasks performed  [facility] 03/19/2011, 3:10 PM  [facility] PT [phone]

## 2011-03-20 MED ORDER — CARVEDILOL 3.125 MG PO TABS
3.1250 mg | ORAL_TABLET | Freq: Two times a day (BID) | ORAL | Status: DC
  Filled 2011-03-20 (×2): qty 1

## 2011-03-20 MED ORDER — PRASUGREL HCL 10 MG PO TABS: 10.0000 mg | ORAL_TABLET | Freq: Every day | ORAL | Status: DC

## 2011-03-20 MED ORDER — ASPIRIN 81 MG PO TBEC: 81.0000 mg | DELAYED_RELEASE_TABLET | Freq: Every day | ORAL | Status: AC

## 2011-03-20 MED ORDER — GLIPIZIDE-METFORMIN HCL 2.5-500 MG PO TABS: 1.0000 | ORAL_TABLET | Freq: Two times a day (BID) | ORAL | Status: DC

## 2011-03-20 MED ORDER — LEVOTHYROXINE SODIUM 150 MCG PO TABS: 150.0000 ug | ORAL_TABLET | ORAL | Status: AC

## 2011-03-20 MED ORDER — CARVEDILOL 3.125 MG PO TABS: 3.1250 mg | ORAL_TABLET | Freq: Two times a day (BID) | ORAL | Status: DC

## 2011-03-20 MED ORDER — ALBUTEROL SULFATE HFA 108 (90 BASE) MCG/ACT IN AERS
2.0000 | INHALATION_SPRAY | Freq: Four times a day (QID) | RESPIRATORY_TRACT | Status: DC
  Administered 2011-03-20 (×2): 2 via RESPIRATORY_TRACT

## 2011-03-20 MED ORDER — ROSUVASTATIN CALCIUM 20 MG PO TABS: 20.0000 mg | ORAL_TABLET | Freq: Every day | ORAL | Status: DC

## 2011-03-20 MED ORDER — NITROGLYCERIN 0.4 MG SL SUBL: 0.4000 mg | SUBLINGUAL_TABLET | SUBLINGUAL | Status: DC | PRN

## 2011-03-20 NOTE — Progress Notes (Signed)
03-20-11 1419 CM will provide pt with 30 day free and co pay card for effient. MD to write rx for 30 day free no refills and then the original with refills. Thanks. Gala Lewandowsky, RN,BSN, [phone]

## 2011-03-20 NOTE — Progress Notes (Signed)
Cardiology Progress Note Patient Name: [name] Date of Encounter: 03/20/2011, 7:53 AM     Subjective  No overnight events. Patient doing well. Able to ambulate without complaints of chest pain. Feels ready to go home with home health RN and PT.   Objective   Telemetry: Sinus rhythm 50s-80s  Medications: . albuterol  2 puff Inhalation Q6H  . amLODipine  10 mg Oral Daily  . aspirin EC  81 mg Oral Daily  . carvedilol  6.25 mg Oral BID WC  . enoxaparin (LOVENOX) injection  40 mg Subcutaneous Q24H  . Fluticasone-Salmeterol  1 puff Inhalation BID  . insulin aspart  0-5 Units Subcutaneous QHS  . insulin aspart  10 Units Subcutaneous TID WC  . insulin glargine  20 Units Subcutaneous QHS  . levothyroxine  150 mcg Oral BH-q7a  . loratadine  10 mg Oral QPM  . meclizine  25 mg Oral BH-q7a  . olmesartan  40 mg Oral BH-q7a  . pantoprazole  40 mg Oral Daily  . prasugrel  10 mg Oral Daily  . rosuvastatin  20 mg Oral QHS  . triamterene-hydrochlorothiazide  1 each Oral BH-q7a   . sodium chloride 75 mL/hr at 03/18/11 0950    Physical Exam: Temp:  [97.9 F (36.6 C)-98.9 F (37.2 C)] 97.9 F (36.6 C) (02/22 0500) Pulse Rate:  [56-63] 57  (02/22 0500) Resp:  [18-20] 20  (02/22 0500) BP: (104-127)/(51-72) 123/72 mmHg (02/22 0500) SpO2:  [94 %-100 %] 97 % (02/22 0500) Weight:  [323 lb 3.1 oz (146.6 kg)] 323 lb 3.1 oz (146.6 kg) (02/22 0500)  General: Morbidly obese white female, in no acute distress. Head: Normocephalic, atraumatic, sclera non-icteric, nares are without discharge.  Neck: Supple. Negative for carotid bruits. No appreciable JVD. Lungs: Distant breath sounds with fine bibasilar rales. Otherwise clear to auscultation without wheezes or rhonchi. Breathing is unlabored. Heart: Distant heart sounds. RRR S1 S2 without murmurs, rubs, or gallops.  Abdomen: Obese, Soft, non-tender, non-distended with normoactive bowel sounds. No rebound/guarding. No obvious  abdominal masses. Msk:  Strength and tone appear normal for age. Extremities: Right wrist hematoma outlined with marker, appears stable. No edema. No clubbing or cyanosis. Distal pedal pulses are 2+ and equal bilaterally. Neuro: Alert and oriented X 3. Moves all extremities spontaneously. Psych:  Responds to questions appropriately with a normal affect.   Intake/Output Summary (Last 24 hours) at 03/20/11 0753 Last data filed at 03/20/11 0400  Gross per 24 hour  Intake   1040 ml  Output   1053 ml  Net    -13 ml    Labs:  Basename 03/19/11 0600 03/18/11 0455  NA 136 137  K 4.0 4.1  CL 101 103  CO2 26 25  GLUCOSE 96 120*  BUN 19 19  CREATININE 0.87 0.86  CALCIUM 9.3 9.7     03/12/2011 14:27  Alkaline Phosphatase 64  Albumin 3.6  Lipase 33  AST 32  ALT 28  Total Protein 6.9  Bilirubin, Direct <0.1  Indirect Bilirubin NOT CALCULATED  Total Bilirubin 0.3   Basename 03/19/11 0600 03/18/11 0455  WBC 8.3 10.2  HGB 10.9* 11.7*  HCT 34.5* 35.5*  MCV 81.4 80.1  PLT 242 256    03/12/2011 07:22 03/12/2011 14:27 03/13/2011 09:39 03/18/2011 14:14 03/18/2011 21:42 03/19/2011 06:00  CK, MB 3.5 21.4 (HH) 9.5 (HH) 1.6 1.7 1.8  CK Total 112 290 (H) 194 (H) 26 51 52  Troponin I <0.30 4.60 (HH)  2.50 (HH) <0.30 <0.30 <0.30     03/12/2011 14:27  TSH 5.151 (H)     03/12/2011 14:27 03/14/2011 05:00  Hemoglobin A1C 6.8 (H) 6.6 (H)    03/12/2011 14:28  Cholesterol 172  Triglycerides 123  HDL 59  LDL (calc) 88  VLDL 25  Total CHOL/HDL Ratio 2.9     03/12/2011 07:22  D-Dimer, Quant 0.57 (H)   Radiology/Studies:   03/18/11 - PTCA and stenting of the distal left main Conclusions: Successful stenting of the distal left mainstem and of the LAD w/ 4.0 x 16 mm Promus element drug-eluting stent. Occlusion of the left circumflex at the procedure completion.  Recommendations: Lifelong dual antiplatelet therapy with aspirin and Effient  03/13/11 - Left Heart Cath, Selective Coronary Angiography, LV  angiography Hemodynamics:  AO 126/80  LV 139/14  Coronary angiography:  Coronary dominance: right  Left mainstem: 70-80% distal LM stenosis.  Left anterior descending (LAD): 80% ostial LAD stenosis. Luminal irregularities in the remainder of the LAD.  Left circumflex (LCx): There was a small ramus. 95% ostial LCx stenosis. The AV LCx was subtotally occluded after the moderate 1st obtuse marginal.  Right coronary artery (RCA): Diffuse disease in the mid RCA up to about 50% at the takeoff of the 2nd acute marginal. 50% proximal PDA stenosis.  Left ventriculography: Left ventricular systolic function is normal, LVEF is estimated at 60%.  Final Conclusions: There is complex plaque involving the distal left main, ostial LCx, and ostial LAD. The mid LCx is subtotally occluded. The LCx disease is likely the culprit for NSTEMI. She is no longer having chest pain. EF is preserved.  Recommendations: Not a good target for percutaneous revascularization. However, she is also a high risk candidate for CABG. Her body habitus is very difficult. She is unlikely to do well with medical management. I will restart heparin gtt tonight. I will consult CVTS for an opinion.   03/15/11 - 2D Echocardiogram Study Conclusions:   - Left ventricle: Possible mild posterior hypokinesis. However, even with contrast injection posterior wall difficult to visualize. No  other obvious segmental wall motion abnormalities. The cavity size was normal. Wall thickness was normal. Systolic function was normal. The estimated ejection fraction was in the range of 50% to 55%. Left ventricular diastolic function parameters were normal. - Aortic valve: Valve area: 1.88cm^2(VTI). Valve area: 1.88cm^2 (Vmax).  03/14/2011 - CXR Findings: Both views degraded by patient body habitus.  The lateral is also degraded by patient arm position. Midline trachea. Cardiomegaly accentuated by AP portable technique.  No pleural effusion or pneumothorax.  Low  lung volumes with resultant pulmonary interstitial prominence.  IMPRESSION: Degraded exam, with cardiomegaly and low lung volumes, but no acute disease.    03/12/2011 - CTA Chest Findings: No acute pulmonary embolus noted.  Prominent three-vessel coronary artery calcifications. Cardiomegaly.  Prominent epicardial fat.  Aortic valve calcifications.  Calcification of the thoracic aorta. No obvious dissection.  Evaluation limited by cardiac motion.  No aneurysmal dilation of the thoracic aorta.  Calcification origin of the great vessels.  Shifting atelectatic changes lung bases and lingula.  Tiny right apical nodule unchanged.  Mild substernal extension of the left lobe of thyroid gland. Scattered small mediastinal lymph nodes.  Mild fatty infiltration of the liver.  Visualized upper abdominal structures otherwise unremarkable.  Mild degenerative changes thoracic spine without bony destructive lesion.  IMPRESSION: No acute pulmonary embolus.  Prominent three-vessel coronary artery calcifications.  Atherosclerotic type changes thoracic aorta and great vessels.  Cardiomegaly.  Shifting atelectatic changes lung bases and lingula.  Stable tiny right upper lobe nodule.      Assessment and Plan  39 yof w/ PMH of morbid obesity, DMII, HLD, HTN, 3 pulmonary emboli when taken off Coumadin (now on chronic warfarin therapy), COPD, and hypothyroidism who presented w/ substernal chest pain and elevated troponin.  1. NSTEMI: CTA Chest showed severe 3 vessel disease and cardiac cath showed complex plaque involving the distal left main, ostial LCx, and ostial LAD with preserved EF of 60%. CTVS evaluated patient - high risk for surgery and postop complications, recommended PCI. S/p high risk PCI w/ Promus DES in distal Left Main into the LAD and unsuccessful PCI of LCx on 03/18/11. PostPCI CE negative x3. P2Y12 210.  - Cont DAPT w/ ASA & Effient (lifelong) - Cont ARB, BB, and statin. Yesterday's note commented on changing  Coreg to 3.125 for HR 50s-60s, but it is still at 6.25mg . Will change today.  2. HTN: BPs stable on home meds. Coreg decreased as above.  3. HLD: LDL- 76, HDL 54 and T.chol 151 trig 104.  - Cont Crestor 20mg  daily  4. NIDDM: A1c 6.8. CBGs well controlled.  - Hold oral hypoglycemics while in hospital.  - Cont Lantus 20units QHS and Meal coverage w/ Novolog 10units TID.  5. Hypothyroidism (acquired): TSH 5.15, not well controlled.  - Cont Synthroid QAM.  - Repeat TSH in 6-8wks as outpatient  6) Recurrent pulmonary emboli: 3 PE previously whenever she was taken off Coumadin, last in 1980s after she had breast tumors removed.  - On DAPT w/ ASA & Effient  - Continue to hold Warfarin as risk of adding it to DAPT outweighs the benefit.  7) Morbid Obesity -  needs weight loss  Disposition: MD to assess timing of discharge. PT recommends Home health PT for safety evaluation and treatment. Case Management and patient feel she would benefit from health RN assistance for disease management. Will order.  Signed, HOPE, JESSICA PA-C  Patient seen, examined. Available data reviewed. Agree with findings, assessment, and plan as outlined by [facility], PA-C. Pt is stable and ready for discharge. Home health being arranged. I agree that a plan of ASA and effient is appropriate. I think the risk of triple therapy with warfarin exceeds the benefit. She hasn't had a DVT/PE in well over a decade. I discussed the importance of getting up on her feet as much as possible to prevent stasis. Pt to follow-up in the [place] office in 2 weeks.  Tonny Bollman, M.D. 03/20/2011 2:12 PM

## 2011-03-20 NOTE — Discharge Summary (Signed)
Discharge Summary   Patient ID: [name] MRN: [id number], DOB/AGE: March 13, 1950 61 y.o.  Primary MD: Dwana Melena, MD Primary Cardiologist: Nona Dell MD  Admit date: 03/12/2011 D/C date:     03/20/2011      Primary Discharge Diagnoses:  1. NSTEMI w/ newly diagnosed CAD  - Initially negative CE, then turned positive w/ peak troponin 4.6  - CTA chest showed 3V coronary artery calcifications and atherosclerotic type changes to thoracic aorta and great vessels  - Diagnostic cath 03/13/11 revealed complex plaque involving the distal left main, ostial LCx, and ostial LAD w/ EF 60%  - TCTS evaluated and felt high risk for surgery and post op complications and recommended PCI  - Cardiac cath 03/18/11 w/ PTCA/DES to distal Left main into the LAD, unsuccessful PCI of LCx  - Recommended lifelong DAPT w/ ASA & Effient; P2Y12 210 2. H/o recurrent Pulmonary Emboli  - 3 previous PEs when taken off coumadin, last in 1980s   - DDimer mildly elevated this admission, CTA negative for PE  - Coumadin dc'd 2/2 new initiation of DAPT 3. Hypothyroidism (Acquired)  - Poorly controlled w/ TSH elevated at 5.15  - Synthroid increased to  - Needs repeat TSH in 6-8wks w/ outpatient f/u 4. Hyperlipidemia  - LDL 88  - Statin changed from Lipitor 20mg  to Crestor 20mg   - Will need lipid and LFTs in 6-8wks w/ outpatient f/u  Secondary Discharge Diagnoses:  1. Hypertension 2. Diabetes Mellitus, Type 2 - A1C 6.8 3. Morbid Obesity 4. COPD 5. Ovarian CA 6. Hiatal Hernia 7. Cholecystectomy 8. Appendectomy  Allergies Allergen Reactions  . Codeine Shortness Of Breath and Itching  . Penicillins Rash  . Sulfa Antibiotics Rash    Diagnostic Studies/Procedures:  03/18/11 - PTCA and stenting of the distal left main  Conclusions: Successful stenting of the distal left mainstem and of the LAD w/ 4.0 x 16 mm Promus element drug-eluting stent. Occlusion of the left circumflex at the procedure  completion.  Recommendations: Lifelong dual antiplatelet therapy with aspirin and Effient   03/13/11 - Left Heart Cath, Selective Coronary Angiography, LV angiography  Hemodynamics:  AO 126/80  LV 139/14  Coronary angiography:  Coronary dominance: right  Left mainstem: 70-80% distal LM stenosis.  Left anterior descending (LAD): 80% ostial LAD stenosis. Luminal irregularities in the remainder of the LAD.  Left circumflex (LCx): There was a small ramus. 95% ostial LCx stenosis. The AV LCx was subtotally occluded after the moderate 1st obtuse marginal.  Right coronary artery (RCA): Diffuse disease in the mid RCA up to about 50% at the takeoff of the 2nd acute marginal. 50% proximal PDA stenosis.  Left ventriculography: Left ventricular systolic function is normal, LVEF is estimated at 60%.  Final Conclusions: There is complex plaque involving the distal left main, ostial LCx, and ostial LAD. The mid LCx is subtotally occluded. The LCx disease is likely the culprit for NSTEMI. She is no longer having chest pain. EF is preserved.  Recommendations: Not a good target for percutaneous revascularization. However, she is also a high risk candidate for CABG. Her body habitus is very difficult. She is unlikely to do well with medical management. I will restart heparin gtt tonight. I will consult CVTS for an opinion.   03/15/11 - 2D Echocardiogram  Study Conclusions:  - Left ventricle: Possible mild posterior hypokinesis. However, even with contrast injection posterior wall difficult to visualize. No other obvious segmental wall motion abnormalities. The cavity size was normal. Wall  thickness was normal. Systolic function was normal. The estimated ejection fraction was in the range of 50% to 55%. Left ventricular diastolic function parameters were normal. - Aortic valve: Valve area: 1.88cm^2(VTI). Valve area: 1.88cm^2 (Vmax).   03/12/2011 - CTA Chest  Findings: No acute pulmonary embolus noted. Prominent  three-vessel coronary artery calcifications. Cardiomegaly. Prominent epicardial fat. Aortic valve calcifications. Calcification of the thoracic aorta. No obvious dissection. Evaluation limited by cardiac motion. No aneurysmal dilation of the thoracic aorta. Calcification origin of the great vessels. Shifting atelectatic changes lung bases and lingula. Tiny right apical nodule unchanged. Mild substernal extension of the left lobe of thyroid gland. Scattered small mediastinal lymph nodes. Mild fatty infiltration of the liver. Visualized upper abdominal structures otherwise unremarkable. Mild degenerative changes thoracic spine without bony destructive lesion. IMPRESSION: No acute pulmonary embolus. Prominent three-vessel coronary artery calcifications. Atherosclerotic type changes thoracic aorta and great vessels. Cardiomegaly. Shifting atelectatic changes lung bases and lingula. Stable tiny right upper lobe nodule.    History of Present Illness: 70 yof w/ PMH of morbid obesity, DMII, HLD, HTN, 3 pulmonary emboli when taken off Coumadin (now on chronic warfarin therapy), COPD, and hypothyroidism who presented to [facility] w/ substernal chest pain and ruled in for NSTEMI with subsequent transfer to [facility] on 03/13/11.  Hospital Course: At [facility] her EKG revealed sinus tachycardia 103 with nonspecific ST/T wave changes, CXR showed mild cardiomegaly with central pulmonary vascular prominance and calcified mildly tortuous aorta, and initial cardiac enzyme negative. Her electrolytes were WNL and vital signs stable, but with her history of PE, subtherapeutic INR, and mildly elevated DDimer a CTA chest was completed revealing no acute PE, but prominent 3V coronary artery calcifications and atherosclerotic type changes to thoracic aorta and great vessels. She was admitted to medicine for further evaluation and treatment. Her cardiac enzymes were cycled and she ruled in for a NSTEMI with  peak troponin 4.6 for which she was transferred to [facility].  At [facility] she underwent diagnostic cardiac catheterization on 03/13/11 revealing complex plaque involving the distal left main, ostial LCx, and ostial LAD w/ EF 60% with the LCx dz likely the culprit for the NSTEMI. She tolerated the procedure well without complications. It was felt there was no good target for percutaneous revascularization and TCTS was consulted for recommendations on possible CABG.  TCTS felt that due to her morbid obesity and comorbidities she was at too high of risk for surgery and post op complications and recommended PCI. An echocardiogram was performed on 03/15/11 with findings as noted above.   Dr. Excell Seltzer reviewed her cath films in detail and discussed her case with Dr. Cornelius Moras and Dr. Shirlee Latch and felt that PCI was a reasonable alternative to surgery, but at a much higher risk than normal. The risks and benefits were discussed with the patient and her family and they agreed to undergo cardiac catheterization. As she was unable to lie flat on the cath table it was felt the procedure would need to be done in a controlled environment with general anesthesia. She was loaded with Effient and had P2Y12 testing with PRU 210. Cardiac cath was performed under general anesthesia on 03/18/11 w/ PTCA/DES to the distal Left main into the LAD. Multiple attempts at wiring the LCx were all unsuccessful. She tolerated the procedure well without complications. Recommendations were made for lifelong DAPT w/ ASA & Effient. She was provided with 30day Effient card. Due to the need for DAPT it  was felt the risk of resuming coumadin for h/o PEs outweighed the benefit as it has been over 22yrs since her last PE. Post-cath cardiac enzymes were cycled and remained negative. She had some bradycardia with heart rates in the 50s-60s for which her Coreg was decreased to 3.125mg  bid with hopes for uptitration as tolerated as an  outpatient.  Lipitor was changed to Crestor 20mg  and she will therefore need follow up lipid panel and LFTs in 6-8wks with outpatient follow up. TSH was elevated at 5.15 for which her synthroid was increased to daily. She will need repeat TSH in 6-8wks with outpatient follow up.  She was evaluated by physical therapy who felt she would benefit from home health PT to evaluate for safety and outpatient treatment. Case management also evaluated her and felt she would benefit from home health RN for disease management. Both of these services were arranged by case management.  She was seen and evaluated by Dr. Excell Seltzer who felt she was stable for discharge home with plans for follow up as scheduled below.  Discharge Vitals: Blood pressure 126/67, pulse 74, temperature 97.9 F (36.6 C), temperature source Oral, resp. rate 20, height 5\' 1"  (1.549 m), weight 323 lb 3.1 oz (146.6 kg), SpO2 98.00%.  Labs: Component Value Date   WBC 8.3 03/19/2011   HGB 10.9* 03/19/2011   HCT 34.5* 03/19/2011   MCV 81.4 03/19/2011   PLT 242 03/19/2011    Lab 03/19/11 0600 03/14/11 0500  NA 136 --  K 4.0 --  CL 101 --  CO2 26 --  BUN 19 --  CREATININE 0.87 --  CALCIUM 9.3 --  PROT -- 6.2  BILITOT -- 0.5  ALKPHOS -- 57  ALT -- 20  AST -- 20  GLUCOSE 96 --    03/12/2011 07:22  03/12/2011 14:27  03/13/2011 09:39  03/18/2011 14:14  03/18/2011 21:42  03/19/2011 06:00   CK, MB  3.5  21.4 (HH)  9.5 (HH)  1.6  1.7  1.8   CK Total  112  290 (H)  194 (H)  26  51  52   Troponin I  <0.30  4.60 (HH)  2.50 (HH)  <0.30  <0.30  <0.30     03/12/2011 14:27   TSH  5.151 (H)     03/12/2011 14:27  03/14/2011 05:00   Hemoglobin A1C  6.8 (H)  6.6 (H)     03/12/2011 14:28   Cholesterol  172   Triglycerides  123   HDL  59   LDL (calc)  88   VLDL  25   Total CHOL/HDL Ratio  2.9     03/12/2011 07:22   D-Dimer, Quant  0.57 (H)      03/17/2011 06:30  Platelet Function  P2Y12 210    Discharge Medications   Medication List   As of 03/20/2011  2:31 PM   STOP taking these medications         atorvastatin 20 MG tablet      glipiZIDE 5 MG tablet      hydrALAZINE 25 MG tablet      HYDROcodone-acetaminophen 5-500 MG per tablet      OVER THE COUNTER MEDICATION      warfarin 2 MG tablet      warfarin 5 MG tablet         TAKE these medications         albuterol (2.5 MG/3ML) 0.083% nebulizer solution   Commonly known as: PROVENTIL   Take  2.5 mg by nebulization every 6 (six) hours as needed. For wheezing. Combines with atrovent      amLODipine 10 MG tablet   Commonly known as: NORVASC   Take 1 tablet (10 mg total) by mouth daily.      aspirin 81 MG EC tablet   Take 1 tablet (81 mg total) by mouth daily.      carvedilol 3.125 MG tablet   Commonly known as: COREG   Take 1 tablet (3.125 mg total) by mouth 2 (two) times daily with a meal.      cetirizine 10 MG tablet   Commonly known as: ZYRTEC   Take 10 mg by mouth every morning.      esomeprazole 40 MG capsule   Commonly known as: NEXIUM   Take 40 mg by mouth daily before breakfast.      Fluticasone-Salmeterol 250-50 MCG/DOSE Aepb   Commonly known as: ADVAIR   Inhale 1 puff into the lungs 2 (two) times daily.      glipiZIDE-metformin 2.5-500 MG per tablet   Commonly known as: METAGLIP   Take 1 tablet by mouth 2 (two) times daily before a meal. Please hold your Metformin for 48hrs after procedure. Resume on 03/21/11      HYDROcodone-acetaminophen 5-325 MG per tablet   Commonly known as: NORCO   Take 1 tablet by mouth every 6 (six) hours as needed. For pain      ipratropium 0.02 % nebulizer solution   Commonly known as: ATROVENT   Take 500 mcg by nebulization every 6 (six) hours as needed. For shortness of breath. Combines it with Albuterol      Iron 325 (65 FE) MG Tabs   Take 1 tablet by mouth daily after breakfast.      levocetirizine 5 MG tablet   Commonly known as: XYZAL   Take 5 mg by mouth every evening.      levothyroxine 150 MCG  tablet   Commonly known as: SYNTHROID, LEVOTHROID   Take 1 tablet (150 mcg total) by mouth every morning.      meclizine 25 MG tablet   Commonly known as: ANTIVERT   Take 25 mg by mouth every morning.      nitroGLYCERIN 0.4 MG SL tablet   Commonly known as: NITROSTAT   Place 1 tablet (0.4 mg total) under the tongue every 5 (five) minutes as needed for chest pain (up to 3 doses).      olmesartan 40 MG tablet   Commonly known as: BENICAR   Take 40 mg by mouth every morning.      ONGLYZA 5 MG Tabs tablet   Generic drug: saxagliptin HCl   Take 5 mg by mouth every morning.      prasugrel 10 MG Tabs   Commonly known as: EFFIENT   Take 1 tablet (10 mg total) by mouth daily.      prenatal multivitamin Tabs   Take 1 tablet by mouth daily.      rosuvastatin 20 MG tablet   Commonly known as: CRESTOR   Take 1 tablet (20 mg total) by mouth at bedtime.      triamterene-hydrochlorothiazide 37.5-25 MG per capsule   Commonly known as: DYAZIDE   Take 1 capsule by mouth every morning.            Disposition   Discharge Orders    Future Appointments: Provider: Department: Dept Phone: Center:   04/03/2011 9:00 AM Jonelle Sidle, MD [facility] [phone] [id number]  Future Orders Please Complete By Expires   Diet - low sodium heart healthy      Increase activity slowly      Discharge instructions      Comments:   **PLEASE REMEMBER TO BRING ALL OF YOUR MEDICATIONS TO EACH OF YOUR FOLLOW-UP OFFICE VISITS.  * KEEP WRIST CATHETERIZATION SITE CLEAN AND DRY. Call the office for any signs of bleeding, pus, swelling, increased pain, or any other concerns. * NO HEAVY LIFTING (>10lbs) X 2 WEEKS. * NO SEXUAL ACTIVITY X 2 WEEKS. * NO DRIVING X 1 WEEK. * NO SOAKING BATHS, HOT TUBS, POOLS, ETC., X 7 DAYS.  * Please follow up with your primary care provider regarding your thyroid and cholesterol.     Follow-up Information    Follow up with [facility], [facility]. Schedule an  appointment as soon as possible for a visit in 3 weeks.   Contact information:   [address] [place] [address] [phone]       Follow up with Nona Dell, MD on 04/03/2011. (9:00a)    Contact information:   [facility] [address] [place] [address] [place] [address] [phone]           Outstanding Labs/Studies:  1. Patient will require follow up lipid panel and liver function tests in 6-8 weeks as we changed her statin during this hospitalization  2. She will need f/u TSH in 6-8wks with outpatient follow up  Duration of Discharge Encounter: Greater than 30 minutes including physician and PA time.  Signed, Brennen Gardiner PA-C 03/20/2011, 2:31 PM

## 2011-03-20 NOTE — Progress Notes (Signed)
DC orders received.  Patient stable with no S/S of distress.  Medication and discharge instructions reviewed with patient and family members.  Patient DC home with family. Nolon Nations

## 2011-03-20 NOTE — Progress Notes (Signed)
   CARE MANAGEMENT NOTE 03/20/2011  Patient:  [name], [name]   Account Number:  [id number]  Date Initiated:  03/19/2011  Documentation initiated by:  GRAVES-BIGELOW,Shaniyah Wix  Subjective/Objective Assessment:   Pt admitted with Chest pain. Cardiac cath on 2/15 -post pci 03-18-11. Pt lives at home with husband. PT has been working with pt- recommends HHPT for safety evaluation and treatment.     Action/Plan:   CM spoke to pt and she will benefit from [facility] for disease managment and HH PT. She has used [facility] in the past for services and wants to use them again. Pt has RW and BSC at home.   Anticipated DC Date:  03/21/2011   Anticipated DC Plan:  HOME W HOME HEALTH SERVICES      DC Planning Services  CM consult      [facility] Choice  HOME HEALTH   Choice offered to / List presented to:  C-1 Patient        HH arranged  HH-1 RN  HH-10 DISEASE MANAGEMENT  HH-2 PT      HH agency  [facility]   Status of service:  Completed, signed off Medicare Important Message given?   (If response is "NO", the following Medicare IM given date fields will be blank) Date Medicare IM given:   Date Additional Medicare IM given:    Discharge Disposition:  HOME W HOME HEALTH SERVICES  Per UR Regulation:    Comments:  03-20-11 0921 Tomi Bamberger, RN,BSN [phone] CM will use [facility] for services. CM made referral  and SOC to begin within 24-48 hours post d/c.  03-19-11 1237 Tomi Bamberger, RN,BSN [phone] CM will ask for orders for above services listed. Unsure of d/c date at this time. Will continue to monitor for disposition.

## 2011-03-23 NOTE — Discharge Summary (Signed)
Agree with findings as documented. Please see my office note the same day.

## 2011-04-02 ENCOUNTER — Encounter: Payer: Self-pay | Admitting: Cardiology

## 2011-04-03 ENCOUNTER — Ambulatory Visit (INDEPENDENT_AMBULATORY_CARE_PROVIDER_SITE_OTHER): Payer: 59 | Admitting: Cardiology

## 2011-04-03 ENCOUNTER — Encounter: Payer: Self-pay | Admitting: Cardiology

## 2011-04-03 VITALS — BP 146/82 | HR 93 | Resp 18 | Ht 61.0 in | Wt 309.0 lb

## 2011-04-03 DIAGNOSIS — I10 Essential (primary) hypertension: Secondary | ICD-10-CM

## 2011-04-03 DIAGNOSIS — I2510 Atherosclerotic heart disease of native coronary artery without angina pectoris: Secondary | ICD-10-CM

## 2011-04-03 DIAGNOSIS — E119 Type 2 diabetes mellitus without complications: Secondary | ICD-10-CM

## 2011-04-03 DIAGNOSIS — I2699 Other pulmonary embolism without acute cor pulmonale: Secondary | ICD-10-CM

## 2011-04-03 DIAGNOSIS — E782 Mixed hyperlipidemia: Secondary | ICD-10-CM

## 2011-04-03 NOTE — Assessment & Plan Note (Signed)
Continue Crestor. Recheck FLP and LFT for next visit.

## 2011-04-03 NOTE — Assessment & Plan Note (Signed)
Keep followup with Dr. Hall. 

## 2011-04-03 NOTE — Assessment & Plan Note (Signed)
Last in the 1980's. She is now off Coumadin with recent initiation of DAPT.

## 2011-04-03 NOTE — Patient Instructions (Signed)
**Note De-Identified [facility] Obfuscation** Your physician recommends that you continue on your current medications as directed. Please refer to the Current Medication list given to you today.  Your physician recommends that you return for lab work in: 4 weeks, just before next office visit.  Your physician recommends that you schedule a follow-up appointment in: 4 weeks

## 2011-04-03 NOTE — Assessment & Plan Note (Signed)
Multivessel status post recent complex PCI as outlined. On DAPT, lifelong during planned. No chest pain with recent NSTEMI. We discussed her medications, diet and walking regimen. Followup arranged.

## 2011-04-03 NOTE — Assessment & Plan Note (Signed)
For now continue current medications. Might increase Coreg further at next visit.

## 2011-04-03 NOTE — Progress Notes (Signed)
Clinical Summary [name] is a medically complex 61 y.o.female presenting for post hospital followup. This is my first meeting with her. She had a recent prolonged hospitalization at [facility] during which time she underwent a high-risk complex PCI under general anesthesia in the setting of multivessel CAD with NSTEMI. Her history is noted below.  She was taken off of Coumadin during her hospitalization in light of initiation of DAPT with plan for lifelong therapy. She had had a prior history of recurrent PEs, last in the 1980s however.  Lipid panel from February showed total cholesterol 151, triglycerides 104, HDL 54, LDL 76. She was on Lipitor previously, now Crestor.  She denies any chest pain. Has been doing some walking at home, also has had home health nursing. She denies any unusual bleeding problems on DAPT. Ecchymosis from hospitalization resolving.  We discussed her cardiac status. Also diet and weight loss.   Allergies  Allergen Reactions  . Codeine Shortness Of Breath and Itching  . Penicillins Rash  . Sulfa Antibiotics Rash    Current Outpatient Prescriptions  Medication Sig Dispense Refill  . albuterol (PROVENTIL) (2.5 MG/3ML) 0.083% nebulizer solution Take 2.5 mg by nebulization every 6 (six) hours as needed. For wheezing. Combines with atrovent      . amLODipine (NORVASC) 10 MG tablet Take 1 tablet (10 mg total) by mouth daily.  30 tablet  0  . aspirin EC 81 MG EC tablet Take 1 tablet (81 mg total) by mouth daily.      . carvedilol (COREG) 3.125 MG tablet Take 1 tablet (3.125 mg total) by mouth 2 (two) times daily with a meal.  60 tablet  6  . cetirizine (ZYRTEC) 10 MG tablet Take 10 mg by mouth every morning.       [name] esomeprazole (NEXIUM) 40 MG capsule Take 40 mg by mouth daily before breakfast.       . Ferrous Sulfate (IRON) 325 (65 FE) MG TABS Take 1 tablet by mouth daily after breakfast.       . Fluticasone-Salmeterol (ADVAIR) 250-50 MCG/DOSE AEPB Inhale 1  puff into the lungs 2 (two) times daily.  60 each  0  . glipiZIDE-metformin (METAGLIP) 2.5-500 MG per tablet Take 1 tablet by mouth 2 (two) times daily before a meal. Please hold your Metformin for 48hrs after procedure. Resume on 03/21/11      . HYDROcodone-acetaminophen (NORCO) 5-325 MG per tablet Take 1 tablet by mouth every 6 (six) hours as needed. For pain      . ipratropium (ATROVENT) 0.02 % nebulizer solution Take 500 mcg by nebulization every 6 (six) hours as needed. For shortness of breath. Combines it with Albuterol      . levocetirizine (XYZAL) 5 MG tablet Take 5 mg by mouth every evening.        [name] levothyroxine (SYNTHROID, LEVOTHROID) 150 MCG tablet Take 1 tablet (150 mcg total) by mouth every morning.  30 tablet  1  . meclizine (ANTIVERT) 25 MG tablet Take 25 mg by mouth every morning.       . nitroGLYCERIN (NITROSTAT) 0.4 MG SL tablet Place 1 tablet (0.4 mg total) under the tongue every 5 (five) minutes as needed for chest pain (up to 3 doses).  25 tablet  3  . olmesartan (BENICAR) 40 MG tablet Take 40 mg by mouth every morning.       . prasugrel (EFFIENT) 10 MG TABS Take 1 tablet (10 mg total) by mouth daily.  30 tablet  6  . Prenatal Vit-Fe Fumarate-FA (PRENATAL MULTIVITAMIN) TABS Take 1 tablet by mouth daily.        . rosuvastatin (CRESTOR) 20 MG tablet Take 1 tablet (20 mg total) by mouth at bedtime.  30 tablet  3  . saxagliptin HCl (ONGLYZA) 5 MG TABS tablet Take 5 mg by mouth every morning.       . triamterene-hydrochlorothiazide (DYAZIDE) 37.5-25 MG per capsule Take 1 capsule by mouth every morning.          Past Medical History  Diagnosis Date  . Type 2 diabetes mellitus   . Essential hypertension, benign   . Pulmonary embolism     Recurrent, previously on Coumadin  . Hiatal hernia   . Ovarian cancer   . COPD (chronic obstructive pulmonary disease)   . NSTEMI (non-ST elevated myocardial infarction)   . Coronary atherosclerosis of native coronary artery     Multivessel,  DES distal left main, unsuccessful PCI circumflex 2/13, LVEF 50-55%  . Hypothyroidism   . Mixed hyperlipidemia     Past Surgical History  Procedure Date  . Abdominal surgery   . Cholecystectomy   . Appendectomy   . Cesarean section   . Tonsillectomy   . Breast lumpectomy   . Small intestine surgery 12/2007    Dr [name]  . Closure of abdominal wound/wound vac   . Cataract extraction w/phaco 01/29/2011    Procedure: CATARACT EXTRACTION PHACO AND INTRAOCULAR LENS PLACEMENT (IOC);  Surgeon: [name];  Location: [facility];  Service: Ophthalmology;  Laterality: Left;  CDE: 1.81    Family History  Problem Relation Age of Onset  . Coronary artery disease Father     MI at age 74  . Coronary artery disease Mother     CABG at age 37    Social History [name] reports that she has quit smoking. Her smoking use included Cigarettes. She has never used smokeless tobacco. [name] reports that she does not drink alcohol.  Review of Systems No palpitations or syncope. No melena or hematochezia. Somewhat weak. Otherwise negative.  Physical Examination Filed Vitals:   04/03/11 0859  BP: 146/82  Pulse: 93  Resp: 18   Morbidly obese in NAD. HEENT: Conjunctiva and lids normal, oropharynx clear. Neck: Supple ,increased girth,  no carotid bruits, no thyromegaly. Lungs: Clear to auscultation, nonlabored breathing at rest. Cardiac: Regular rate and rhythm, no S3, soft systolic murmur, no pericardial rub. Abdomen: Protuberent,  nontender, bowel sounds present, no guarding or rebound. Extremities: No pitting edema, distal pulses 2+. Skin: Warm and dry. Resolving ecchymosis. Musculoskeletal: No kyphosis. Neuropsychiatric: Alert and oriented x3, affect grossly appropriate.   ECG Sinus rhythm with NST changes.   Problem List and Plan

## 2011-04-10 ENCOUNTER — Other Ambulatory Visit: Payer: Self-pay

## 2011-04-10 DIAGNOSIS — I10 Essential (primary) hypertension: Secondary | ICD-10-CM

## 2011-04-21 ENCOUNTER — Observation Stay (HOSPITAL_COMMUNITY)
Admission: EM | Admit: 2011-04-21 | Discharge: 2011-04-22 | Disposition: A | Discharge status: Home / Self Care | Payer: 59 | Source: Ambulatory Visit | Attending: Cardiology | Admitting: Cardiology

## 2011-04-21 ENCOUNTER — Emergency Department (HOSPITAL_COMMUNITY): Payer: 59

## 2011-04-21 ENCOUNTER — Inpatient Hospital Stay (HOSPITAL_COMMUNITY): Payer: 59

## 2011-04-21 ENCOUNTER — Encounter (HOSPITAL_COMMUNITY): Payer: Self-pay

## 2011-04-21 ENCOUNTER — Inpatient Hospital Stay: Admit: 2011-04-21 | Payer: Self-pay | Admitting: Cardiology

## 2011-04-21 ENCOUNTER — Other Ambulatory Visit: Payer: Self-pay

## 2011-04-21 DIAGNOSIS — K219 Gastro-esophageal reflux disease without esophagitis: Secondary | ICD-10-CM | POA: Insufficient documentation

## 2011-04-21 DIAGNOSIS — J4489 Other specified chronic obstructive pulmonary disease: Secondary | ICD-10-CM | POA: Insufficient documentation

## 2011-04-21 DIAGNOSIS — I2699 Other pulmonary embolism without acute cor pulmonale: Secondary | ICD-10-CM | POA: Insufficient documentation

## 2011-04-21 DIAGNOSIS — E782 Mixed hyperlipidemia: Secondary | ICD-10-CM | POA: Insufficient documentation

## 2011-04-21 DIAGNOSIS — J449 Chronic obstructive pulmonary disease, unspecified: Secondary | ICD-10-CM | POA: Insufficient documentation

## 2011-04-21 DIAGNOSIS — E663 Overweight: Secondary | ICD-10-CM | POA: Insufficient documentation

## 2011-04-21 DIAGNOSIS — I252 Old myocardial infarction: Secondary | ICD-10-CM | POA: Insufficient documentation

## 2011-04-21 DIAGNOSIS — R079 Chest pain, unspecified: Secondary | ICD-10-CM

## 2011-04-21 DIAGNOSIS — K449 Diaphragmatic hernia without obstruction or gangrene: Secondary | ICD-10-CM | POA: Insufficient documentation

## 2011-04-21 DIAGNOSIS — R0789 Other chest pain: Principal | ICD-10-CM | POA: Insufficient documentation

## 2011-04-21 DIAGNOSIS — C569 Malignant neoplasm of unspecified ovary: Secondary | ICD-10-CM | POA: Insufficient documentation

## 2011-04-21 DIAGNOSIS — Z8543 Personal history of malignant neoplasm of ovary: Secondary | ICD-10-CM | POA: Insufficient documentation

## 2011-04-21 DIAGNOSIS — I2510 Atherosclerotic heart disease of native coronary artery without angina pectoris: Secondary | ICD-10-CM | POA: Insufficient documentation

## 2011-04-21 DIAGNOSIS — I10 Essential (primary) hypertension: Secondary | ICD-10-CM | POA: Insufficient documentation

## 2011-04-21 DIAGNOSIS — Z9861 Coronary angioplasty status: Secondary | ICD-10-CM | POA: Insufficient documentation

## 2011-04-21 DIAGNOSIS — Z86711 Personal history of pulmonary embolism: Secondary | ICD-10-CM | POA: Insufficient documentation

## 2011-04-21 DIAGNOSIS — E119 Type 2 diabetes mellitus without complications: Secondary | ICD-10-CM | POA: Insufficient documentation

## 2011-04-21 DIAGNOSIS — E039 Hypothyroidism, unspecified: Secondary | ICD-10-CM | POA: Insufficient documentation

## 2011-04-21 DIAGNOSIS — R197 Diarrhea, unspecified: Secondary | ICD-10-CM

## 2011-04-21 DIAGNOSIS — IMO0002 Reserved for concepts with insufficient information to code with codable children: Secondary | ICD-10-CM | POA: Insufficient documentation

## 2011-04-21 DIAGNOSIS — R9430 Abnormal result of cardiovascular function study, unspecified: Secondary | ICD-10-CM | POA: Insufficient documentation

## 2011-04-21 DIAGNOSIS — R110 Nausea: Secondary | ICD-10-CM

## 2011-04-21 HISTORY — DX: Overweight: E66.3

## 2011-04-21 LAB — GLUCOSE, CAPILLARY
Glucose-Capillary: 120 mg/dL — ABNORMAL HIGH (ref 70–99)
Glucose-Capillary: 162 mg/dL — ABNORMAL HIGH (ref 70–99)

## 2011-04-21 LAB — DIFFERENTIAL
Basophils Relative: 0 % (ref 0–1)
Monocytes Absolute: 0.5 10*3/uL (ref 0.1–1.0)
Monocytes Relative: 7 % (ref 3–12)
Neutro Abs: 4.6 10*3/uL (ref 1.7–7.7)

## 2011-04-21 LAB — PROTIME-INR: INR: 1.05 (ref 0.00–1.49)

## 2011-04-21 LAB — COMPREHENSIVE METABOLIC PANEL
ALT: 23 U/L (ref 0–35)
AST: 17 U/L (ref 0–37)
Alkaline Phosphatase: 59 U/L (ref 39–117)
CO2: 25 mEq/L (ref 19–32)
Chloride: 101 mEq/L (ref 96–112)
GFR calc non Af Amer: 74 mL/min — ABNORMAL LOW (ref >90)
Sodium: 136 mEq/L (ref 135–145)
Total Bilirubin: 0.3 mg/dL (ref 0.3–1.2)

## 2011-04-21 LAB — CBC
HCT: 36.5 % (ref 36.0–46.0)
Hemoglobin: 12.1 g/dL (ref 12.0–15.0)
MCH: 26.9 pg (ref 26.0–34.0)
MCHC: 33.2 g/dL (ref 30.0–36.0)

## 2011-04-21 LAB — MRSA PCR SCREENING: MRSA by PCR: NEGATIVE

## 2011-04-21 LAB — CARDIAC PANEL(CRET KIN+CKTOT+MB+TROPI): CK, MB: 1.4 ng/mL (ref 0.3–4.0)

## 2011-04-21 LAB — POCT I-STAT TROPONIN I

## 2011-04-21 MED ORDER — ALPRAZOLAM 0.25 MG PO TABS
0.2500 mg | ORAL_TABLET | Freq: Two times a day (BID) | ORAL | Status: DC | PRN
  Administered 2011-04-21: 0.25 mg via ORAL
  Filled 2011-04-21: qty 1

## 2011-04-21 MED ORDER — IPRATROPIUM BROMIDE 0.02 % IN SOLN: 500.0000 ug | Freq: Four times a day (QID) | RESPIRATORY_TRACT | Status: DC | PRN

## 2011-04-21 MED ORDER — HEPARIN (PORCINE) IN NACL 100-0.45 UNIT/ML-% IJ SOLN
16.0000 [IU]/kg/h | INTRAMUSCULAR | Status: DC
  Filled 2011-04-21 (×2): qty 250

## 2011-04-21 MED ORDER — SODIUM CHLORIDE 0.9 % IV SOLN: 250.0000 mL | INTRAVENOUS | Status: DC | PRN

## 2011-04-21 MED ORDER — NITROGLYCERIN IN D5W 200-5 MCG/ML-% IV SOLN
5.0000 ug/min | Freq: Once | INTRAVENOUS | Status: AC
  Administered 2011-04-21: 5 ug/min via INTRAVENOUS
  Filled 2011-04-21: qty 250

## 2011-04-21 MED ORDER — INSULIN ASPART 100 UNIT/ML ~~LOC~~ SOLN: 0.0000 [IU] | Freq: Three times a day (TID) | SUBCUTANEOUS | Status: DC

## 2011-04-21 MED ORDER — NITROGLYCERIN 0.4 MG SL SUBL: 0.4000 mg | SUBLINGUAL_TABLET | SUBLINGUAL | Status: DC | PRN

## 2011-04-21 MED ORDER — PANTOPRAZOLE SODIUM 40 MG PO TBEC
80.0000 mg | DELAYED_RELEASE_TABLET | Freq: Two times a day (BID) | ORAL | Status: DC
  Administered 2011-04-22: 80 mg via ORAL
  Filled 2011-04-21: qty 2

## 2011-04-21 MED ORDER — FERROUS SULFATE 325 (65 FE) MG PO TABS
325.0000 mg | ORAL_TABLET | Freq: Every day | ORAL | Status: DC
  Administered 2011-04-21 – 2011-04-22 (×2): 325 mg via ORAL
  Filled 2011-04-21 (×2): qty 1

## 2011-04-21 MED ORDER — LINAGLIPTIN 5 MG PO TABS
5.0000 mg | ORAL_TABLET | Freq: Every day | ORAL | Status: DC
  Administered 2011-04-21 – 2011-04-22 (×2): 5 mg via ORAL
  Filled 2011-04-21 (×2): qty 1

## 2011-04-21 MED ORDER — HEPARIN (PORCINE) IN NACL 100-0.45 UNIT/ML-% IJ SOLN
1250.0000 [IU]/h | INTRAMUSCULAR | Status: DC
  Administered 2011-04-21 – 2011-04-22 (×2): 1250 [IU]/h via INTRAVENOUS
  Filled 2011-04-21: qty 250

## 2011-04-21 MED ORDER — MECLIZINE HCL 25 MG PO TABS
25.0000 mg | ORAL_TABLET | ORAL | Status: DC
  Administered 2011-04-22: 25 mg via ORAL
  Filled 2011-04-21 (×2): qty 1

## 2011-04-21 MED ORDER — FAMOTIDINE IN NACL 20-0.9 MG/50ML-% IV SOLN
20.0000 mg | Freq: Once | INTRAVENOUS | Status: AC
  Administered 2011-04-21: 20 mg via INTRAVENOUS
  Filled 2011-04-21: qty 50

## 2011-04-21 MED ORDER — ONDANSETRON HCL 4 MG/2ML IJ SOLN
4.0000 mg | Freq: Once | INTRAMUSCULAR | Status: AC
  Administered 2011-04-21: 4 mg via INTRAVENOUS
  Filled 2011-04-21: qty 2

## 2011-04-21 MED ORDER — SODIUM CHLORIDE 0.9 % IJ SOLN: 3.0000 mL | INTRAMUSCULAR | Status: DC | PRN

## 2011-04-21 MED ORDER — ASPIRIN EC 81 MG PO TBEC
81.0000 mg | DELAYED_RELEASE_TABLET | Freq: Every day | ORAL | Status: DC
  Administered 2011-04-22: 81 mg via ORAL
  Filled 2011-04-21 (×2): qty 1

## 2011-04-21 MED ORDER — ZOLPIDEM TARTRATE 5 MG PO TABS: 5.0000 mg | ORAL_TABLET | Freq: Every evening | ORAL | Status: DC | PRN

## 2011-04-21 MED ORDER — TRIAMTERENE-HCTZ 37.5-25 MG PO CAPS
1.0000 | ORAL_CAPSULE | ORAL | Status: DC
  Administered 2011-04-22: 1 via ORAL
  Filled 2011-04-21 (×2): qty 1

## 2011-04-21 MED ORDER — PRASUGREL HCL 10 MG PO TABS
10.0000 mg | ORAL_TABLET | Freq: Every day | ORAL | Status: DC
  Administered 2011-04-21 – 2011-04-22 (×2): 10 mg via ORAL
  Filled 2011-04-21 (×2): qty 1

## 2011-04-21 MED ORDER — ALBUTEROL SULFATE (5 MG/ML) 0.5% IN NEBU: 2.5000 mg | INHALATION_SOLUTION | Freq: Four times a day (QID) | RESPIRATORY_TRACT | Status: DC | PRN

## 2011-04-21 MED ORDER — LEVOTHYROXINE SODIUM 150 MCG PO TABS
150.0000 ug | ORAL_TABLET | ORAL | Status: DC
  Administered 2011-04-22: 150 ug via ORAL
  Filled 2011-04-21 (×2): qty 1

## 2011-04-21 MED ORDER — HYDROCODONE-ACETAMINOPHEN 5-325 MG PO TABS
1.0000 | ORAL_TABLET | Freq: Two times a day (BID) | ORAL | Status: DC
  Administered 2011-04-21 – 2011-04-22 (×2): 1 via ORAL
  Filled 2011-04-21 (×2): qty 1

## 2011-04-21 MED ORDER — PRENATAL MULTIVITAMIN CH
1.0000 | ORAL_TABLET | Freq: Every day | ORAL | Status: DC
  Administered 2011-04-21 – 2011-04-22 (×2): 1 via ORAL
  Filled 2011-04-21 (×4): qty 1

## 2011-04-21 MED ORDER — FLUTICASONE-SALMETEROL 250-50 MCG/DOSE IN AEPB
1.0000 | INHALATION_SPRAY | Freq: Two times a day (BID) | RESPIRATORY_TRACT | Status: DC
  Administered 2011-04-21 – 2011-04-22 (×2): 1 via RESPIRATORY_TRACT
  Filled 2011-04-21: qty 14

## 2011-04-21 MED ORDER — LEVOCETIRIZINE DIHYDROCHLORIDE 5 MG PO TABS: 5.0000 mg | ORAL_TABLET | Freq: Every evening | ORAL | Status: DC

## 2011-04-21 MED ORDER — ATORVASTATIN CALCIUM 40 MG PO TABS
40.0000 mg | ORAL_TABLET | Freq: Every day | ORAL | Status: DC
  Administered 2011-04-21: 40 mg via ORAL
  Filled 2011-04-21 (×2): qty 1

## 2011-04-21 MED ORDER — SODIUM CHLORIDE 0.9 % IJ SOLN
3.0000 mL | Freq: Two times a day (BID) | INTRAMUSCULAR | Status: DC
  Administered 2011-04-21 – 2011-04-22 (×2): 3 mL via INTRAVENOUS

## 2011-04-21 MED ORDER — HEPARIN BOLUS VIA INFUSION
4000.0000 [IU] | Freq: Once | INTRAVENOUS | Status: AC
  Administered 2011-04-21: 4000 [IU] via INTRAVENOUS

## 2011-04-21 MED ORDER — LORATADINE 10 MG PO TABS
10.0000 mg | ORAL_TABLET | Freq: Every day | ORAL | Status: DC
  Administered 2011-04-21 – 2011-04-22 (×2): 10 mg via ORAL
  Filled 2011-04-21 (×2): qty 1

## 2011-04-21 MED ORDER — PANTOPRAZOLE SODIUM 40 MG PO TBEC: 40.0000 mg | DELAYED_RELEASE_TABLET | Freq: Two times a day (BID) | ORAL | Status: DC

## 2011-04-21 MED ORDER — INSULIN ASPART 100 UNIT/ML ~~LOC~~ SOLN: 0.0000 [IU] | Freq: Every day | SUBCUTANEOUS | Status: DC

## 2011-04-21 MED ORDER — AMLODIPINE BESYLATE 10 MG PO TABS
10.0000 mg | ORAL_TABLET | Freq: Every day | ORAL | Status: DC
  Administered 2011-04-21 – 2011-04-22 (×2): 10 mg via ORAL
  Filled 2011-04-21 (×2): qty 1

## 2011-04-21 MED ORDER — GI COCKTAIL ~~LOC~~
30.0000 mL | Freq: Three times a day (TID) | ORAL | Status: DC | PRN
  Administered 2011-04-21 – 2011-04-22 (×2): 30 mL via ORAL
  Filled 2011-04-21 (×2): qty 30

## 2011-04-21 MED ORDER — CARVEDILOL 3.125 MG PO TABS
3.1250 mg | ORAL_TABLET | Freq: Two times a day (BID) | ORAL | Status: DC
  Administered 2011-04-21 – 2011-04-22 (×2): 3.125 mg via ORAL
  Filled 2011-04-21 (×5): qty 1

## 2011-04-21 MED ORDER — ACETAMINOPHEN 325 MG PO TABS: 650.0000 mg | ORAL_TABLET | ORAL | Status: DC | PRN

## 2011-04-21 MED ORDER — IRON 325 (65 FE) MG PO TABS: 1.0000 | ORAL_TABLET | Freq: Every day | ORAL | Status: DC

## 2011-04-21 MED ORDER — ONDANSETRON HCL 4 MG/2ML IJ SOLN: 4.0000 mg | Freq: Four times a day (QID) | INTRAMUSCULAR | Status: DC | PRN

## 2011-04-21 MED ORDER — MORPHINE SULFATE 4 MG/ML IJ SOLN
4.0000 mg | Freq: Once | INTRAMUSCULAR | Status: AC
  Administered 2011-04-21: 4 mg via INTRAVENOUS
  Filled 2011-04-21: qty 1

## 2011-04-21 MED ORDER — IRBESARTAN 300 MG PO TABS
300.0000 mg | ORAL_TABLET | Freq: Every day | ORAL | Status: DC
  Administered 2011-04-21 – 2011-04-22 (×2): 300 mg via ORAL
  Filled 2011-04-21 (×2): qty 1

## 2011-04-21 NOTE — Consult Note (Signed)
ANTICOAGULATION CONSULT NOTE - Initial Consult  Pharmacy Consult for Heparin Indication: chest pain/ACS  Allergies  Allergen Reactions  . Codeine Shortness Of Breath and Itching  . Penicillins Rash  . Sulfa Antibiotics Rash   Patient Measurements: Height: 5\' 1"  (154.9 cm) Weight: 306 lb (138.801 kg) IBW/kg (Calculated) : 47.8  Heparin Dosing Weight: 85kg  Vital Signs: Temp: 98.2 F (36.8 C) (03/26 0942) Temp src: Oral (03/26 0942) BP: 142/77 mmHg (03/26 1041) Pulse Rate: 92  (03/26 1041)  Labs:  [facility] 04/21/11 0958  HGB 12.1  HCT 36.5  PLT 222  APTT 32  LABPROT 13.9  INR 1.05  HEPARINUNFRC --  CREATININE 0.83  CKTOTAL --  CKMB --  TROPONINI --   Estimated Creatinine Clearance: 93.4 ml/min (by C-G formula based on Cr of 0.83).  Medical History: Past Medical History  Diagnosis Date  . Type 2 diabetes mellitus   . Essential hypertension, benign   . Pulmonary embolism     Recurrent, previously on Coumadin  . Hiatal hernia   . Ovarian cancer   . COPD (chronic obstructive pulmonary disease)   . NSTEMI (non-ST elevated myocardial infarction)   . Coronary atherosclerosis of native coronary artery     Multivessel, DES distal left main, unsuccessful PCI circumflex 2/13, LVEF 50-55%  . Hypothyroidism   . Mixed hyperlipidemia    Medications:   (Not in a hospital admission)  Assessment: Obese  Goal of Therapy:  Heparin level 0.3-0.7 units/ml   Plan: Heparin 4000 unit bolus then 16 units/kg/hr Adjusted BW Check heparin level in 6 hours and adjust as needed Heparin level daily CBC per protocol  Valrie Hart A 04/21/2011,11:04 AM

## 2011-04-21 NOTE — ED Provider Notes (Cosign Needed)
History   This chart was scribed for Ward Givens, MD by Brooks Sailors. The patient was seen in room APA02/APA02. Patient's care was started at 0935.   CSN: [id number]  Arrival date & time 04/21/11  5621   First MD Initiated Contact with Patient 04/21/11 [phone]      Chief Complaint  Patient presents with  . Chest Pain    (Consider location/radiation/quality/duration/timing/severity/associated sxs/prior Treatment)  HPI [name] is a 61 y.o. female who presents to the Emergency Department complaining of constant 6/10 chest pain onset at 5 am Has had associated watery diarrhea x4, nausea, that started last night before her chest pain. She has had mild SOB, and belching with the chest pain.. The pain is described as pressure and burning that is located in the lower part of the sternum and radiates to throat. Patient states that pain is similar to that of her heart attack on 03/12/11. Patient also states that she has painful episodes rated 9/10 at its worst. Patient had parital  relief with first NTG but no relief with the next two doses of NTG. Also patient had 324mg  Asprin prior to arrival of ambulance per instructions of 911. Reports associated diarrhea was relieved with use of Imodium. No one else has had diarrhea.    Denies vomiting, diaphoresis, recent sick contacts. Patient with h/o MI, cardiac stent, type 2 diabetes, PE 3x, COPD, ovarian CA, hypothyroidism, HTN and is a former smoker.    PCP Dr. Hughie Closs [facility]  Dr Janace Hoard   Past Medical History  Diagnosis Date  . Type 2 diabetes mellitus   . Essential hypertension, benign   . Pulmonary embolism     Recurrent, previously on Coumadin  . Hiatal hernia   . Ovarian cancer   . COPD (chronic obstructive pulmonary disease)   . NSTEMI (non-ST elevated myocardial infarction)   . Coronary atherosclerosis of native coronary artery     Multivessel, DES distal left main, unsuccessful PCI circumflex 2/13, LVEF 50-55%    . Hypothyroidism   . Mixed hyperlipidemia   . Overweight   . Ejection fraction     EF 50-55%, echo, March 15, 2011, question of mild posterior hypokinesis, technically difficult study despite the use of contrast    Past Surgical History  Procedure Date  . Abdominal surgery   . Cholecystectomy   . Appendectomy   . Cesarean section   . Tonsillectomy   . Breast lumpectomy   . Small intestine surgery 12/2007    Dr Lovell Sheehan  . Closure of abdominal wound/wound vac   . Cataract extraction w/phaco 01/29/2011    Procedure: CATARACT EXTRACTION PHACO AND INTRAOCULAR LENS PLACEMENT (IOC);  Surgeon: Gemma Payor;  Location: [facility];  Service: Ophthalmology;  Laterality: Left;  CDE: 1.81    Family History  Problem Relation Age of Onset  . Coronary artery disease Father     MI at age 40  . Heart attack Father   . Coronary artery disease Mother     CABG at age 33  . Heart attack Mother   . Cirrhosis Sister   . Fibromyalgia Sister   . Osteoporosis Sister     History  Substance Use Topics  . Smoking status: Former Smoker    Types: Cigarettes    Quit date: 01/27/1980  . Smokeless tobacco: Never Used  . Alcohol Use: No  lives at home with spouse  OB History    Grav Para Term Preterm Abortions TAB SAB Ect Mult  Living                  Review of Systems 10 Systems reviewed and are negative for acute change except as noted in the HPI.  Allergies  Codeine; Penicillins; and Sulfa antibiotics  Home Medications   Current Outpatient Rx  Name Route Sig Dispense Refill  . ALBUTEROL SULFATE (2.5 MG/3ML) 0.083% IN NEBU Nebulization Take 2.5 mg by nebulization every 6 (six) hours as needed. For wheezing. Combines with atrovent    . AMLODIPINE BESYLATE 10 MG PO TABS Oral Take 1 tablet (10 mg total) by mouth daily. 30 tablet 0  . ASPIRIN 81 MG PO TBEC Oral Take 1 tablet (81 mg total) by mouth daily.    [name] CARVEDILOL 3.125 MG PO TABS Oral Take 1 tablet (3.125 mg total) by mouth 2 (two) times  daily with a meal. 60 tablet 6  . CETIRIZINE HCL 10 MG PO TABS Oral Take 10 mg by mouth every morning.     [name] ESOMEPRAZOLE MAGNESIUM 40 MG PO CPDR Oral Take 40 mg by mouth daily before breakfast.     . IRON 325 (65 FE) MG PO TABS Oral Take 1 tablet by mouth daily after breakfast.     . FLUTICASONE-SALMETEROL 250-50 MCG/DOSE IN AEPB Inhalation Inhale 1 puff into the lungs 2 (two) times daily. 60 each 0  . GLIPIZIDE-METFORMIN HCL 2.5-500 MG PO TABS Oral Take 1 tablet by mouth 2 (two) times daily before a meal. Please hold your Metformin for 48hrs after procedure. Resume on 03/21/11    . HYDROCODONE-ACETAMINOPHEN 5-325 MG PO TABS Oral Take 1 tablet by mouth 2 (two) times daily. For pain    . IPRATROPIUM BROMIDE 0.02 % IN SOLN Nebulization Take 500 mcg by nebulization every 6 (six) hours as needed. For shortness of breath. Combines it with Albuterol    . LEVOCETIRIZINE DIHYDROCHLORIDE 5 MG PO TABS Oral Take 5 mg by mouth every evening.      [name] LEVOTHYROXINE SODIUM 150 MCG PO TABS Oral Take 1 tablet (150 mcg total) by mouth every morning. 30 tablet 1  . MECLIZINE HCL 25 MG PO TABS Oral Take 25 mg by mouth every morning.     [name] NITROGLYCERIN 0.4 MG SL SUBL Sublingual Place 1 tablet (0.4 mg total) under the tongue every 5 (five) minutes as needed for chest pain (up to 3 doses). 25 tablet 3  . OLMESARTAN MEDOXOMIL 40 MG PO TABS Oral Take 40 mg by mouth every morning.     [name] PRASUGREL HCL 10 MG PO TABS Oral Take 1 tablet (10 mg total) by mouth daily. 30 tablet 6  . PRENATAL MULTIVITAMIN CH Oral Take 1 tablet by mouth daily.      [name] ROSUVASTATIN CALCIUM 20 MG PO TABS Oral Take 1 tablet (20 mg total) by mouth at bedtime. 30 tablet 3  . SAXAGLIPTIN HCL 5 MG PO TABS Oral Take 5 mg by mouth every morning.     . TRIAMTERENE-HCTZ 37.5-25 MG PO CAPS Oral Take 1 capsule by mouth every morning.        BP 142/77  Pulse 92  Temp(Src) 98.2 F (36.8 C) (Oral)  Resp 15  Ht 5\' 1"  (1.549 m)  Wt 306 lb (138.801 kg)  BMI  57.82 kg/m2  SpO2 98%  Drink plenty of fluids (clear liquids) the next 12-24 hours then start the BRAT diet. . Use the zofran for nausea or vomiting. Take imodium OTC for diarrhea. Avoid mild products until  the diarrhea is gone. Recheck if you get worse.   Physical Exam  Nursing note and vitals reviewed. Constitutional: She is oriented to person, place, and time. She appears well-developed and well-nourished. No distress.       Obese  HENT:  Head: Normocephalic and atraumatic.  Right Ear: External ear normal.  Left Ear: External ear normal.  Nose: Nose normal.  Mouth/Throat: Oropharynx is clear and moist.  Eyes: EOM are normal. Pupils are equal, round, and reactive to light.  Neck: Normal range of motion. Neck supple. No tracheal deviation present.  Cardiovascular: Normal rate, regular rhythm and normal heart sounds.  Exam reveals no gallop and no friction rub.   No murmur heard. Pulmonary/Chest: Effort normal. No respiratory distress. She has no wheezes. She has no rales. She exhibits no tenderness.  Abdominal: Soft. Bowel sounds are normal. She exhibits no distension. There is no tenderness. There is no rebound and no guarding.  Musculoskeletal: Normal range of motion. She exhibits no edema.  Neurological: She is alert and oriented to person, place, and time. No sensory deficit.  Skin: Skin is warm and dry. There is pallor.  Psychiatric: She has a normal mood and affect. Her behavior is normal.    ED Course  Procedures (including critical care time)   Medications  nitroGLYCERIN 0.2 mg/mL in dextrose 5 % infusion (10 mcg/min Intravenous Rate/Dose Change 04/21/11 1248)  famotidine (PEPCID) IVPB 20 mg (0 mg Intravenous Stopped 04/21/11 1048)  morphine 4 MG/ML injection 4 mg (4 mg Intravenous Given 04/21/11 1014)  ondansetron (ZOFRAN) injection 4 mg (4 mg Intravenous Given 04/21/11 1014)  heparin bolus via infusion 4,000 Units (4000 Units Intravenous Given 04/21/11 1114)   Review of  her prior chart shows that she had a stent placed under general anesthesia in her left main distal/LAD. They were unable to cannulate her circumflex. Her right cornea artery was not well visualized. At that point she was taken off Coumadin and she been on for 30 years and placed on effient an aspirin   DIAGNOSTIC STUDIES: Oxygen Saturation is 98% on room air, normal by my interpretation.    COORDINATION OF CARE: 9:50AM  Patient informed of current plan for treatment and evaluation and agrees with plan at this time.  11:20AM  Patent says pain is a little better.  11:35AM  Dr Dietrich Pates, states she should go to [facility] to see his partners  11:59 Dr Myrtis Ser accepts in transfer to [facility], no ICU beds available, wants her to go to ED  12:21 [facility] called back stating [facility] has refused to let patient be transported to ED b/o volume there 12:46 Dr Myrtis Ser called back, patient still c/o pain at "5" but her color is good, she is not diaphoretic  And she is talking quietly with her family. Wants a repeat troponin, will have nurse titrate her NTG, still at 5 mcg/min. [facility] states her bed should be ready shortly and the truck is already at [facility].   Results for orders placed during the hospital encounter of 04/21/11  COMPREHENSIVE METABOLIC PANEL      Component Value Range   Sodium 136  135 - 145 (mEq/L)   Potassium 4.1  3.5 - 5.1 (mEq/L)   Chloride 101  96 - 112 (mEq/L)   CO2 25  19 - 32 (mEq/L)   Glucose, Bld 204 (*) 70 - 99 (mg/dL)   BUN 23  6 - 23 (mg/dL)   Creatinine, Ser 1.47  0.50 - 1.10 (mg/dL)   Calcium  9.8  8.4 - 10.5 (mg/dL)   Total Protein 7.2  6.0 - 8.3 (g/dL)   Albumin 3.8  3.5 - 5.2 (g/dL)   AST 17  0 - 37 (U/L)   ALT 23  0 - 35 (U/L)   Alkaline Phosphatase 59  39 - 117 (U/L)   Total Bilirubin 0.3  0.3 - 1.2 (mg/dL)   GFR calc non Af Amer 74 (*) >90 (mL/min)   GFR calc Af Amer 86 (*) >90 (mL/min)  APTT      Component Value Range   aPTT 32  24 - 37 (seconds)  PROTIME-INR      Component Value  Range   Prothrombin Time 13.9  11.6 - 15.2 (seconds)   INR 1.05  0.00 - 1.49   CBC      Component Value Range   WBC 6.8  4.0 - 10.5 (K/uL)   RBC 4.50  3.87 - 5.11 (MIL/uL)   Hemoglobin 12.1  12.0 - 15.0 (g/dL)   HCT 16.1  09.6 - 04.5 (%)   MCV 81.1  78.0 - 100.0 (fL)   MCH 26.9  26.0 - 34.0 (pg)   MCHC 33.2  30.0 - 36.0 (g/dL)   RDW 40.9  81.1 - 91.4 (%)   Platelets 222  150 - 400 (K/uL)  DIFFERENTIAL      Component Value Range   Neutrophils Relative 68  43 - 77 (%)   Neutro Abs 4.6  1.7 - 7.7 (K/uL)   Lymphocytes Relative 24  12 - 46 (%)   Lymphs Abs 1.6  0.7 - 4.0 (K/uL)   Monocytes Relative 7  3 - 12 (%)   Monocytes Absolute 0.5  0.1 - 1.0 (K/uL)   Eosinophils Relative 2  0 - 5 (%)   Eosinophils Absolute 0.1  0.0 - 0.7 (K/uL)   Basophils Relative 0  0 - 1 (%)   Basophils Absolute 0.0  0.0 - 0.1 (K/uL)  LIPASE, BLOOD      Component Value Range   Lipase 26  11 - 59 (U/L)  POCT I-STAT TROPONIN I      Component Value Range   Troponin i, poc 0.00  0.00 - 0.08 (ng/mL)   Comment 3           POCT I-STAT TROPONIN I      Component Value Range   Troponin i, poc 0.01  0.00 - 0.08 (ng/mL)   Comment 3           MRSA PCR SCREENING      Component Value Range   MRSA by PCR NEGATIVE  NEGATIVE   GLUCOSE, CAPILLARY      Component Value Range   Glucose-Capillary 120 (*) 70 - 99 (mg/dL)  CARDIAC PANEL(CRET KIN+CKTOT+MB+TROPI)      Component Value Range   Total CK 45  7 - 177 (U/L)   CK, MB 1.4  0.3 - 4.0 (ng/mL)   Troponin I <0.30  <0.30 (ng/mL)   Relative Index RELATIVE INDEX IS INVALID  0.0 - 2.5    Laboratory interpretation all normal except hyperglycemia   Dg Chest Port 1 View  04/21/2011  *RADIOLOGY REPORT*  Clinical Data: Chest pain.  PORTABLE CHEST - 1 VIEW  Comparison: 03/14/2011  Findings: Cardiomegaly.  Mild vascular congestion without overt edema.  No confluent opacities or effusions.  IMPRESSION: Cardiomegaly.  Mild vascular congestion.  Original Report Authenticated  By: Cyndie Chime, M.D.    Date: 04/21/2011  Rate: 93  Rhythm: normal  sinus rhythm  QRS Axis: normal  Intervals: normal  ST/T Wave abnormalities: normal  Conduction Disutrbances:none  Narrative Interpretation:   Old EKG Reviewed: unchanged from 03/19/2011   1. Chest pain    Transfer to [facility]      CRITICAL CARE Performed by: Devoria Albe L   Total critical care time: 45 minutes   Critical care time was exclusive of separately billable procedures and treating other patients.  Critical care was necessary to treat or prevent imminent or life-threatening deterioration.  Critical care was time spent personally by me on the following activities: development of treatment plan with patient and/or surrogate as well as nursing, discussions with consultants, evaluation of patient's response to treatment, examination of patient, obtaining history from patient or surrogate, ordering and performing treatments and interventions, ordering and review of laboratory studies, ordering and review of radiographic studies, pulse oximetry and re-evaluation of patient's condition.   MDM    I personally performed the services described in this documentation, which was scribed in my presence. The recorded information has been reviewed and considered. Devoria Albe, MD, Armando Gang        Ward Givens, MD 04/21/11 2022

## 2011-04-21 NOTE — ED Notes (Signed)
[facility] called with bed 2904.  They will dispatch a truck ASAP.  Nurse informed.

## 2011-04-21 NOTE — ED Notes (Signed)
Report given to Renae Fickle, [employer]. [facility] at bedside. Patient transferred to [facility].

## 2011-04-21 NOTE — Progress Notes (Signed)
ANTICOAGULATION CONSULT NOTE - Follow Up Consult  Pharmacy Consult for Heparin Indication: chest pain/ACS  Allergies  Allergen Reactions  . Codeine Shortness Of Breath and Itching  . Penicillins Rash  . Sulfa Antibiotics Rash    Patient Measurements: Height: 5\' 1"  (154.9 cm) Weight: 306 lb (138.801 kg) IBW/kg (Calculated) : 47.8  Heparin Dosing Weight: 83.6 kg  Vital Signs: Temp: 98.6 F (37 C) (03/26 1949) Temp src: Oral (03/26 1654) BP: 148/67 mmHg (03/26 1949) Pulse Rate: 79  (03/26 1949)  Labs:  Basename 04/21/11 2000 04/21/11 1810 04/21/11 0958  HGB -- -- 12.1  HCT -- -- 36.5  PLT -- -- 222  APTT -- -- 32  LABPROT -- -- 13.9  INR -- -- 1.05  HEPARINUNFRC 0.75* -- --  CREATININE -- -- 0.83  CKTOTAL -- 45 --  CKMB -- 1.4 --  TROPONINI -- <0.30 --   Estimated Creatinine Clearance: 93.4 ml/min (by C-G formula based on Cr of 0.83).   Medications:  Infusions:    . heparin 16 Units/kg/hr (04/21/11 1700)    Assessment: Chest Pain:  Heparin level is slightly supratherapeutic.  Confirmed that pump is infusing at 1350 units/hr.  Goal of Therapy:  Heparin level 0.3-0.7 units/ml   Plan:  Decrease Heparin to 1250 units/hr. Check Heparin level and CBC 6 hours after rate decrease.  Estella Husk, Pharm.D., BCPS Clinical Pharmacist  Pager [phone] 04/21/2011, 8:46 PM

## 2011-04-21 NOTE — ED Notes (Signed)
EMS reports pt c/o cp and epigastric pain around 0200 this morning.  Pt took 2 nitro prior to ems arrival without relief.  EMS administered a 3rd nitro but no relief.  Pt took 4baby asa.  Pt c/o belching and burning sensation in throat and mouth.

## 2011-04-21 NOTE — ED Notes (Signed)
Report given to Jackson, RN unit 2900. [facility] at bedside.

## 2011-04-21 NOTE — ED Notes (Signed)
Patient left ED at this time with [facility]. NAD noted upon departure.  

## 2011-04-21 NOTE — H&P (Signed)
Physician  H & P  Patient ID: [name] MRN: [id number] DOB/AGE: 63-01-1950 63 y.o. Admit date: 04/21/2011  Primary Care Physician:HALL,ZACK, MD, MD Primary Cardiologist  Simona Huh  HPI:   The patient has known significant coronary disease. In February, 2013, she had a non-STEMI. It was ultimately decided that the best approach to her disease was to proceed with stenting of the left main. This was done and there was good flow from the left main to the LAD. The circumflex could never be opened completely . Two-dimensional echo at that time was technically difficult despite contrast. Ejection fraction was 50-55%. There was suggestion of some inferolateral hypokinesis. The patient describes having had chest pain and radiation to the jaw at that time. She has been stable without exertional symptoms since then.  It is of note that the patient had small bowel obstruction in 2009. Part of her small bowel was removed. There was an incisional hernia that was repaired. There was dehiscence and this was repaired again. The patient also has GERD.She also had some nausea.  On the evening of March 25, the patient had belching after eating dinner. There was nothing unusual about the food that she ate. She had persistent belching and then she had significant watery diarrhea. After that she had some chest discomfort. There was no radiation to the jaw. After she had the chest discomfort during the night she came to the emergency room at [facility]. Her initial troponin was normal. However she continued to have some chest discomfort. She was therefore transferred to [place].  Review of systems: Patient denies fever, chills, headache, sweats, rash, change in vision, change in hearing, cough, urinary symptoms.All other systems are reviewed and are negative other than the history of present illness.   Past Medical History  Diagnosis Date  . Type 2 diabetes mellitus   . Essential  hypertension, benign   . Pulmonary embolism     Recurrent, previously on Coumadin  . Hiatal hernia   . Ovarian cancer   . COPD (chronic obstructive pulmonary disease)   . NSTEMI (non-ST elevated myocardial infarction)   . Coronary atherosclerosis of native coronary artery     Multivessel, DES distal left main, unsuccessful PCI circumflex 2/13, LVEF 50-55%  . Hypothyroidism   . Mixed hyperlipidemia   . Overweight   . Ejection fraction     EF 50-55%, echo, March 15, 2011, question of mild posterior hypokinesis, technically difficult study despite the use of contrast    Family History  Problem Relation Age of Onset  . Coronary artery disease Father     MI at age 28  . Heart attack Father   . Coronary artery disease Mother     CABG at age 87  . Heart attack Mother   . Cirrhosis Sister   . Fibromyalgia Sister   . Osteoporosis Sister     History   Social History  . Marital Status: Married    Spouse Name: N/A    Number of Children: N/A  . Years of Education: N/A   Occupational History  . Not on file.   Social History Main Topics  . Smoking status: Former Smoker    Types: Cigarettes    Quit date: 01/27/1980  . Smokeless tobacco: Never Used  . Alcohol Use: No  . Drug Use: No  . Sexually Active: Not on file   Other Topics Concern  . Not on file   Social History Narrative  .  No narrative on file    Past Surgical History  Procedure Date  . Abdominal surgery   . Cholecystectomy   . Appendectomy   . Cesarean section   . Tonsillectomy   . Breast lumpectomy   . Small intestine surgery 12/2007    Dr Lovell Sheehan  . Closure of abdominal wound/wound vac   . Cataract extraction w/phaco 01/29/2011    Procedure: CATARACT EXTRACTION PHACO AND INTRAOCULAR LENS PLACEMENT (IOC);  Surgeon: Gemma Payor;  Location: [facility];  Service: Ophthalmology;  Laterality: Left;  CDE: 1.81     Prescriptions prior to admission  Medication Sig Dispense Refill  . albuterol (PROVENTIL) (2.5  MG/3ML) 0.083% nebulizer solution Take 2.5 mg by nebulization every 6 (six) hours as needed. For wheezing. Combines with atrovent      . amLODipine (NORVASC) 10 MG tablet Take 1 tablet (10 mg total) by mouth daily.  30 tablet  0  . aspirin EC 81 MG EC tablet Take 1 tablet (81 mg total) by mouth daily.      . carvedilol (COREG) 3.125 MG tablet Take 1 tablet (3.125 mg total) by mouth 2 (two) times daily with a meal.  60 tablet  6  . cetirizine (ZYRTEC) 10 MG tablet Take 10 mg by mouth every morning.       [name] esomeprazole (NEXIUM) 40 MG capsule Take 40 mg by mouth daily before breakfast.       . Ferrous Sulfate (IRON) 325 (65 FE) MG TABS Take 1 tablet by mouth daily after breakfast.       . Fluticasone-Salmeterol (ADVAIR) 250-50 MCG/DOSE AEPB Inhale 1 puff into the lungs 2 (two) times daily.  60 each  0  . glipiZIDE-metformin (METAGLIP) 2.5-500 MG per tablet Take 1 tablet by mouth 2 (two) times daily before a meal. Please hold your Metformin for 48hrs after procedure. Resume on 03/21/11      . HYDROcodone-acetaminophen (NORCO) 5-325 MG per tablet Take 1 tablet by mouth 2 (two) times daily. For pain      . ipratropium (ATROVENT) 0.02 % nebulizer solution Take 500 mcg by nebulization every 6 (six) hours as needed. For shortness of breath. Combines it with Albuterol      . levocetirizine (XYZAL) 5 MG tablet Take 5 mg by mouth every evening.        [name] levothyroxine (SYNTHROID, LEVOTHROID) 150 MCG tablet Take 1 tablet (150 mcg total) by mouth every morning.  30 tablet  1  . meclizine (ANTIVERT) 25 MG tablet Take 25 mg by mouth every morning.       . nitroGLYCERIN (NITROSTAT) 0.4 MG SL tablet Place 1 tablet (0.4 mg total) under the tongue every 5 (five) minutes as needed for chest pain (up to 3 doses).  25 tablet  3  . olmesartan (BENICAR) 40 MG tablet Take 40 mg by mouth every morning.       . prasugrel (EFFIENT) 10 MG TABS Take 1 tablet (10 mg total) by mouth daily.  30 tablet  6  . Prenatal Vit-Fe  Fumarate-FA (PRENATAL MULTIVITAMIN) TABS Take 1 tablet by mouth daily.        . rosuvastatin (CRESTOR) 20 MG tablet Take 1 tablet (20 mg total) by mouth at bedtime.  30 tablet  3  . saxagliptin HCl (ONGLYZA) 5 MG TABS tablet Take 5 mg by mouth every morning.       . triamterene-hydrochlorothiazide (DYAZIDE) 37.5-25 MG per capsule Take 1 capsule by mouth every morning.  Physical Exam: Blood pressure 148/75, pulse 87, temperature 98.1 F (36.7 C), temperature source Oral, resp. rate 15, height 5\' 1"  (1.549 m), weight 306 lb (138.801 kg), SpO2 96.00%.  Patient is significantly overweight. She is oriented to person time and place. Affect is normal. She has family members here with her. Head is atraumatic. There is no jugulovenous distention. There no carotid bruits. Lungs are clear. Respiratory effort is nonlabored. Cardiac exam reveals an S1 and S2. There no clicks or significant murmurs. Her abdomen is soft but there is mild tenderness to palpation. There is no rebound. There is no significant peripheral edema. There are no major musculoskeletal deformities. There are no skin rashes.   Labs:   Lab Results  Component Value Date   WBC 6.8 04/21/2011   HGB 12.1 04/21/2011   HCT 36.5 04/21/2011   MCV 81.1 04/21/2011   PLT 222 04/21/2011    Lab 04/21/11 0958  NA 136  K 4.1  CL 101  CO2 25  BUN 23  CREATININE 0.83  CALCIUM 9.8  PROT 7.2  BILITOT 0.3  ALKPHOS 59  ALT 23  AST 17  GLUCOSE 204*         No results found for this basename: LDLDIRECT      Radiology: Dg Chest Port 1 View  04/21/2011  *RADIOLOGY REPORT*  Clinical Data: Chest pain.  PORTABLE CHEST - 1 VIEW  Comparison: 03/14/2011  Findings: Cardiomegaly.  Mild vascular congestion without overt edema.  No confluent opacities or effusions.  IMPRESSION: Cardiomegaly.  Mild vascular congestion.  Original Report Authenticated By: Cyndie Chime, M.D.   EKG: I have personally reviewed EKGs. There sinus rhythm. There are  nonspecific ST-T wave changes. There is no significant change since the EKGs of February, 2013.  ASSESSMENT AND PLAN:     *Chest pain     The patient was transferred here today because of chest discomfort. She has 2 screening point-of-care troponins from the emergency room that are normal. We will obtain more troponins. At this point I'm not convinced that her symptoms are cardiac. She did say that she thought the chest pain was very similar to her pain in February, 2013. However she did not have the association radiated to the jaw. In addition she currently has some GI symptoms. We will watch her cardiac status carefully and obtain more cardiac enzymes.   Type 2 diabetes mellitus    This will be managed with the appropriate medications.   Mixed hyperlipidemia   She'll be continued on her medicines for this.   Coronary atherosclerosis of native coronary artery    Her coronary intervention in February, 2013 was very complex. She was electively intubated for this procedure. There was a stent placed to the left main and LAD successfully. Her circumflex could not be Open well. She did not have significant enzyme elevation from this procedure.   Essential hypertension, benign   Hypothyroidism   Thyroid functions will be checked.   Overweight    Patient is markedly overweight and this is an ongoing problem.   Diarrhea  Nausea    The patient had belching and then nausea and diarrhea. She has some very mild abdominal discomfort. She does have a history of small bowel obstruction in 2009. Some of her history sounds like GERD. We will place her on aggressive management of GERD and watch her GI function very carefully. This may turn out to be the major issue.   Ejection fraction   The patient's ejection  fraction was 50-55% in February, 2013. Her echo by history is technically quite difficult. There probably is an inferolateral wall motion abnormality.       Signed: Willa Rough 04/21/2011,  5:23 PM

## 2011-04-22 LAB — HEMOGLOBIN A1C: Mean Plasma Glucose: 134 mg/dL — ABNORMAL HIGH (ref <117)

## 2011-04-22 LAB — GLUCOSE, CAPILLARY: Glucose-Capillary: 113 mg/dL — ABNORMAL HIGH (ref 70–99)

## 2011-04-22 LAB — BASIC METABOLIC PANEL
CO2: 26 mEq/L (ref 19–32)
Chloride: 103 mEq/L (ref 96–112)
Creatinine, Ser: 0.75 mg/dL (ref 0.50–1.10)
GFR calc Af Amer: >90 mL/min (ref >90)
Potassium: 3.9 mEq/L (ref 3.5–5.1)

## 2011-04-22 LAB — CBC
HCT: 38.1 % (ref 36.0–46.0)
Hemoglobin: 12.4 g/dL (ref 12.0–15.0)
MCV: 81.9 fL (ref 78.0–100.0)
RDW: 15.2 % (ref 11.5–15.5)
WBC: 7.9 10*3/uL (ref 4.0–10.5)

## 2011-04-22 LAB — CARDIAC PANEL(CRET KIN+CKTOT+MB+TROPI)
CK, MB: 1.4 ng/mL (ref 0.3–4.0)
CK, MB: 1.5 ng/mL (ref 0.3–4.0)
Total CK: 44 U/L (ref 7–177)
Troponin I: <0.3 ng/mL (ref <0.30)
Troponin I: <0.3 ng/mL (ref <0.30)

## 2011-04-22 LAB — HEPARIN LEVEL (UNFRACTIONATED): Heparin Unfractionated: 0.6 IU/mL (ref 0.30–0.70)

## 2011-04-22 MED ORDER — PANTOPRAZOLE SODIUM 40 MG PO TBEC: 80.0000 mg | DELAYED_RELEASE_TABLET | Freq: Two times a day (BID) | ORAL | Status: DC

## 2011-04-22 MED ORDER — ATORVASTATIN CALCIUM 40 MG PO TABS: 40.0000 mg | ORAL_TABLET | Freq: Every day | ORAL | Status: DC

## 2011-04-22 NOTE — Discharge Instructions (Signed)
PLEASE REMEMBER TO BRING ALL OF YOUR MEDICATIONS TO EACH OF YOUR FOLLOW-UP OFFICE VISITS.  PLEASE FOLLOW-UP AS PREVIOUSLY SCHEDULED.

## 2011-04-22 NOTE — Progress Notes (Signed)
Pt education complete, IV removed from Left hand. Pt belongings returned to patient, pt escorted to car in a wheelchair by a NT. Discharge complete.

## 2011-04-22 NOTE — Progress Notes (Signed)
ANTICOAGULATION CONSULT NOTE - Follow Up Consult  Pharmacy Consult for Heparin Indication: chest pain/ACS  Allergies  Allergen Reactions  . Codeine Shortness Of Breath and Itching  . Penicillins Rash  . Sulfa Antibiotics Rash    Patient Measurements: Height: 5\' 1"  (154.9 cm) Weight: 309 lb 15.5 oz (140.6 kg) IBW/kg (Calculated) : 47.8  Heparin Dosing Weight: 83.6 kg  Vital Signs: Temp: 98.1 F (36.7 C) (03/27 0730) Temp src: Oral (03/27 0730) BP: 141/67 mmHg (03/27 0700) Pulse Rate: 63  (03/27 0700)  Labs:  Basename 04/22/11 0600 04/22/11 0258 04/21/11 2355 04/21/11 2000 04/21/11 1810 04/21/11 0958  HGB -- 12.4 -- -- -- 12.1  HCT -- 38.1 -- -- -- 36.5  PLT -- 234 -- -- -- 222  APTT -- -- -- -- -- 32  LABPROT -- -- -- -- -- 13.9  INR -- -- -- -- -- 1.05  HEPARINUNFRC -- 0.60 -- 0.75* -- --  CREATININE -- 0.75 -- -- -- 0.83  CKTOTAL 44 -- 52 -- 45 --  CKMB 1.4 -- 1.5 -- 1.4 --  TROPONINI <0.30 -- <0.30 -- <0.30 --   Estimated Creatinine Clearance: 97.7 ml/min (by C-G formula based on Cr of 0.75).   Medications:  Infusions:     . heparin 1,250 Units/hr (04/22/11 0149)  . DISCONTD: heparin 16 Units/kg/hr (04/21/11 1700)    Assessment: 61 yo female with Chest Pain on heparin level at at goal on .   1250 units/hr.  Goal of Therapy:  Heparin level 0.3-0.7 units/ml   Plan:  -Continue heparin at same rate  Harland German, Pharm D 04/22/2011 7:51 AM

## 2011-04-22 NOTE — Progress Notes (Signed)
Subjective:  No further chest pain. EKG shows no ischemia. Rhythm stable. Enzymes normal.  Objective:  Vital Signs in the last 24 hours: Temp:  [97.7 F (36.5 C)-98.6 F (37 C)] 98.1 F (36.7 C) (03/27 0730) Pulse Rate:  [60-97] 63  (03/27 0700) Resp:  [12-25] 17  (03/27 0700) BP: (100-160)/(53-82) 141/67 mmHg (03/27 0700) SpO2:  [92 %-100 %] 95 % (03/27 0700) Weight:  [138.801 kg (306 lb)-140.6 kg (309 lb 15.5 oz)] 140.6 kg (309 lb 15.5 oz) (03/27 0459)  Intake/Output from previous day: 03/26 0701 - 03/27 0700 In: 969.7 [P.O.:580; I.V.:389.7] Out: 750 [Urine:750] Intake/Output from this shift: Total I/O In: -  Out: 400 [Urine:400]     . amLODipine  10 mg Oral Daily  . aspirin EC  81 mg Oral Daily  . atorvastatin  40 mg Oral q1800  . carvedilol  3.125 mg Oral BID WC  . famotidine  20 mg Intravenous Once  . ferrous sulfate  325 mg Oral Daily  . Fluticasone-Salmeterol  1 puff Inhalation BID  . heparin  4,000 Units Intravenous Once  . HYDROcodone-acetaminophen  1 tablet Oral BID  . insulin aspart  0-15 Units Subcutaneous TID WC  . insulin aspart  0-5 Units Subcutaneous QHS  . irbesartan  300 mg Oral Daily  . levothyroxine  150 mcg Oral BH-q7a  . linagliptin  5 mg Oral Daily  . loratadine  10 mg Oral Daily  . meclizine  25 mg Oral BH-q7a  . morphine  4 mg Intravenous Once  . nitroGLYCERIN  5-200 mcg/min Intravenous Once  . ondansetron (ZOFRAN) IV  4 mg Intravenous Once  . pantoprazole  80 mg Oral BID AC  . prasugrel  10 mg Oral Daily  . prenatal multivitamin  1 tablet Oral Daily  . sodium chloride  3 mL Intravenous Q12H  . triamterene-hydrochlorothiazide  1 each Oral BH-q7a  . DISCONTD: Iron  1 tablet Oral QPC breakfast  . DISCONTD: levocetirizine  5 mg Oral QPM  . DISCONTD: pantoprazole  40 mg Oral BID AC      . heparin 1,250 Units/hr (04/22/11 0149)  . DISCONTD: heparin 16 Units/kg/hr (04/21/11 1700)    Physical Exam: The patient appears to be in no  distress.  Head and neck exam reveals that the pupils are equal and reactive.  The extraocular movements are full.  There is no scleral icterus.  Mouth and pharynx are benign.  No lymphadenopathy.  No carotid bruits.  The jugular venous pressure is normal.  Thyroid is not enlarged or tender.  Chest is clear to percussion and auscultation.  No rales or rhonchi.  Expansion of the chest is symmetrical.  Heart reveals no abnormal lift or heave.  First and second heart sounds are normal.  There is no murmur gallop rub or click.  The abdomen is soft and nontender. Marked obesity.  Bowel sounds are normoactive.  There is no hepatosplenomegaly or mass.  There are no abdominal bruits.  Extremities reveal no phlebitis or edema.  Pedal pulses are good.  There is no cyanosis or clubbing.  Neurologic exam is normal strength and no lateralizing weakness.  No sensory deficits.  Integument reveals no rash  Lab Results:  Basename 04/22/11 0258 04/21/11 0958  WBC 7.9 6.8  HGB 12.4 12.1  PLT 234 222    Basename 04/22/11 0258 04/21/11 0958  NA 140 136  K 3.9 4.1  CL 103 101  CO2 26 25  GLUCOSE 109* 204*  BUN 14  23  CREATININE 0.75 0.83    Basename 04/22/11 0600 04/21/11 2355  TROPONINI <0.30 <0.30   Hepatic Function Panel  Basename 04/21/11 0958  PROT 7.2  ALBUMIN 3.8  AST 17  ALT 23  ALKPHOS 59  BILITOT 0.3  BILIDIR --  IBILI --   No results found for this basename: CHOL in the last 72 hours No results found for this basename: PROTIME in the last 72 hours  Imaging: Dg Abd 1 View  04/21/2011  *RADIOLOGY REPORT*  Clinical Data: Abdominal pain.  Diarrhea.  Small bowel obstruction.  ABDOMEN - 1 VIEW  Comparison: 12/24/2007  Findings: Small bowel and colonic gas is seen, without evidence of dilated bowel loops.  Surgical clips seen from prior cholecystectomy.  No definite radiopaque calculi identified.  IMPRESSION: Nonspecific, nonobstructive bowel gas pattern.  Original Report  Authenticated By: Danae Orleans, M.D.   Dg Chest Port 1 View  04/21/2011  *RADIOLOGY REPORT*  Clinical Data: Chest pain.  PORTABLE CHEST - 1 VIEW  Comparison: 03/14/2011  Findings: Cardiomegaly.  Mild vascular congestion without overt edema.  No confluent opacities or effusions.  IMPRESSION: Cardiomegaly.  Mild vascular congestion.  Original Report Authenticated By: Cyndie Chime, M.D.    Cardiac Studies:  Assessment/Plan:  Patient Active Hospital Problem List: Chest pain (04/21/2011)   Assessment: No evidence of MI   Plan: Continue same meds Type 2 diabetes mellitus (08/08/2010)   Assessment:Blood sugars improving   Plan: Continue same meds, strict diet and must lose weight. Mixed hyperlipidemia (08/08/2010)   Assessment: Continue Lipitor GERD   Suspect this is the cause of her chest pain this time.  Will send her out on higher dose of PPI.  Consider Dexilant if she fails to respond to Protonix. Okay for discharge today and she already has appt with Dr. Dionicia Abler on 05/01/11    LOS: 1 day    Cassell Clement 04/22/2011, 7:45 AM

## 2011-04-22 NOTE — Discharge Summary (Signed)
Discharge Summary   Patient ID: [name],  MRN: [id number], DOB/AGE: January 24, 1951 61 y.o.  Admit date: 04/21/2011 Discharge date: 04/22/2011  Discharge Diagnoses Principal Problem:  *Chest pain Active Problems:  Type 2 diabetes mellitus  Mixed hyperlipidemia  Coronary atherosclerosis of native coronary artery  Essential hypertension, benign  Hypothyroidism  Overweight  Diarrhea  Nausea  Ejection fraction   Allergies Allergies  Allergen Reactions  . Codeine Shortness Of Breath and Itching  . Penicillins Rash  . Sulfa Antibiotics Rash    Diagnostic Studies/Procedures  PORTABLE CHEST X-RAY- 04/21/11  Comparison: 03/14/2011  Findings: Cardiomegaly. Mild vascular congestion without overt  edema. No confluent opacities or effusions.  IMPRESSION:  Cardiomegaly. Mild vascular congestion.  ABDOMINAL X-RAY- 04/21/11  Comparison: 12/24/2007  Findings: Small bowel and colonic gas is seen, without evidence of  dilated bowel loops. Surgical clips seen from prior  cholecystectomy. No definite radiopaque calculi identified.  IMPRESSION:  Nonspecific, nonobstructive bowel gas pattern.  History of Present Illness  [name] is a 61yo female with PMHx significant for CAD (multivessel s/p DES-distal left main 02/13, unsuccessful PCI LCx, LVEF 50-55%), history of PE, COPD, type 2 DM, HTN, HL, hypothyroidism, history of SBO (see HPI) and GERD who was admitted to [facility] on 04/21/11 with chest pain.   The evening of 04/20/11, the patient reported belching after eating dinner. She denied consuming a particular food that would have exacerbated reflux symptoms. She endorsed persistent belching and later developed watery diarrhea. It was noted that the patient had a SBO in 2009 with a portion of her small bowel removed as a result. This was complicated by an incisional hernia which was repaired, which dehisced and was subsequently repaired again. Afterwards, she  developed chest discomfort without radiation which persisted during the night prompting her to present to [facility] ED. She related this to her prior NSTEMI. There, initial troponin was normal. She continued to complain of chest discomfort and was therefore transferred to [facility] hospital.   After arrival, POC TnI were found to be negative x 2. EKG without evidence of ischemia. CXR revealed cardiomegaly, mild vascular congestion without overt edema, opacities or effusions. Abdominal x-ray revealed nonspecific findings- nonobstructive bowel gas pattern. Abdominal x-ray revealing no evidence of obstruction. CBC and BMET WNL aside from a mildly elevated BG and borderline decreased GFR. TSH WNL. VSS. There was a low-suspicion for PE.  Hospital Course   This was felt to be noncardiac, however she was subsequently admitted to stepdown for ACS rule-out. She was continued on her outpatient meds and heparin was started. PPI was increased. SSI was added for glucose coverage. Her chest pain improved.   Overnight, she was asymptomatic without events on EKG or telemetry. Caridac biomarkers returned normal x 3 total. She is stable, in good condition and will be discharged home today. Her chest pain was believed to be secondary to GERD. PPI was increased this admission and will be continued on discharge. The recommendation was suggested to pursue a trial of Dexilent if chest pain fails to improve with Protonix. She will continue Lipitor in place of Crestor per Dr. Yevonne Pax rounding note this AM. She will continue all other outpatient meds. She will follow-up with Dr. Diona Browner as previously scheduled. This information has been clearly outlined in the discharge AVS.   Discharge Vitals:  Blood pressure 141/67, pulse 63, temperature 98.1 F (36.7 C), temperature source Oral, resp. rate 17, height 5\' 1"  (1.549 m), weight 140.6 kg (309 lb 15.5 oz),  SpO2 98.00%.   Labs: Recent Labs  Basename 04/22/11 0258 04/21/11 0958   WBC 7.9  6.8   HGB 12.4 12.1   HCT 38.1 36.5   MCV 81.9 81.1   PLT 234 222    Lab 04/22/11 0258 04/21/11 0958  NA 140 136  K 3.9 4.1  CL 103 101  CO2 26 25  BUN 14 23  CREATININE 0.75 0.83  CALCIUM 9.6 9.8  PROT -- 7.2  BILITOT -- 0.3  ALKPHOS -- 59  ALT -- 23  AST -- 17  AMYLASE -- --  LIPASE -- 26  GLUCOSE 109* 204*    Recent Labs  Basename 04/22/11 0600 04/21/11 2355 04/21/11 1810   CKTOTAL 44 52 45   CKMB 1.4 1.5 1.4   CKMBINDEX -- -- --   TROPONINI <0.30 <0.30 <0.30    Basename 04/21/11 1802  TSH 0.965  T4TOTAL --  T3FREE --  THYROIDAB --    Disposition:  Discharge Orders    Future Appointments: Provider: Department: Dept Phone: Center:   05/01/2011 2:40 PM Jonelle Sidle, MD [facility] [phone] [id number]     Follow-up Information    Follow up with Nona Dell, MD on 05/01/2011. (As scheduled. )    Contact information:   [address] [place] [address] [phone]         Discharge Medications:  Medication List  As of 04/22/2011  8:59 AM   START taking these medications         atorvastatin 40 MG tablet   Commonly known as: LIPITOR   Take 1 tablet (40 mg total) by mouth daily at 6 PM.      pantoprazole 40 MG tablet   Commonly known as: PROTONIX   Take 2 tablets (80 mg total) by mouth 2 (two) times daily before a meal.         CONTINUE taking these medications         albuterol (2.5 MG/3ML) 0.083% nebulizer solution   Commonly known as: PROVENTIL      amLODipine 10 MG tablet   Commonly known as: NORVASC   Take 1 tablet (10 mg total) by mouth daily.      aspirin 81 MG EC tablet   Take 1 tablet (81 mg total) by mouth daily.      carvedilol 3.125 MG tablet   Commonly known as: COREG   Take 1 tablet (3.125 mg total) by mouth 2 (two) times daily with a meal.      cetirizine 10 MG tablet   Commonly known as: ZYRTEC      Fluticasone-Salmeterol 250-50 MCG/DOSE Aepb   Commonly known as: ADVAIR   Inhale 1  puff into the lungs 2 (two) times daily.      glipiZIDE-metformin 2.5-500 MG per tablet   Commonly known as: METAGLIP   Take 1 tablet by mouth 2 (two) times daily before a meal. Please hold your Metformin for 48hrs after procedure. Resume on 03/21/11      HYDROcodone-acetaminophen 5-325 MG per tablet   Commonly known as: NORCO      ipratropium 0.02 % nebulizer solution   Commonly known as: ATROVENT      Iron 325 (65 FE) MG Tabs      levocetirizine 5 MG tablet   Commonly known as: XYZAL      levothyroxine 150 MCG tablet   Commonly known as: SYNTHROID, LEVOTHROID   Take 1 tablet (150 mcg total) by mouth every morning.  meclizine 25 MG tablet   Commonly known as: ANTIVERT      nitroGLYCERIN 0.4 MG SL tablet   Commonly known as: NITROSTAT   Place 1 tablet (0.4 mg total) under the tongue every 5 (five) minutes as needed for chest pain (up to 3 doses).      olmesartan 40 MG tablet   Commonly known as: BENICAR      ONGLYZA 5 MG Tabs tablet   Generic drug: saxagliptin HCl      prasugrel 10 MG Tabs   Commonly known as: EFFIENT   Take 1 tablet (10 mg total) by mouth daily.      prenatal multivitamin Tabs      triamterene-hydrochlorothiazide 37.5-25 MG per capsule   Commonly known as: DYAZIDE         STOP taking these medications         esomeprazole 40 MG capsule      rosuvastatin 20 MG tablet          Where to get your medications    These are the prescriptions that you need to pick up. We sent them to a specific pharmacy, so you will need to go there to get them.   [id number] - [place], [place] - [address] AT [facility]    [address] [place] [address]    Phone: [phone]        atorvastatin 40 MG tablet   pantoprazole 40 MG tablet           Outstanding Labs/Studies: None  Duration of Discharge Encounter: Greater than 30 minutes including physician time.  Signed, R. Hurman Horn, PA-C 04/22/2011, 8:59 AM

## 2011-04-30 LAB — CBC WITH DIFFERENTIAL/PLATELET
Eosinophils Absolute: 0.2 10*3/uL (ref 0.0–0.7)
Eosinophils Relative: 2 % (ref 0–5)
HCT: 42.6 % (ref 36.0–46.0)
Hemoglobin: 13.3 g/dL (ref 12.0–15.0)
Lymphocytes Relative: 30 % (ref 12–46)
Lymphs Abs: 2.5 10*3/uL (ref 0.7–4.0)
MCH: 26.3 pg (ref 26.0–34.0)
MCV: 84.2 fL (ref 78.0–100.0)
Monocytes Relative: 9 % (ref 3–12)
RBC: 5.06 MIL/uL (ref 3.87–5.11)
WBC: 8.4 10*3/uL (ref 4.0–10.5)

## 2011-04-30 LAB — HEPATIC FUNCTION PANEL
AST: 22 U/L (ref 0–37)
Albumin: 4.3 g/dL (ref 3.5–5.2)
Alkaline Phosphatase: 60 U/L (ref 39–117)
Indirect Bilirubin: 0.5 mg/dL (ref 0.0–0.9)
Total Protein: 7.2 g/dL (ref 6.0–8.3)

## 2011-04-30 LAB — LIPID PANEL
HDL: 59 mg/dL (ref >39)
LDL Cholesterol: 95 mg/dL (ref 0–99)
Total CHOL/HDL Ratio: 3.1 Ratio

## 2011-05-01 ENCOUNTER — Ambulatory Visit (INDEPENDENT_AMBULATORY_CARE_PROVIDER_SITE_OTHER): Payer: 59 | Admitting: Cardiology

## 2011-05-01 ENCOUNTER — Encounter: Payer: Self-pay | Admitting: Cardiology

## 2011-05-01 VITALS — BP 139/65 | HR 77 | Wt 309.0 lb

## 2011-05-01 DIAGNOSIS — K219 Gastro-esophageal reflux disease without esophagitis: Secondary | ICD-10-CM

## 2011-05-01 DIAGNOSIS — I2510 Atherosclerotic heart disease of native coronary artery without angina pectoris: Secondary | ICD-10-CM

## 2011-05-01 NOTE — Patient Instructions (Signed)
**Note De-identified Yi Falletta Obfuscation** Your physician recommends that you continue on your current medications as directed. Please refer to the Current Medication list given to you today.  Your physician recommends that you schedule a follow-up appointment in: 3 months  

## 2011-05-01 NOTE — Assessment & Plan Note (Signed)
Chest pain has resolved on high dose PPI. Continue same for now.

## 2011-05-01 NOTE — Assessment & Plan Note (Signed)
Clinically stable on medical therapy. No changes made today. Followup arranged.

## 2011-05-01 NOTE — Progress Notes (Signed)
Clinical Summary [name] is a 61 y.o.female presenting for post hospital followup. I just met her in March. She was admitted back to [facility] in the interim with chest pain that ultimately felt to be noncardiac - possible GERD with increase in PPI.  She states that she feels much better at this point. Reports compliance with her medications, still on ASA and Effient.  She denies any melena or hematochezia.   Allergies  Allergen Reactions  . Codeine Shortness Of Breath and Itching  . Penicillins Rash  . Sulfa Antibiotics Rash    Current Outpatient Prescriptions  Medication Sig Dispense Refill  . albuterol (PROVENTIL) (2.5 MG/3ML) 0.083% nebulizer solution Take 2.5 mg by nebulization every 6 (six) hours as needed. For wheezing. Combines with atrovent      . amLODipine (NORVASC) 10 MG tablet Take 1 tablet (10 mg total) by mouth daily.  30 tablet  0  . aspirin EC 81 MG EC tablet Take 1 tablet (81 mg total) by mouth daily.      [name] atorvastatin (LIPITOR) 40 MG tablet Take 1 tablet (40 mg total) by mouth daily at 6 PM.  30 tablet  3  . carvedilol (COREG) 3.125 MG tablet Take 1 tablet (3.125 mg total) by mouth 2 (two) times daily with a meal.  60 tablet  6  . cetirizine (ZYRTEC) 10 MG tablet Take 10 mg by mouth every morning.       . Ferrous Sulfate (IRON) 325 (65 FE) MG TABS Take 1 tablet by mouth daily after breakfast.       . Fluticasone-Salmeterol (ADVAIR) 250-50 MCG/DOSE AEPB Inhale 1 puff into the lungs 2 (two) times daily.  60 each  0  . glipiZIDE-metformin (METAGLIP) 2.5-500 MG per tablet Take 1 tablet by mouth 2 (two) times daily before a meal. Please hold your Metformin for 48hrs after procedure. Resume on 03/21/11      . HYDROcodone-acetaminophen (NORCO) 5-325 MG per tablet Take 1 tablet by mouth 2 (two) times daily. For pain      . ipratropium (ATROVENT) 0.02 % nebulizer solution Take 500 mcg by nebulization every 6 (six) hours as needed. For shortness of breath. Combines it  with Albuterol      . levocetirizine (XYZAL) 5 MG tablet Take 5 mg by mouth every evening.        [name] levothyroxine (SYNTHROID, LEVOTHROID) 150 MCG tablet Take 1 tablet (150 mcg total) by mouth every morning.  30 tablet  1  . meclizine (ANTIVERT) 25 MG tablet Take 25 mg by mouth every morning.       . nitroGLYCERIN (NITROSTAT) 0.4 MG SL tablet Place 1 tablet (0.4 mg total) under the tongue every 5 (five) minutes as needed for chest pain (up to 3 doses).  25 tablet  3  . olmesartan (BENICAR) 40 MG tablet Take 40 mg by mouth every morning.       . pantoprazole (PROTONIX) 40 MG tablet Take 2 tablets (80 mg total) by mouth 2 (two) times daily before a meal.  120 tablet  3  . prasugrel (EFFIENT) 10 MG TABS Take 1 tablet (10 mg total) by mouth daily.  30 tablet  6  . Prenatal Vit-Fe Fumarate-FA (PRENATAL MULTIVITAMIN) TABS Take 1 tablet by mouth daily.        . saxagliptin HCl (ONGLYZA) 5 MG TABS tablet Take 5 mg by mouth every morning.       . triamterene-hydrochlorothiazide (DYAZIDE) 37.5-25 MG per capsule Take 1 capsule  by mouth every morning.        [name] DISCONTD: esomeprazole (NEXIUM) 40 MG capsule Take 40 mg by mouth daily before breakfast.       . DISCONTD: rosuvastatin (CRESTOR) 20 MG tablet Take 1 tablet (20 mg total) by mouth at bedtime.  30 tablet  3    Past Medical History  Diagnosis Date  . Type 2 diabetes mellitus   . Essential hypertension, benign   . Pulmonary embolism     Recurrent, previously on Coumadin  . Hiatal hernia   . Ovarian cancer   . COPD (chronic obstructive pulmonary disease)   . NSTEMI (non-ST elevated myocardial infarction)   . Coronary atherosclerosis of native coronary artery     Multivessel, DES distal left main, unsuccessful PCI circumflex 2/13, LVEF 50-55%  . Hypothyroidism   . Mixed hyperlipidemia   . Overweight   . Ejection fraction     EF 50-55%, echo, March 15, 2011, question of mild posterior hypokinesis, technically difficult study despite the use of  contrast    Past Surgical History  Procedure Date  . Abdominal surgery   . Cholecystectomy   . Appendectomy   . Cesarean section   . Tonsillectomy   . Breast lumpectomy   . Small intestine surgery 12/2007    Dr Lovell Sheehan  . Closure of abdominal wound/wound vac   . Cataract extraction w/phaco 01/29/2011    Procedure: CATARACT EXTRACTION PHACO AND INTRAOCULAR LENS PLACEMENT (IOC);  Surgeon: Gemma Payor;  Location: [facility];  Service: Ophthalmology;  Laterality: Left;  CDE: 1.81    Family History  Problem Relation Age of Onset  . Coronary artery disease Father     MI at age 68  . Heart attack Father   . Coronary artery disease Mother     CABG at age 74  . Heart attack Mother   . Cirrhosis Sister   . Fibromyalgia Sister   . Osteoporosis Sister     Social History [name] reports that she quit smoking about 31 years ago. Her smoking use included Cigarettes. She has never used smokeless tobacco. [name] reports that she does not drink alcohol.  Review of Systems Occasional bruising, otherwise negative.  Physical Examination Filed Vitals:   05/01/11 1431  BP: 139/65  Pulse: 77    Morbidly obese in NAD.  HEENT: Conjunctiva and lids normal, oropharynx clear.  Neck: Supple ,increased girth, no carotid bruits, no thyromegaly.  Lungs: Clear to auscultation, nonlabored breathing at rest.  Cardiac: Regular rate and rhythm, no S3, soft systolic murmur, no pericardial rub.  Abdomen: Protuberent, nontender, bowel sounds present, no guarding or rebound.  Extremities: No pitting edema, distal pulses 2+.  Skin: Warm and dry. Resolving ecchymosis.  Musculoskeletal: No kyphosis.  Neuropsychiatric: Alert and oriented x3, affect grossly appropriate.    Problem List and Plan

## 2011-05-15 ENCOUNTER — Other Ambulatory Visit (HOSPITAL_COMMUNITY): Payer: Self-pay | Admitting: Cardiology

## 2011-07-29 ENCOUNTER — Ambulatory Visit (INDEPENDENT_AMBULATORY_CARE_PROVIDER_SITE_OTHER): Payer: 59 | Admitting: Cardiology

## 2011-07-29 ENCOUNTER — Encounter: Payer: Self-pay | Admitting: Cardiology

## 2011-07-29 VITALS — BP 140/80 | HR 78 | Ht 61.0 in | Wt 310.0 lb

## 2011-07-29 DIAGNOSIS — I2510 Atherosclerotic heart disease of native coronary artery without angina pectoris: Secondary | ICD-10-CM

## 2011-07-29 DIAGNOSIS — E782 Mixed hyperlipidemia: Secondary | ICD-10-CM

## 2011-07-29 DIAGNOSIS — I10 Essential (primary) hypertension: Secondary | ICD-10-CM

## 2011-07-29 DIAGNOSIS — K219 Gastro-esophageal reflux disease without esophagitis: Secondary | ICD-10-CM

## 2011-07-29 DIAGNOSIS — E039 Hypothyroidism, unspecified: Secondary | ICD-10-CM

## 2011-07-29 NOTE — Assessment & Plan Note (Signed)
Symptomatically stable on medical therapy. Continue observation. Anticipate 6 month followup.

## 2011-07-29 NOTE — Assessment & Plan Note (Signed)
Continue medical therapy. Stressed importance of diet and weight loss.

## 2011-07-29 NOTE — Patient Instructions (Addendum)
Your physician recommends that you continue on your current medications as directed. Please refer to the Current Medication list given to you today.  Your physician recommends that you schedule a follow-up appointment in: 6 months  

## 2011-07-29 NOTE — Assessment & Plan Note (Signed)
Patient reports good control of symptoms.

## 2011-07-29 NOTE — Progress Notes (Signed)
Clinical Summary [name] is a medically complex 61 y.o.female presenting for followup. She was seen in April. Fortunately, she has done well, no subsequent hospitalizations, no reported angina.  She indicates compliance with her medications. Does state that she is having some leg pain related to Lipitor. She had previously been on Crestor and was switched during one of her hospitalizations. She reports having similar problems with Lipitor in the past.  She is due to see Dr. Margo [name] in the near future for followup lab work and routine evaluation.  We continue to discuss the importance of weight loss and exercise.   Allergies  Allergen Reactions  . Codeine Shortness Of Breath and Itching  . Penicillins Rash  . Sulfa Antibiotics Rash    Current Outpatient Prescriptions  Medication Sig Dispense Refill  . albuterol (PROVENTIL) (2.5 MG/3ML) 0.083% nebulizer solution Take 2.5 mg by nebulization every 6 (six) hours as needed. For wheezing. Combines with atrovent      . amLODipine (NORVASC) 10 MG tablet Take 1 tablet (10 mg total) by mouth daily.  30 tablet  0  . aspirin EC 81 MG EC tablet Take 1 tablet (81 mg total) by mouth daily.      [name] atorvastatin (LIPITOR) 40 MG tablet Take 1 tablet (40 mg total) by mouth daily at 6 PM.  30 tablet  3  . carvedilol (COREG) 3.125 MG tablet Take 1 tablet (3.125 mg total) by mouth 2 (two) times daily with a meal.  60 tablet  6  . cetirizine (ZYRTEC) 10 MG tablet Take 10 mg by mouth every morning.       [name] 18-103 MCG/ACT inhaler       . Ferrous Sulfate (IRON) 325 (65 FE) MG TABS Take 1 tablet by mouth daily after breakfast.       . Fluticasone-Salmeterol (ADVAIR) 250-50 MCG/DOSE AEPB Inhale 1 puff into the lungs 2 (two) times daily.  60 each  0  . glipiZIDE-metformin (METAGLIP) 2.5-500 MG per tablet Take 1 tablet by mouth 2 (two) times daily before a meal. Please hold your Metformin for 48hrs after procedure. Resume on 03/21/11      .  HYDROcodone-acetaminophen (NORCO) 5-325 MG per tablet Take 1 tablet by mouth 2 (two) times daily. For pain      . ipratropium (ATROVENT) 0.02 % nebulizer solution Take 500 mcg by nebulization every 6 (six) hours as needed. For shortness of breath. Combines it with Albuterol      . levocetirizine (XYZAL) 5 MG tablet Take 5 mg by mouth every evening.        [name] levothyroxine (SYNTHROID, LEVOTHROID) 150 MCG tablet Take 1 tablet (150 mcg total) by mouth every morning.  30 tablet  1  . meclizine (ANTIVERT) 25 MG tablet Take 25 mg by mouth every morning.       . nitroGLYCERIN (NITROSTAT) 0.4 MG SL tablet Place 1 tablet (0.4 mg total) under the tongue every 5 (five) minutes as needed for chest pain (up to 3 doses).  25 tablet  3  . olmesartan (BENICAR) 40 MG tablet Take 40 mg by mouth every morning.       . pantoprazole (PROTONIX) 40 MG tablet Take 2 tablets (80 mg total) by mouth 2 (two) times daily before a meal.  120 tablet  3  . prasugrel (EFFIENT) 10 MG TABS Take 1 tablet (10 mg total) by mouth daily.  30 tablet  6  . Prenatal Vit-Fe Fumarate-FA (PRENATAL MULTIVITAMIN) TABS Take 1 tablet  by mouth daily.        . saxagliptin HCl (ONGLYZA) 5 MG TABS tablet Take 5 mg by mouth every morning.       . triamterene-hydrochlorothiazide (DYAZIDE) 37.5-25 MG per capsule Take 1 capsule by mouth every morning.        [name] DISCONTD: esomeprazole (NEXIUM) 40 MG capsule Take 40 mg by mouth daily before breakfast.       . DISCONTD: rosuvastatin (CRESTOR) 20 MG tablet Take 1 tablet (20 mg total) by mouth at bedtime.  30 tablet  3    Past Medical History  Diagnosis Date  . Type 2 diabetes mellitus   . Essential hypertension, benign   . Pulmonary embolism     Recurrent, previously on Coumadin  . Hiatal hernia   . Ovarian cancer   . COPD (chronic obstructive pulmonary disease)   . NSTEMI (non-ST elevated myocardial infarction)   . Coronary atherosclerosis of native coronary artery     Multivessel, DES distal left  main, unsuccessful PCI circumflex 2/13, LVEF 50-55%  . Hypothyroidism   . Mixed hyperlipidemia   . Overweight   . Ejection fraction     EF 50-55%, echo, March 15, 2011, question of mild posterior hypokinesis, technically difficult study despite the use of contrast    Social History [name] reports that she quit smoking about 31 years ago. Her smoking use included Cigarettes. She has never used smokeless tobacco. [name] reports that she does not drink alcohol.  Review of Systems No reported bleeding problems, no palpitations. Otherwise negative.  Physical Examination Filed Vitals:   07/29/11 1349  BP: 140/80  Pulse: 78    Morbidly obese in NAD.  HEENT: Conjunctiva and lids normal, oropharynx clear.  Neck: Supple ,increased girth, no carotid bruits, no thyromegaly.  Lungs: Clear to auscultation, nonlabored breathing at rest.  Cardiac: Regular rate and rhythm, no S3, soft systolic murmur, no pericardial rub.  Abdomen: Protuberent, nontender, bowel sounds present, no guarding or rebound.  Extremities: No pitting edema, distal pulses 2+.  Skin: Warm and dry. Resolving ecchymosis.  Musculoskeletal: No kyphosis.  Neuropsychiatric: Alert and oriented x3, affect grossly appropriate.     Problem List and Plan   Coronary atherosclerosis of native coronary artery Symptomatically stable on medical therapy. Continue observation. Anticipate 6 month followup.  Mixed hyperlipidemia Reasonable to switch back to Crestor from Lipitor, given concerns about side effects/leg pain. She is due to see Dr. Margo [name] soon with repeat lab work. Would see what cholesterol numbers are at this point, and then make an appropriate equivalent switch to maintain control.  HTN (hypertension), benign Continue medical therapy. Stressed importance of diet and weight loss.  GERD (gastroesophageal reflux disease) Patient reports good control of symptoms.  Hypothyroidism Followup TSH with Dr.  Margo [name].    Jonelle Sidle, M.D., F.A.C.C.

## 2011-07-29 NOTE — Assessment & Plan Note (Signed)
Reasonable to switch back to Crestor from Lipitor, given concerns about side effects/leg pain. She is due to see Dr. Margo Aye soon with repeat lab work. Would see what cholesterol numbers are at this point, and then make an appropriate equivalent switch to maintain control.

## 2011-07-29 NOTE — Assessment & Plan Note (Signed)
Followup TSH with Dr. Margo Aye.

## 2011-08-06 ENCOUNTER — Other Ambulatory Visit (HOSPITAL_COMMUNITY): Payer: Self-pay | Admitting: Physician Assistant

## 2011-08-15 ENCOUNTER — Other Ambulatory Visit (HOSPITAL_COMMUNITY): Payer: Self-pay | Admitting: Physician Assistant

## 2011-09-10 ENCOUNTER — Telehealth: Payer: Self-pay | Admitting: Cardiology

## 2011-09-10 NOTE — Telephone Encounter (Signed)
Patient wants to know if it is okay for her to take more calcium pills. / tg

## 2011-09-10 NOTE — Telephone Encounter (Signed)
Please advise 

## 2011-09-11 NOTE — Telephone Encounter (Signed)
This is likely better addressed by her primary care physician.

## 2011-09-14 NOTE — Telephone Encounter (Signed)
Advised patient of recommendations and verbalizes understanding.

## 2011-10-01 ENCOUNTER — Other Ambulatory Visit (HOSPITAL_COMMUNITY): Payer: Self-pay | Admitting: Cardiology

## 2011-10-09 ENCOUNTER — Other Ambulatory Visit (HOSPITAL_COMMUNITY): Payer: Self-pay | Admitting: Cardiology

## 2011-10-09 NOTE — Telephone Encounter (Signed)
..   Requested Prescriptions   Pending Prescriptions Disp Refills  . carvedilol (COREG) 3.125 MG tablet [Pharmacy Med Name: CARVEDILOL 3.125 MG TABLET] 60 tablet 6    Sig: TAKE 1 TABLET (3.125 MG TOTAL) BY MOUTH 2 (TWO) TIMES DAILY WITH A MEAL.

## 2011-11-24 ENCOUNTER — Other Ambulatory Visit (HOSPITAL_COMMUNITY): Payer: Self-pay | Admitting: Cardiology

## 2012-02-02 ENCOUNTER — Encounter: Payer: Self-pay | Admitting: Cardiology

## 2012-02-02 ENCOUNTER — Ambulatory Visit (INDEPENDENT_AMBULATORY_CARE_PROVIDER_SITE_OTHER): Payer: 59 | Admitting: Cardiology

## 2012-02-02 VITALS — BP 117/76 | HR 76 | Ht 61.0 in | Wt 302.5 lb

## 2012-02-02 DIAGNOSIS — E782 Mixed hyperlipidemia: Secondary | ICD-10-CM

## 2012-02-02 DIAGNOSIS — I10 Essential (primary) hypertension: Secondary | ICD-10-CM

## 2012-02-02 DIAGNOSIS — I2510 Atherosclerotic heart disease of native coronary artery without angina pectoris: Secondary | ICD-10-CM

## 2012-02-02 NOTE — Assessment & Plan Note (Signed)
Blood pressure control is good today. No changes made. 

## 2012-02-02 NOTE — Assessment & Plan Note (Signed)
Tolerating Crestor. Followup lipid panel with Dr. Margo Aye in February.

## 2012-02-02 NOTE — Assessment & Plan Note (Signed)
Symptomatically stable on medical therapy. ECG reviewed. Continue observation. Recommended diet, weight loss, and walking regimen.

## 2012-02-02 NOTE — Patient Instructions (Addendum)
Your physician recommends that you schedule a follow-up appointment in: 6 months  

## 2012-02-02 NOTE — Progress Notes (Signed)
Clinical Summary [name] is a medically complex 62 y.o.female presenting for followup. She was last seen in July 2013. She reports no angina symptoms. States that she has been compliant with her medications, outlined below. She will be due for followup lipids with Dr. Margo Aye in February.  ECG today shows sinus rhythm with borderline low voltage.  Most recent echocardiogram in February 2013 demonstrated LVEF 50-55% with possible posterior hypokinesis of mild degree and diastolic dysfunction.  Allergies  Allergen Reactions  . Codeine Shortness Of Breath and Itching  . Penicillins Rash  . Sulfa Antibiotics Rash    Current Outpatient Prescriptions  Medication Sig Dispense Refill  . albuterol (PROVENTIL) (2.5 MG/3ML) 0.083% nebulizer solution Take 2.5 mg by nebulization every 6 (six) hours as needed. For wheezing. Combines with atrovent      . ALPRAZolam (XANAX) 1 MG tablet Take 1 mg by mouth at bedtime as needed.      [name] amLODipine (NORVASC) 10 MG tablet Take 10 mg by mouth daily.      [name] aspirin EC 81 MG EC tablet Take 1 tablet (81 mg total) by mouth daily.      . carvedilol (COREG) 3.125 MG tablet TAKE 1 TABLET (3.125 MG TOTAL) BY MOUTH 2 (TWO) TIMES DAILY WITH A MEAL.  60 tablet  6  . COMBIVENT 18-103 MCG/ACT inhaler       . EFFIENT 10 MG TABS TAKE 1 TABLET (10 MG TOTAL) BY MOUTH DAILY.  30 tablet  6  . Ferrous Sulfate (IRON) 325 (65 FE) MG TABS Take 1 tablet by mouth daily after breakfast.       . Fluticasone-Salmeterol (ADVAIR) 250-50 MCG/DOSE AEPB Inhale 1 puff into the lungs 2 (two) times daily.  60 each  0  . glipiZIDE-metformin (METAGLIP) 2.5-500 MG per tablet Take 1 tablet by mouth 2 (two) times daily before a meal. Please hold your Metformin for 48hrs after procedure. Resume on 03/21/11      . HYDROcodone-acetaminophen (NORCO) 5-325 MG per tablet Take 1 tablet by mouth 2 (two) times daily. For pain      . ipratropium (ATROVENT) 0.02 % nebulizer solution Take 500 mcg by  nebulization every 6 (six) hours as needed. For shortness of breath. Combines it with Albuterol      . levocetirizine (XYZAL) 5 MG tablet Take 5 mg by mouth every evening.        [name] levothyroxine (SYNTHROID, LEVOTHROID) 150 MCG tablet Take 1 tablet (150 mcg total) by mouth every morning.  30 tablet  1  . meclizine (ANTIVERT) 25 MG tablet Take 25 mg by mouth every morning.       . nitroGLYCERIN (NITROSTAT) 0.4 MG SL tablet Place 1 tablet (0.4 mg total) under the tongue every 5 (five) minutes as needed for chest pain (up to 3 doses).  25 tablet  3  . olmesartan (BENICAR) 40 MG tablet Take 40 mg by mouth every morning.       . pantoprazole (PROTONIX) 40 MG tablet TAKE 2 TABLETS (80 MG TOTAL) BY MOUTH 2 (TWO) TIMES DAILY BEFORE A MEAL.  120 tablet  3  . Prenatal Vit-Fe Fumarate-FA (PRENATAL MULTIVITAMIN) TABS Take 1 tablet by mouth daily.        . rosuvastatin (CRESTOR) 20 MG tablet Take 20 mg by mouth daily.      . saxagliptin HCl (ONGLYZA) 5 MG TABS tablet Take 5 mg by mouth every morning.       . triamterene-hydrochlorothiazide (DYAZIDE) 37.5-25 MG per  capsule Take 1 capsule by mouth every morning.        . [DISCONTINUED] esomeprazole (NEXIUM) 40 MG capsule Take 40 mg by mouth daily before breakfast.         Past Medical History  Diagnosis Date  . Type 2 diabetes mellitus   . Essential hypertension, benign   . Pulmonary embolism     Recurrent, previously on Coumadin  . Hiatal hernia   . Ovarian cancer   . COPD (chronic obstructive pulmonary disease)   . NSTEMI (non-ST elevated myocardial infarction)   . Coronary atherosclerosis of native coronary artery     Multivessel, DES distal left main, unsuccessful PCI circumflex 2/13, LVEF 50-55%  . Hypothyroidism   . Mixed hyperlipidemia   . Overweight   . Ejection fraction     EF 50-55%, echo, March 15, 2011, question of mild posterior hypokinesis, technically difficult study despite the use of contrast    Social History [name]  reports that she quit smoking about 32 years ago. Her smoking use included Cigarettes. She has never used smokeless tobacco. [name] reports that she does not drink alcohol.  Review of Systems No palpitations or syncope. Stable appetite. Otherwise negative.  Physical Examination Filed Vitals:   02/02/12 1307  BP: 117/76  Pulse: 76   Filed Weights   02/02/12 1307  Weight: 302 lb 8 oz (137.213 kg)    Morbidly obese in NAD.  HEENT: Conjunctiva and lids normal, oropharynx clear.  Neck: Supple ,increased girth, no carotid bruits, no thyromegaly.  Lungs: Clear to auscultation, nonlabored breathing at rest.  Cardiac: Regular rate and rhythm, no S3, soft systolic murmur, no pericardial rub.  Abdomen: Protuberent, nontender, bowel sounds present, no guarding or rebound.  Extremities: No pitting edema, distal pulses 2+.     Problem List and Plan   Coronary atherosclerosis of native coronary artery Symptomatically stable on medical therapy. ECG reviewed. Continue observation. Recommended diet, weight loss, and walking regimen.  Essential hypertension, benign Blood pressure control is good today. No changes made.  Mixed hyperlipidemia Tolerating Crestor. Followup lipid panel with Dr. Margo Aye in February.    Jonelle Sidle, M.D., F.A.C.C.

## 2012-03-19 ENCOUNTER — Other Ambulatory Visit (HOSPITAL_COMMUNITY): Payer: Self-pay | Admitting: Cardiology

## 2012-03-22 ENCOUNTER — Encounter: Payer: Self-pay | Admitting: Cardiology

## 2012-04-16 ENCOUNTER — Other Ambulatory Visit (HOSPITAL_COMMUNITY): Payer: Self-pay | Admitting: Cardiology

## 2012-05-06 ENCOUNTER — Other Ambulatory Visit (HOSPITAL_COMMUNITY): Payer: Self-pay | Admitting: Cardiology

## 2012-05-09 ENCOUNTER — Other Ambulatory Visit (HOSPITAL_COMMUNITY): Payer: Self-pay | Admitting: Cardiology

## 2012-05-10 ENCOUNTER — Other Ambulatory Visit: Payer: Self-pay | Admitting: *Deleted

## 2012-05-10 MED ORDER — CARVEDILOL 3.125 MG PO TABS: 3.1250 mg | ORAL_TABLET | Freq: Two times a day (BID) | ORAL | Status: DC

## 2012-05-10 NOTE — Telephone Encounter (Signed)
rx sent to pharmacy by e-script  

## 2012-07-18 ENCOUNTER — Other Ambulatory Visit (HOSPITAL_COMMUNITY): Payer: Self-pay | Admitting: Cardiology

## 2012-07-18 NOTE — Telephone Encounter (Signed)
Medication sent via escribe.  

## 2012-08-04 ENCOUNTER — Ambulatory Visit (INDEPENDENT_AMBULATORY_CARE_PROVIDER_SITE_OTHER): Payer: 59 | Admitting: Cardiology

## 2012-08-04 ENCOUNTER — Encounter: Payer: Self-pay | Admitting: Cardiology

## 2012-08-04 VITALS — BP 122/65 | HR 66 | Ht 61.0 in | Wt 296.0 lb

## 2012-08-04 DIAGNOSIS — I10 Essential (primary) hypertension: Secondary | ICD-10-CM

## 2012-08-04 DIAGNOSIS — I2510 Atherosclerotic heart disease of native coronary artery without angina pectoris: Secondary | ICD-10-CM

## 2012-08-04 DIAGNOSIS — E782 Mixed hyperlipidemia: Secondary | ICD-10-CM

## 2012-08-04 NOTE — Progress Notes (Signed)
Clinical Summary [name] is a medically complex 62 y.o.female last seen in January. From a cardiac perspective she has been stable. Reports no angina symptoms. Has been trying to lose some weight. ECG today shows normal sinus rhythm with nonspecific ST changes. She reports compliance with her medications, also on low-dose aspirin which is not reflected in her long-term list.  Lab work from February showed potassium 4.3, BUN 27, crit and 1.1, AST 20, ALT 19, hemoglobin 13.3, platelets 262, cholesterol 196, triglycerides 121, HDL 53, LDL 119, TSH 1.7.  She will be seeing Dr. Margo Aye soon for a physical.   Allergies  Allergen Reactions  . Codeine Shortness Of Breath and Itching  . Penicillins Rash  . Sulfa Antibiotics Rash    Current Outpatient Prescriptions  Medication Sig Dispense Refill  . albuterol (PROVENTIL) (2.5 MG/3ML) 0.083% nebulizer solution Take 2.5 mg by nebulization every 6 (six) hours as needed. For wheezing. Combines with atrovent      . ALPRAZolam (XANAX) 1 MG tablet Take 1 mg by mouth at bedtime as needed.      [name] amLODipine (NORVASC) 10 MG tablet Take 10 mg by mouth daily.      . calcium-vitamin D (OSCAL WITH D) 500-200 MG-UNIT per tablet Take 1 tablet by mouth.      . carvedilol (COREG) 3.125 MG tablet Take 1 tablet (3.125 mg total) by mouth 2 (two) times daily.  180 tablet  3  . EFFIENT 10 MG TABS TAKE 1 TABLET (10 MG TOTAL) BY MOUTH DAILY.  30 tablet  6  . Ferrous Sulfate (IRON) 325 (65 FE) MG TABS Take 1 tablet by mouth daily after breakfast.       . Fluticasone-Salmeterol (ADVAIR) 250-50 MCG/DOSE AEPB Inhale 1 puff into the lungs 2 (two) times daily.  60 each  0  . glipiZIDE-metformin (METAGLIP) 2.5-500 MG per tablet Take 1 tablet by mouth 2 (two) times daily before a meal. Please hold your Metformin for 48hrs after procedure. Resume on 03/21/11      . HYDROcodone-acetaminophen (NORCO) 5-325 MG per tablet Take 1 tablet by mouth 2 (two) times daily. For pain      .  ipratropium (ATROVENT) 0.02 % nebulizer solution Take 500 mcg by nebulization every 6 (six) hours as needed. For shortness of breath. Combines it with Albuterol      . levothyroxine (SYNTHROID, LEVOTHROID) 150 MCG tablet Take 1 tablet (150 mcg total) by mouth every morning.  30 tablet  1  . loratadine (CLARITIN) 10 MG tablet Take 10 mg by mouth daily.      . meclizine (ANTIVERT) 25 MG tablet Take 25 mg by mouth every morning.       . olmesartan (BENICAR) 40 MG tablet Take 40 mg by mouth every morning.       . pantoprazole (PROTONIX) 40 MG tablet TAKE 2 TABLETS (80 MG TOTAL) BY MOUTH 2 (TWO) TIMES DAILY BEFORE A MEAL.  120 tablet  0  . Prenatal Vit-Fe Fumarate-FA (PRENATAL MULTIVITAMIN) TABS Take 1 tablet by mouth daily.        . rosuvastatin (CRESTOR) 20 MG tablet Take 20 mg by mouth daily.      . saxagliptin HCl (ONGLYZA) 5 MG TABS tablet Take 5 mg by mouth every morning.       . triamterene-hydrochlorothiazide (DYAZIDE) 37.5-25 MG per capsule Take 1 capsule by mouth every morning.        . nitroGLYCERIN (NITROSTAT) 0.4 MG SL tablet Place 1 tablet (0.4 mg  total) under the tongue every 5 (five) minutes as needed for chest pain (up to 3 doses).  25 tablet  3  . [DISCONTINUED] esomeprazole (NEXIUM) 40 MG capsule Take 40 mg by mouth daily before breakfast.        No current facility-administered medications for this visit.    Past Medical History  Diagnosis Date  . Type 2 diabetes mellitus   . Essential hypertension, benign   . Pulmonary embolism     Recurrent, previously on Coumadin  . Hiatal hernia   . Ovarian cancer   . COPD (chronic obstructive pulmonary disease)   . NSTEMI (non-ST elevated myocardial infarction)   . Coronary atherosclerosis of native coronary artery     Multivessel, DES distal left main, unsuccessful PCI circumflex 2/13, LVEF 50-55%  . Hypothyroidism   . Mixed hyperlipidemia   . Overweight(278.02)   . Ejection fraction     EF 50-55%, echo, March 15, 2011,  question of mild posterior hypokinesis, technically difficult study despite the use of contrast    Social History [name] reports that she quit smoking about 32 years ago. Her smoking use included Cigarettes. She smoked 0.00 packs per day. She has never used smokeless tobacco. [name] reports that she does not drink alcohol.  Review of Systems No palpitations. No reported bleeding problems with DAPT. No orthopnea or PND.  Physical Examination Filed Vitals:   08/04/12 1302  BP: 122/65  Pulse: 66   Filed Weights   08/04/12 1302  Weight: 296 lb (134.265 kg)    Morbidly obese, comfortable at rest.  HEENT: Conjunctiva and lids normal, oropharynx clear.  Neck: Supple ,increased girth, no carotid bruits, no thyromegaly.  Lungs: Clear to auscultation, nonlabored breathing at rest.  Cardiac: Regular rate and rhythm, no S3, soft systolic murmur, no pericardial rub.  Abdomen: Protuberent, nontender, bowel sounds present, no guarding or rebound.  Extremities: No pitting edema, distal pulses 2+.    Problem List and Plan   Coronary atherosclerosis of native coronary artery Symptomatically stable status post DES to the distal left main with unsuccessful PCI of the circumflex last year. Continue DAPT. ECG is normal.  Essential hypertension, benign Blood pressure is normal today. No changes made.  Mixed hyperlipidemia LDL was not optimal in February, 119 at that time. She reports compliance with Crestor. She will be seeing Dr. Margo Aye for repeat lab work soon. Would try and get LDL at least under 100, preferably closer to 70.    Jonelle Sidle, M.D., F.A.C.C.

## 2012-08-04 NOTE — Assessment & Plan Note (Signed)
Blood pressure is normal today. No changes made. 

## 2012-08-04 NOTE — Assessment & Plan Note (Signed)
Symptomatically stable status post DES to the distal left main with unsuccessful PCI of the circumflex last year. Continue DAPT. ECG is normal.

## 2012-08-04 NOTE — Patient Instructions (Addendum)
Your physician recommends that you schedule a follow-up appointment in: 6 months  

## 2012-08-04 NOTE — Assessment & Plan Note (Signed)
LDL was not optimal in February, 119 at that time. She reports compliance with Crestor. She will be seeing Dr. Margo Aye for repeat lab work soon. Would try and get LDL at least under 100, preferably closer to 70.

## 2012-08-12 ENCOUNTER — Other Ambulatory Visit (HOSPITAL_COMMUNITY): Payer: Self-pay | Admitting: Cardiology

## 2012-09-14 ENCOUNTER — Encounter: Payer: Self-pay | Admitting: Cardiology

## 2012-09-21 ENCOUNTER — Other Ambulatory Visit (HOSPITAL_COMMUNITY): Payer: Self-pay | Admitting: Cardiology

## 2012-09-21 NOTE — Telephone Encounter (Signed)
[place]

## 2012-09-21 NOTE — Telephone Encounter (Signed)
[name]

## 2012-12-08 ENCOUNTER — Other Ambulatory Visit (HOSPITAL_COMMUNITY): Payer: Self-pay | Admitting: Cardiology

## 2013-01-31 ENCOUNTER — Observation Stay (HOSPITAL_COMMUNITY): Payer: 59

## 2013-01-31 ENCOUNTER — Emergency Department (HOSPITAL_COMMUNITY): Payer: 59

## 2013-01-31 ENCOUNTER — Other Ambulatory Visit: Payer: Self-pay

## 2013-01-31 ENCOUNTER — Encounter (HOSPITAL_COMMUNITY): Payer: Self-pay | Admitting: Emergency Medicine

## 2013-01-31 ENCOUNTER — Observation Stay (HOSPITAL_COMMUNITY)
Admission: EM | Admit: 2013-01-31 | Discharge: 2013-02-02 | Disposition: A | Discharge status: Home / Self Care | Payer: 59 | Attending: Internal Medicine | Admitting: Internal Medicine

## 2013-01-31 DIAGNOSIS — I2699 Other pulmonary embolism without acute cor pulmonale: Secondary | ICD-10-CM

## 2013-01-31 DIAGNOSIS — E039 Hypothyroidism, unspecified: Secondary | ICD-10-CM

## 2013-01-31 DIAGNOSIS — E119 Type 2 diabetes mellitus without complications: Secondary | ICD-10-CM

## 2013-01-31 DIAGNOSIS — K449 Diaphragmatic hernia without obstruction or gangrene: Secondary | ICD-10-CM

## 2013-01-31 DIAGNOSIS — J4489 Other specified chronic obstructive pulmonary disease: Secondary | ICD-10-CM | POA: Insufficient documentation

## 2013-01-31 DIAGNOSIS — R0602 Shortness of breath: Secondary | ICD-10-CM | POA: Insufficient documentation

## 2013-01-31 DIAGNOSIS — I10 Essential (primary) hypertension: Secondary | ICD-10-CM

## 2013-01-31 DIAGNOSIS — R079 Chest pain, unspecified: Principal | ICD-10-CM

## 2013-01-31 DIAGNOSIS — I2510 Atherosclerotic heart disease of native coronary artery without angina pectoris: Secondary | ICD-10-CM

## 2013-01-31 DIAGNOSIS — J449 Chronic obstructive pulmonary disease, unspecified: Secondary | ICD-10-CM

## 2013-01-31 DIAGNOSIS — K219 Gastro-esophageal reflux disease without esophagitis: Secondary | ICD-10-CM

## 2013-01-31 DIAGNOSIS — E782 Mixed hyperlipidemia: Secondary | ICD-10-CM

## 2013-01-31 LAB — CBC WITH DIFFERENTIAL/PLATELET
BASOS ABS: 0 10*3/uL (ref 0.0–0.1)
Basophils Relative: 0 % (ref 0–1)
Eosinophils Absolute: 0.1 10*3/uL (ref 0.0–0.7)
Eosinophils Relative: 1 % (ref 0–5)
HEMATOCRIT: 40.7 % (ref 36.0–46.0)
Hemoglobin: 13.4 g/dL (ref 12.0–15.0)
LYMPHS ABS: 1.2 10*3/uL (ref 0.7–4.0)
LYMPHS PCT: 13 % (ref 12–46)
MCH: 27.6 pg (ref 26.0–34.0)
MCHC: 32.9 g/dL (ref 30.0–36.0)
MCV: 83.9 fL (ref 78.0–100.0)
Monocytes Absolute: 0.6 10*3/uL (ref 0.1–1.0)
Monocytes Relative: 6 % (ref 3–12)
NEUTROS ABS: 7.5 10*3/uL (ref 1.7–7.7)
Neutrophils Relative %: 79 % — ABNORMAL HIGH (ref 43–77)
PLATELETS: 250 10*3/uL (ref 150–400)
RBC: 4.85 MIL/uL (ref 3.87–5.11)
RDW: 13.9 % (ref 11.5–15.5)
WBC: 9.4 10*3/uL (ref 4.0–10.5)

## 2013-01-31 LAB — BASIC METABOLIC PANEL
BUN: 27 mg/dL — ABNORMAL HIGH (ref 6–23)
CHLORIDE: 100 meq/L (ref 96–112)
CO2: 24 meq/L (ref 19–32)
Calcium: 10.1 mg/dL (ref 8.4–10.5)
Creatinine, Ser: 0.94 mg/dL (ref 0.50–1.10)
GFR calc Af Amer: 73 mL/min — ABNORMAL LOW (ref >90)
GFR calc non Af Amer: 63 mL/min — ABNORMAL LOW (ref >90)
GLUCOSE: 136 mg/dL — AB (ref 70–99)
POTASSIUM: 3.9 meq/L (ref 3.7–5.3)
SODIUM: 140 meq/L (ref 137–147)

## 2013-01-31 LAB — TROPONIN I: Troponin I: <0.3 ng/mL (ref <0.30)

## 2013-01-31 LAB — URINALYSIS, ROUTINE W REFLEX MICROSCOPIC
Bilirubin Urine: NEGATIVE
GLUCOSE, UA: NEGATIVE mg/dL
Hgb urine dipstick: NEGATIVE
Ketones, ur: NEGATIVE mg/dL
Nitrite: NEGATIVE
PROTEIN: NEGATIVE mg/dL
SPECIFIC GRAVITY, URINE: 1.01 (ref 1.005–1.030)
UROBILINOGEN UA: 0.2 mg/dL (ref 0.0–1.0)
pH: 5.5 (ref 5.0–8.0)

## 2013-01-31 LAB — URINE MICROSCOPIC-ADD ON

## 2013-01-31 LAB — PRO B NATRIURETIC PEPTIDE: PRO B NATRI PEPTIDE: 440 pg/mL — AB (ref 0–125)

## 2013-01-31 LAB — D-DIMER, QUANTITATIVE: D-Dimer, Quant: 1.22 ug/mL-FEU — ABNORMAL HIGH (ref 0.00–0.48)

## 2013-01-31 MED ORDER — DOCUSATE SODIUM 100 MG PO CAPS
100.0000 mg | ORAL_CAPSULE | Freq: Two times a day (BID) | ORAL | Status: DC
  Administered 2013-01-31 – 2013-02-02 (×4): 100 mg via ORAL
  Filled 2013-01-31 (×5): qty 1

## 2013-01-31 MED ORDER — PRASUGREL HCL 10 MG PO TABS
10.0000 mg | ORAL_TABLET | Freq: Every morning | ORAL | Status: DC
  Administered 2013-02-01 – 2013-02-02 (×2): 10 mg via ORAL
  Filled 2013-01-31 (×3): qty 1

## 2013-01-31 MED ORDER — TRIAMTERENE-HCTZ 37.5-25 MG PO CAPS
1.0000 | ORAL_CAPSULE | Freq: Every day | ORAL | Status: DC
  Filled 2013-01-31 (×2): qty 1

## 2013-01-31 MED ORDER — ALPRAZOLAM 1 MG PO TABS
1.0000 mg | ORAL_TABLET | Freq: Every evening | ORAL | Status: DC | PRN
  Administered 2013-02-01 (×2): 1 mg via ORAL
  Filled 2013-01-31 (×2): qty 1

## 2013-01-31 MED ORDER — SODIUM CHLORIDE 0.9 % IJ SOLN: 3.0000 mL | INTRAMUSCULAR | Status: DC | PRN

## 2013-01-31 MED ORDER — ACETAMINOPHEN 325 MG PO TABS: 650.0000 mg | ORAL_TABLET | Freq: Four times a day (QID) | ORAL | Status: DC | PRN

## 2013-01-31 MED ORDER — PANTOPRAZOLE SODIUM 40 MG PO TBEC
80.0000 mg | DELAYED_RELEASE_TABLET | Freq: Two times a day (BID) | ORAL | Status: DC
  Administered 2013-02-01 – 2013-02-02 (×3): 80 mg via ORAL
  Filled 2013-01-31 (×3): qty 2

## 2013-01-31 MED ORDER — SODIUM CHLORIDE 0.9 % IJ SOLN: 3.0000 mL | Freq: Two times a day (BID) | INTRAMUSCULAR | Status: DC

## 2013-01-31 MED ORDER — CARVEDILOL 3.125 MG PO TABS
3.1250 mg | ORAL_TABLET | Freq: Two times a day (BID) | ORAL | Status: DC
  Administered 2013-01-31 – 2013-02-01 (×3): 3.125 mg via ORAL
  Filled 2013-01-31 (×3): qty 1

## 2013-01-31 MED ORDER — IRBESARTAN 300 MG PO TABS
300.0000 mg | ORAL_TABLET | Freq: Every day | ORAL | Status: DC
  Administered 2013-02-01 – 2013-02-02 (×2): 300 mg via ORAL
  Filled 2013-01-31 (×2): qty 1

## 2013-01-31 MED ORDER — MOMETASONE FURO-FORMOTEROL FUM 100-5 MCG/ACT IN AERO
INHALATION_SPRAY | RESPIRATORY_TRACT | Status: AC
  Filled 2013-01-31: qty 8.8

## 2013-01-31 MED ORDER — INSULIN ASPART 100 UNIT/ML ~~LOC~~ SOLN
0.0000 [IU] | Freq: Three times a day (TID) | SUBCUTANEOUS | Status: DC
  Administered 2013-02-01: 3 [IU] via SUBCUTANEOUS
  Administered 2013-02-02: 4 [IU] via SUBCUTANEOUS

## 2013-01-31 MED ORDER — PANTOPRAZOLE SODIUM 40 MG IV SOLR
40.0000 mg | Freq: Once | INTRAVENOUS | Status: AC
Start: 2013-01-31 — End: 2013-01-31
  Administered 2013-01-31: 40 mg via INTRAVENOUS
  Filled 2013-01-31: qty 40

## 2013-01-31 MED ORDER — ACETAMINOPHEN 650 MG RE SUPP: 650.0000 mg | Freq: Four times a day (QID) | RECTAL | Status: DC | PRN

## 2013-01-31 MED ORDER — ASPIRIN EC 325 MG PO TBEC
325.0000 mg | DELAYED_RELEASE_TABLET | Freq: Every day | ORAL | Status: DC
Start: 2013-02-01 — End: 2013-02-01
  Administered 2013-02-01: 325 mg via ORAL
  Filled 2013-01-31: qty 1

## 2013-01-31 MED ORDER — HYDROCODONE-ACETAMINOPHEN 5-325 MG PO TABS
1.0000 | ORAL_TABLET | Freq: Two times a day (BID) | ORAL | Status: DC
  Administered 2013-01-31 – 2013-02-02 (×4): 1 via ORAL
  Filled 2013-01-31 (×4): qty 1

## 2013-01-31 MED ORDER — MOMETASONE FURO-FORMOTEROL FUM 100-5 MCG/ACT IN AERO
2.0000 | INHALATION_SPRAY | Freq: Two times a day (BID) | RESPIRATORY_TRACT | Status: DC
Start: 2013-01-31 — End: 2013-02-02
  Administered 2013-01-31 – 2013-02-02 (×4): 2 via RESPIRATORY_TRACT
  Filled 2013-01-31: qty 8.8

## 2013-01-31 MED ORDER — SODIUM CHLORIDE 0.9 % IJ SOLN
3.0000 mL | Freq: Two times a day (BID) | INTRAMUSCULAR | Status: DC
  Administered 2013-01-31: via INTRAVENOUS
  Administered 2013-02-01: 3 mL via INTRAVENOUS

## 2013-01-31 MED ORDER — ONDANSETRON HCL 4 MG PO TABS: 4.0000 mg | ORAL_TABLET | Freq: Four times a day (QID) | ORAL | Status: DC | PRN

## 2013-01-31 MED ORDER — INSULIN ASPART 100 UNIT/ML ~~LOC~~ SOLN: 0.0000 [IU] | Freq: Every day | SUBCUTANEOUS | Status: DC

## 2013-01-31 MED ORDER — ATORVASTATIN CALCIUM 40 MG PO TABS
40.0000 mg | ORAL_TABLET | Freq: Every day | ORAL | Status: DC
  Administered 2013-02-01: 40 mg via ORAL
  Filled 2013-01-31: qty 1

## 2013-01-31 MED ORDER — ENOXAPARIN SODIUM 60 MG/0.6ML ~~LOC~~ SOLN
60.0000 mg | Freq: Every day | SUBCUTANEOUS | Status: DC
  Administered 2013-01-31 – 2013-02-01 (×2): 60 mg via SUBCUTANEOUS
  Filled 2013-01-31 (×2): qty 0.6

## 2013-01-31 MED ORDER — MORPHINE SULFATE 2 MG/ML IJ SOLN: 1.0000 mg | INTRAMUSCULAR | Status: DC | PRN

## 2013-01-31 MED ORDER — NITROGLYCERIN 0.4 MG SL SUBL
0.4000 mg | SUBLINGUAL_TABLET | SUBLINGUAL | Status: DC | PRN
  Administered 2013-01-31 (×2): 0.4 mg via SUBLINGUAL
  Filled 2013-01-31: qty 25

## 2013-01-31 MED ORDER — ONDANSETRON HCL 4 MG/2ML IJ SOLN
4.0000 mg | Freq: Once | INTRAMUSCULAR | Status: AC
  Administered 2013-01-31: 4 mg via INTRAVENOUS
  Filled 2013-01-31: qty 2

## 2013-01-31 MED ORDER — LEVOTHYROXINE SODIUM 75 MCG PO TABS
150.0000 ug | ORAL_TABLET | Freq: Every day | ORAL | Status: DC
  Administered 2013-02-01 – 2013-02-02 (×2): 150 ug via ORAL
  Filled 2013-01-31 (×2): qty 2

## 2013-01-31 MED ORDER — MORPHINE SULFATE 2 MG/ML IJ SOLN
2.0000 mg | Freq: Once | INTRAMUSCULAR | Status: AC
  Administered 2013-01-31: 2 mg via INTRAVENOUS
  Filled 2013-01-31: qty 1

## 2013-01-31 MED ORDER — GI COCKTAIL ~~LOC~~
30.0000 mL | Freq: Once | ORAL | Status: AC
  Administered 2013-01-31: 30 mL via ORAL
  Filled 2013-01-31: qty 30

## 2013-01-31 MED ORDER — SODIUM CHLORIDE 0.9 % IV SOLN
INTRAVENOUS | Status: DC
  Administered 2013-01-31: 17:00:00 via INTRAVENOUS

## 2013-01-31 MED ORDER — AMLODIPINE BESYLATE 5 MG PO TABS
10.0000 mg | ORAL_TABLET | Freq: Every day | ORAL | Status: DC
  Administered 2013-02-01 – 2013-02-02 (×2): 10 mg via ORAL
  Filled 2013-01-31 (×2): qty 2

## 2013-01-31 MED ORDER — MECLIZINE HCL 12.5 MG PO TABS
25.0000 mg | ORAL_TABLET | Freq: Two times a day (BID) | ORAL | Status: DC
  Administered 2013-01-31 – 2013-02-02 (×4): 25 mg via ORAL
  Filled 2013-01-31 (×4): qty 2

## 2013-01-31 MED ORDER — SODIUM CHLORIDE 0.9 % IV SOLN: 250.0000 mL | INTRAVENOUS | Status: DC | PRN

## 2013-01-31 MED ORDER — ONDANSETRON HCL 4 MG/2ML IJ SOLN: 4.0000 mg | Freq: Four times a day (QID) | INTRAMUSCULAR | Status: DC | PRN

## 2013-01-31 NOTE — ED Provider Notes (Addendum)
CSN: [id number]     Arrival date & time 01/31/13  1613 History  This chart was scribed for Mervin Kung, MD by Donato Schultz, ED Scribe. This patient was seen in room APA19/[facility]19 and the patient's care was started at 4:24 PM.     Chief Complaint  Patient presents with  . Chest Pain    Patient is a 63 y.o. female presenting with chest pain. The history is provided by the patient. No language interpreter was used.  Chest Pain Pain location:  L chest Pain quality: burning and pressure   Pain radiates to:  Upper back Duration:  2 days Timing:  Constant Progression:  Worsening Chronicity:  New Ineffective treatments:  Nitroglycerin Associated symptoms: back pain, cough, diaphoresis, dizziness, headache, nausea and shortness of breath   Associated symptoms: no abdominal pain, no fever and not vomiting   Cough:    Duration:  2 days Headaches:    Duration:  2 days Risk factors: coronary artery disease and diabetes mellitus    HPI Comments: [name] is a 63 y.o. female with a history of DM and CAD who presents to the Emergency Department complaining of constant, worsening left sided chest pain that started yesterday afternoon.  She states that the pain started as a pressure in her chest.  She states that her symptoms went away last night and returned again this afternoon before she ate lunch and has been constant since then and is still located on her left side.  She denies experiencing any chest pain on Sunday.  She rates the pain at a 5/10.  She states that she took Nitroglycerin at home today with mild relief to her pain.  She states that she did not take Nitroglycerin yesterday.  She states that EMS did not administer any Nitroglycerin PTA. She states that she started exhibiting rhinorrhea, cough, sore throat, and headaches two days ago.   She states that she was taking Coumadin for 30 years but is now taking Effient.  The patient has a history of cardiac catheter placement in  2013 and an unsuccessful circumflex due to her Multivessel Disease.  The patient states that she has one stent.    The patient's PCP is Dr. [name] and her cardiologist is Dr. Domenic Polite at [place].   Past Medical History  Diagnosis Date  . Type 2 diabetes mellitus   . Essential hypertension, benign   . Pulmonary embolism     Recurrent, previously on Coumadin  . Hiatal hernia   . Ovarian cancer   . COPD (chronic obstructive pulmonary disease)   . NSTEMI (non-ST elevated myocardial infarction)   . Coronary atherosclerosis of native coronary artery     Multivessel, DES distal left main, unsuccessful PCI circumflex 2/13, LVEF 50-55%  . Hypothyroidism   . Mixed hyperlipidemia   . Overweight(278.02)   . Ejection fraction     EF 50-55%, echo, March 15, 2011, question of mild posterior hypokinesis, technically difficult study despite the use of contrast   Past Surgical History  Procedure Laterality Date  . Abdominal surgery    . Cholecystectomy    . Appendectomy    . Cesarean section    . Tonsillectomy    . Breast lumpectomy    . Small intestine surgery  12/2007    Dr [name]  . Closure of abdominal wound/wound vac    . Cataract extraction w/phaco  01/29/2011    Procedure: CATARACT EXTRACTION PHACO AND INTRAOCULAR LENS PLACEMENT (IOC);  Surgeon: [name];  Location: [facility];  Service: Ophthalmology;  Laterality: Left;  CDE: 1.81   Family History  Problem Relation Age of Onset  . Coronary artery disease Father     MI at age 28  . Heart attack Father   . Coronary artery disease Mother     CABG at age 45  . Heart attack Mother   . Cirrhosis Sister   . Fibromyalgia Sister   . Osteoporosis Sister    History  Substance Use Topics  . Smoking status: Former Smoker    Types: Cigarettes    Quit date: 01/27/1980  . Smokeless tobacco: Never Used  . Alcohol Use: No   OB History   Grav Para Term Preterm Abortions TAB SAB Ect Mult Living                 Review of Systems   Constitutional: Positive for diaphoresis. Negative for fever and chills.  HENT: Positive for rhinorrhea and sore throat.   Eyes: Positive for visual disturbance (blurred vision).  Respiratory: Positive for cough and shortness of breath.   Cardiovascular: Positive for chest pain. Negative for leg swelling.  Gastrointestinal: Positive for nausea. Negative for vomiting, abdominal pain and diarrhea.  Genitourinary: Positive for dysuria. Negative for hematuria.  Musculoskeletal: Positive for back pain.  Skin: Negative for rash.  Neurological: Positive for dizziness and headaches.  Hematological: Bruises/bleeds easily.  Psychiatric/Behavioral: Negative for confusion.  All other systems reviewed and are negative.    Allergies  Codeine; Penicillins; and Sulfa antibiotics  Home Medications   Current Outpatient Rx  Name  Route  Sig  Dispense  Refill  . albuterol (PROVENTIL) (2.5 MG/3ML) 0.083% nebulizer solution   Nebulization   Take 2.5 mg by nebulization every 6 (six) hours as needed. For wheezing. Combines with atrovent         . ALPRAZolam (XANAX) 1 MG tablet   Oral   Take 1 mg by mouth at bedtime as needed for anxiety or sleep.          [name] amLODipine (NORVASC) 10 MG tablet   Oral   Take 10 mg by mouth daily.         . calcium-vitamin D (OSCAL WITH D) 500-200 MG-UNIT per tablet   Oral   Take 1 tablet by mouth daily.          . carvedilol (COREG) 3.125 MG tablet   Oral   Take 1 tablet (3.125 mg total) by mouth 2 (two) times daily.   180 tablet   3   . Ferrous Sulfate (IRON) 325 (65 FE) MG TABS   Oral   Take 1 tablet by mouth daily after breakfast.          . Fluticasone-Salmeterol (ADVAIR) 250-50 MCG/DOSE AEPB   Inhalation   Inhale 1 puff into the lungs 2 (two) times daily.   60 each   0   . glipiZIDE-metformin (METAGLIP) 2.5-500 MG per tablet   Oral   Take 1 tablet by mouth 2 (two) times daily before a meal. Please hold your Metformin for 48hrs after  procedure. Resume on 03/21/11         . HYDROcodone-acetaminophen (NORCO) 5-325 MG per tablet   Oral   Take 1 tablet by mouth 2 (two) times daily. For pain         . levothyroxine (SYNTHROID, LEVOTHROID) 150 MCG tablet   Oral   Take 1 tablet (150 mcg total) by mouth every morning.   30 tablet  1   . loratadine (CLARITIN) 10 MG tablet   Oral   Take 10 mg by mouth every morning.          . meclizine (ANTIVERT) 25 MG tablet   Oral   Take 25 mg by mouth 2 (two) times daily.          . nitroGLYCERIN (NITROSTAT) 0.4 MG SL tablet   Sublingual   Place 1 tablet (0.4 mg total) under the tongue every 5 (five) minutes as needed for chest pain (up to 3 doses).   25 tablet   3   . olmesartan (BENICAR) 40 MG tablet   Oral   Take 40 mg by mouth every morning.          . pantoprazole (PROTONIX) 40 MG tablet   Oral   Take 80 mg by mouth 2 (two) times daily before a meal.         . prasugrel (EFFIENT) 10 MG TABS tablet   Oral   Take 10 mg by mouth every morning.         . Prenatal Vit-Fe Fumarate-FA (PRENATAL MULTIVITAMIN) TABS   Oral   Take 1 tablet by mouth daily.           . rosuvastatin (CRESTOR) 20 MG tablet   Oral   Take 20 mg by mouth daily.         . saxagliptin HCl (ONGLYZA) 5 MG TABS tablet   Oral   Take 5 mg by mouth every morning.          . triamterene-hydrochlorothiazide (DYAZIDE) 37.5-25 MG per capsule   Oral   Take 1 capsule by mouth every morning.           [name] ipratropium (ATROVENT) 0.02 % nebulizer solution   Nebulization   Take 500 mcg by nebulization every 6 (six) hours as needed. For shortness of breath. Combines it with Albuterol          Triage Vitals: BP 156/86  Pulse 89  Temp(Src) 98.2 F (36.8 C)  Resp 20  Ht 5\' 1"  (1.549 m)  Wt 283 lb (128.368 kg)  BMI 53.50 kg/m2  SpO2 97%  Physical Exam  Nursing note and vitals reviewed. Constitutional: She is oriented to person, place, and time. She appears well-developed and  well-nourished.  HENT:  Head: Normocephalic and atraumatic.  Mouth/Throat: Mucous membranes are normal.  Eyes: Conjunctivae and EOM are normal. Pupils are equal, round, and reactive to light.  Neck: Normal range of motion and phonation normal. Neck supple.  Cardiovascular: Normal rate, regular rhythm, normal heart sounds and intact distal pulses.   No murmur heard. Pulmonary/Chest: Effort normal and breath sounds normal. No respiratory distress. She has no wheezes. She has no rales.  Abdominal: Soft. Bowel sounds are normal. There is no tenderness.  Musculoskeletal: Normal range of motion. She exhibits no edema.  Neurological: She is alert and oriented to person, place, and time. No cranial nerve deficit. She exhibits normal muscle tone. Coordination normal.  Skin: Skin is warm and dry.  Psychiatric: She has a normal mood and affect. Her behavior is normal. Judgment and thought content normal.    ED Course  Procedures (including critical care time)  DIAGNOSTIC STUDIES: Oxygen Saturation is 97% on room air, normal by my interpretation.    COORDINATION OF CARE: 4:41 PM- Discussed obtaining lab work and the patient agreed to the treatment plan.    Labs Review Labs Reviewed  CBC WITH DIFFERENTIAL - Abnormal; Notable  for the following:    Neutrophils Relative % 79 (*)    All other components within normal limits  BASIC METABOLIC PANEL - Abnormal; Notable for the following:    Glucose, Bld 136 (*)    BUN 27 (*)    GFR calc non Af Amer 63 (*)    GFR calc Af Amer 73 (*)    All other components within normal limits  URINALYSIS, ROUTINE W REFLEX MICROSCOPIC - Abnormal; Notable for the following:    Leukocytes, UA MODERATE (*)    All other components within normal limits  PRO B NATRIURETIC PEPTIDE - Abnormal; Notable for the following:    Pro B Natriuretic peptide (BNP) 440.0 (*)    All other components within normal limits  TROPONIN I  URINE MICROSCOPIC-ADD ON  TROPONIN I    Results for orders placed during the hospital encounter of 01/31/13  CBC WITH DIFFERENTIAL      Result Value Range   WBC 9.4  4.0 - 10.5 K/uL   RBC 4.85  3.87 - 5.11 MIL/uL   Hemoglobin 13.4  12.0 - 15.0 g/dL   HCT 40.7  36.0 - 46.0 %   MCV 83.9  78.0 - 100.0 fL   MCH 27.6  26.0 - 34.0 pg   MCHC 32.9  30.0 - 36.0 g/dL   RDW 13.9  11.5 - 15.5 %   Platelets 250  150 - 400 K/uL   Neutrophils Relative % 79 (*) 43 - 77 %   Neutro Abs 7.5  1.7 - 7.7 K/uL   Lymphocytes Relative 13  12 - 46 %   Lymphs Abs 1.2  0.7 - 4.0 K/uL   Monocytes Relative 6  3 - 12 %   Monocytes Absolute 0.6  0.1 - 1.0 K/uL   Eosinophils Relative 1  0 - 5 %   Eosinophils Absolute 0.1  0.0 - 0.7 K/uL   Basophils Relative 0  0 - 1 %   Basophils Absolute 0.0  0.0 - 0.1 K/uL  BASIC METABOLIC PANEL      Result Value Range   Sodium 140  137 - 147 mEq/L   Potassium 3.9  3.7 - 5.3 mEq/L   Chloride 100  96 - 112 mEq/L   CO2 24  19 - 32 mEq/L   Glucose, Bld 136 (*) 70 - 99 mg/dL   BUN 27 (*) 6 - 23 mg/dL   Creatinine, Ser 0.94  0.50 - 1.10 mg/dL   Calcium 10.1  8.4 - 10.5 mg/dL   GFR calc non Af Amer 63 (*) >90 mL/min   GFR calc Af Amer 73 (*) >90 mL/min  TROPONIN I      Result Value Range   Troponin I <0.30  <0.30 ng/mL  URINALYSIS, ROUTINE W REFLEX MICROSCOPIC      Result Value Range   Color, Urine YELLOW  YELLOW   APPearance CLEAR  CLEAR   Specific Gravity, Urine 1.010  1.005 - 1.030   pH 5.5  5.0 - 8.0   Glucose, UA NEGATIVE  NEGATIVE mg/dL   Hgb urine dipstick NEGATIVE  NEGATIVE   Bilirubin Urine NEGATIVE  NEGATIVE   Ketones, ur NEGATIVE  NEGATIVE mg/dL   Protein, ur NEGATIVE  NEGATIVE mg/dL   Urobilinogen, UA 0.2  0.0 - 1.0 mg/dL   Nitrite NEGATIVE  NEGATIVE   Leukocytes, UA MODERATE (*) NEGATIVE  URINE MICROSCOPIC-ADD ON      Result Value Range   Squamous Epithelial / LPF RARE  RARE  WBC, UA 3-6  <3 WBC/hpf   Bacteria, UA RARE  RARE  PRO B NATRIURETIC PEPTIDE      Result Value Range   Pro B  Natriuretic peptide (BNP) 440.0 (*) 0 - 125 pg/mL  TROPONIN I      Result Value Range   Troponin I <0.30  <0.30 ng/mL    Imaging Review Dg Chest Portable 1 View  01/31/2013   CLINICAL DATA:  Chest pain, shortness of breath, history diabetes, hypertension, ovarian cancer, COPD, MI  EXAM: PORTABLE CHEST - 1 VIEW  COMPARISON:  Portable exam 1621 hr compared to 04/21/2011  FINDINGS: Enlargement of cardiac silhouette.  Atherosclerotic calcification aortic arch.  Mediastinal contours and pulmonary vascularity normal.  Lungs clear.  No pleural effusion or pneumothorax.  Bones unremarkable.  IMPRESSION: Enlargement of cardiac silhouette.  No acute abnormalities.   Electronically Signed   By: Lavonia Dana M.D.   On: 01/31/2013 16:31    EKG Interpretation   None      Date: 01/31/2013  Rate: 86  Rhythm: normal sinus rhythm  QRS Axis: normal  Intervals: normal  ST/T Wave abnormalities: nonspecific ST changes  Conduction Disutrbances:none  Narrative Interpretation:   Old EKG Reviewed: unchanged No sniffing change in EKG compared to 04/21/2011   MDM   1. Chest pain    Patient's chest pain was never severe but she is now chest pain-free after 2 mg of morphine. Patient does have a stent however troponins here are negative x2 hours apart and rest of the workup is essentially negative. Doubt that this is a significant cardiac chest pain but due to the stents and the duration of the pain probably does warrant admission. Patient has been followed by [facility] cardiology in the past.  I personally performed the services described in this documentation, which was scribed in my presence. The recorded information has been reviewed and is accurate.     Mervin Kung, MD 01/31/13 2049  Patient now pain free after the morphine. We'll be admitted to telemetry temporary admit orders provided. Discussed with the hospitalist.  Mervin Kung, MD 01/31/13 2059

## 2013-01-31 NOTE — ED Notes (Signed)
Pt c/o "not felling well" x 2 days with cp starting today.

## 2013-01-31 NOTE — H&P (Signed)
[facility] History and Physical  [name] SKA:[id number] DOB: August 11, 1950 DOA: 01/31/2013   PCP: Delphina Cahill, MD  Specialists: Followed by Dr. Domenic Polite, with cardiology  Chief Complaint: Chest pain and nausea, since yesterday  HPI: [name] is a 63 y.o. female with a past medical history of CAD with a PTCI to distal left mainstem and the LAD in February of 2013 with a drug-eluting stent. She also has a history of morbid obesity, diabetes mellitus, type II, history of recurrent pulmonary embolism, hiatal hernia, and who was in her usual state of health till yesterday when she started feeling nauseous and had started having sweating episodes. She noted that her BP was high. This was present along with a pressure-like sensation in the lower part of her chest which was 5/10 in intensity. She had the minimal shortness of breath, but denies any dizziness. Denies any vomiting. No palpitations. Denies any leg swelling. No cough. She feels, that the pain radiates to the back. The pain currently, is 2/10. She also tells me that the pain feels like her previous history of pulmonary embolism. No fever. No chills recently. She does have a mild headache. Denies any sick contacts recently. She took nitroglycerin at home with no relief. She was given nitroglycerin in the ED, with partial relief of her discomfort. She also reports heartburns all day today.  Home Medications: Prior to Admission medications   Medication Sig Start Date End Date Taking? Authorizing Provider  albuterol (PROVENTIL) (2.5 MG/3ML) 0.083% nebulizer solution Take 2.5 mg by nebulization every 6 (six) hours as needed. For wheezing. Combines with atrovent   Yes Historical Provider, MD  ALPRAZolam (XANAX) 1 MG tablet Take 1 mg by mouth at bedtime as needed for anxiety or sleep.    Yes Historical Provider, MD  amLODipine (NORVASC) 10 MG tablet Take 10 mg by mouth daily. 08/11/10  Yes Nimish Luther Parody, MD  calcium-vitamin  D (OSCAL WITH D) 500-200 MG-UNIT per tablet Take 1 tablet by mouth daily.    Yes Historical Provider, MD  carvedilol (COREG) 3.125 MG tablet Take 1 tablet (3.125 mg total) by mouth 2 (two) times daily. 05/10/12  Yes Satira Sark, MD  Ferrous Sulfate (IRON) 325 (65 FE) MG TABS Take 1 tablet by mouth daily after breakfast.    Yes Historical Provider, MD  Fluticasone-Salmeterol (ADVAIR) 250-50 MCG/DOSE AEPB Inhale 1 puff into the lungs 2 (two) times daily. 08/11/10  Yes Nimish Luther Parody, MD  glipiZIDE-metformin (METAGLIP) 2.5-500 MG per tablet Take 1 tablet by mouth 2 (two) times daily before a meal. Please hold your Metformin for 48hrs after procedure. Resume on 03/21/11 03/20/11  Yes Jessica A Hope, PA-C  HYDROcodone-acetaminophen (NORCO) 5-325 MG per tablet Take 1 tablet by mouth 2 (two) times daily. For pain   Yes Historical Provider, MD  levothyroxine (SYNTHROID, LEVOTHROID) 150 MCG tablet Take 1 tablet (150 mcg total) by mouth every morning. 03/20/11  Yes Jessica A Hope, PA-C  loratadine (CLARITIN) 10 MG tablet Take 10 mg by mouth every morning.    Yes Historical Provider, MD  meclizine (ANTIVERT) 25 MG tablet Take 25 mg by mouth 2 (two) times daily.    Yes Historical Provider, MD  nitroGLYCERIN (NITROSTAT) 0.4 MG SL tablet Place 1 tablet (0.4 mg total) under the tongue every 5 (five) minutes as needed for chest pain (up to 3 doses). 03/20/11 01/31/13 Yes Jessica A Hope, PA-C  olmesartan (BENICAR) 40 MG tablet Take 40 mg by mouth every morning.  Yes Historical Provider, MD  pantoprazole (PROTONIX) 40 MG tablet Take 80 mg by mouth 2 (two) times daily before a meal.   Yes Historical Provider, MD  prasugrel (EFFIENT) 10 MG TABS tablet Take 10 mg by mouth every morning.   Yes Historical Provider, MD  Prenatal Vit-Fe Fumarate-FA (PRENATAL MULTIVITAMIN) TABS Take 1 tablet by mouth daily.     Yes Historical Provider, MD  rosuvastatin (CRESTOR) 20 MG tablet Take 20 mg by mouth daily.   Yes Historical  Provider, MD  saxagliptin HCl (ONGLYZA) 5 MG TABS tablet Take 5 mg by mouth every morning.    Yes Historical Provider, MD  triamterene-hydrochlorothiazide (DYAZIDE) 37.5-25 MG per capsule Take 1 capsule by mouth every morning.     Yes Historical Provider, MD  ipratropium (ATROVENT) 0.02 % nebulizer solution Take 500 mcg by nebulization every 6 (six) hours as needed. For shortness of breath. Combines it with Albuterol 08/11/10   Doree Albee, MD    Allergies:  Allergies  Allergen Reactions  . Codeine Shortness Of Breath and Itching  . Penicillins Rash  . Sulfa Antibiotics Rash    Past Medical History: Past Medical History  Diagnosis Date  . Type 2 diabetes mellitus   . Essential hypertension, benign   . Pulmonary embolism     Recurrent, previously on Coumadin  . Hiatal hernia   . Ovarian cancer   . COPD (chronic obstructive pulmonary disease)   . NSTEMI (non-ST elevated myocardial infarction)   . Coronary atherosclerosis of native coronary artery     Multivessel, DES distal left main, unsuccessful PCI circumflex 2/13, LVEF 50-55%  . Hypothyroidism   . Mixed hyperlipidemia   . Overweight(278.02)   . Ejection fraction     EF 50-55%, echo, March 15, 2011, question of mild posterior hypokinesis, technically difficult study despite the use of contrast    Past Surgical History  Procedure Laterality Date  . Abdominal surgery    . Cholecystectomy    . Appendectomy    . Cesarean section    . Tonsillectomy    . Breast lumpectomy    . Small intestine surgery  12/2007    Dr [name]  . Closure of abdominal wound/wound vac    . Cataract extraction w/phaco  01/29/2011    Procedure: CATARACT EXTRACTION PHACO AND INTRAOCULAR LENS PLACEMENT (IOC);  Surgeon: [name];  Location: [facility];  Service: Ophthalmology;  Laterality: Left;  CDE: 1.81    Social History: She lives with her son. No smoking, alcohol or illicit drug use. She is able to ambulate on her own.  Family History:   Family History  Problem Relation Age of Onset  . Coronary artery disease Father     MI at age 41  . Heart attack Father   . Coronary artery disease Mother     CABG at age 37  . Heart attack Mother   . Cirrhosis Sister   . Fibromyalgia Sister   . Osteoporosis Sister      Review of Systems - History obtained from the patient General ROS: negative Psychological ROS: negative Ophthalmic ROS: negative ENT ROS: negative Allergy and Immunology ROS: negative Hematological and Lymphatic ROS: negative Endocrine ROS: negative Respiratory ROS: no cough, shortness of breath, or wheezing Cardiovascular ROS: as in hpi Gastrointestinal ROS: as in hpi Genito-Urinary ROS: no dysuria, trouble voiding, or hematuria Musculoskeletal ROS: negative Neurological ROS: no TIA or stroke symptoms Dermatological ROS: negative  Physical Examination  Filed Vitals:   01/31/13 1836 01/31/13 2022  01/31/13 2057 01/31/13 2143  BP: 141/71 166/74 159/81 177/97  Pulse: 85 86 82 91  Temp:    98.4 F (36.9 C)  Resp: '20 18 20 20  ' Height:    '5\' 1"'  (1.549 m)  Weight:    128.368 kg (283 lb)  SpO2: 96% 96% 98% 99%    General appearance: alert, cooperative, appears stated age, no distress and morbidly obese Head: Normocephalic, without obvious abnormality, atraumatic Eyes: conjunctivae/corneas clear. PERRL, EOM's intact.  Throat: lips, mucosa, and tongue normal; teeth and gums normal Neck: no adenopathy, no carotid bruit, no JVD, supple, symmetrical, trachea midline and thyroid not enlarged, symmetric, no tenderness/mass/nodules Resp: clear to auscultation bilaterally Cardio: S1-S2 is normal. Regular. No S3, S4. Systolic murmur appreciated over the precordium. No pedal edema. No rubs, murmurs, or bruit. No JVD. GI: soft, non-tender; bowel sounds normal; no masses,  no organomegaly Extremities: extremities normal, atraumatic, no cyanosis or edema Pulses: 2+ and symmetric Skin: Skin color, texture, turgor  normal. No rashes or lesions Lymph nodes: Cervical, supraclavicular, and axillary nodes normal. Neurologic: Alert and oriented X 3, normal strength and tone. No focal deficits per  Laboratory Data: Results for orders placed during the hospital encounter of 01/31/13 (from the past 48 hour(s))  URINALYSIS, ROUTINE W REFLEX MICROSCOPIC     Status: Abnormal   Collection Time    01/31/13  5:00 PM      Result Value Range   Color, Urine YELLOW  YELLOW   APPearance CLEAR  CLEAR   Specific Gravity, Urine 1.010  1.005 - 1.030   pH 5.5  5.0 - 8.0   Glucose, UA NEGATIVE  NEGATIVE mg/dL   Hgb urine dipstick NEGATIVE  NEGATIVE   Bilirubin Urine NEGATIVE  NEGATIVE   Ketones, ur NEGATIVE  NEGATIVE mg/dL   Protein, ur NEGATIVE  NEGATIVE mg/dL   Urobilinogen, UA 0.2  0.0 - 1.0 mg/dL   Nitrite NEGATIVE  NEGATIVE   Leukocytes, UA MODERATE (*) NEGATIVE  URINE MICROSCOPIC-ADD ON     Status: None   Collection Time    01/31/13  5:00 PM      Result Value Range   Squamous Epithelial / LPF RARE  RARE   WBC, UA 3-6  <3 WBC/hpf   Bacteria, UA RARE  RARE  CBC WITH DIFFERENTIAL     Status: Abnormal   Collection Time    01/31/13  5:05 PM      Result Value Range   WBC 9.4  4.0 - 10.5 K/uL   RBC 4.85  3.87 - 5.11 MIL/uL   Hemoglobin 13.4  12.0 - 15.0 g/dL   HCT 40.7  36.0 - 46.0 %   MCV 83.9  78.0 - 100.0 fL   MCH 27.6  26.0 - 34.0 pg   MCHC 32.9  30.0 - 36.0 g/dL   RDW 13.9  11.5 - 15.5 %   Platelets 250  150 - 400 K/uL   Neutrophils Relative % 79 (*) 43 - 77 %   Neutro Abs 7.5  1.7 - 7.7 K/uL   Lymphocytes Relative 13  12 - 46 %   Lymphs Abs 1.2  0.7 - 4.0 K/uL   Monocytes Relative 6  3 - 12 %   Monocytes Absolute 0.6  0.1 - 1.0 K/uL   Eosinophils Relative 1  0 - 5 %   Eosinophils Absolute 0.1  0.0 - 0.7 K/uL   Basophils Relative 0  0 - 1 %   Basophils Absolute 0.0  0.0 -  0.1 K/uL  BASIC METABOLIC PANEL     Status: Abnormal   Collection Time    01/31/13  5:05 PM      Result Value Range    Sodium 140  137 - 147 mEq/L   Potassium 3.9  3.7 - 5.3 mEq/L   Chloride 100  96 - 112 mEq/L   CO2 24  19 - 32 mEq/L   Glucose, Bld 136 (*) 70 - 99 mg/dL   BUN 27 (*) 6 - 23 mg/dL   Creatinine, Ser 0.94  0.50 - 1.10 mg/dL   Calcium 10.1  8.4 - 10.5 mg/dL   GFR calc non Af Amer 63 (*) >90 mL/min   GFR calc Af Amer 73 (*) >90 mL/min   Comment: (NOTE)     The eGFR has been calculated using the CKD EPI equation.     This calculation has not been validated in all clinical situations.     eGFR's persistently <90 mL/min signify possible Chronic Kidney     Disease.  TROPONIN I     Status: None   Collection Time    01/31/13  5:05 PM      Result Value Range   Troponin I <0.30  <0.30 ng/mL   Comment:            Due to the release kinetics of cTnI,     a negative result within the first hours     of the onset of symptoms does not rule out     myocardial infarction with certainty.     If myocardial infarction is still suspected,     repeat the test at appropriate intervals.  PRO B NATRIURETIC PEPTIDE     Status: Abnormal   Collection Time    01/31/13  5:05 PM      Result Value Range   Pro B Natriuretic peptide (BNP) 440.0 (*) 0 - 125 pg/mL  D-DIMER, QUANTITATIVE     Status: Abnormal   Collection Time    01/31/13  5:05 PM      Result Value Range   D-Dimer, Quant 1.22 (*) 0.00 - 0.48 ug/mL-FEU   Comment:            AT THE INHOUSE ESTABLISHED CUTOFF     VALUE OF 0.48 ug/mL FEU,     THIS ASSAY HAS BEEN DOCUMENTED     IN THE LITERATURE TO HAVE     A SENSITIVITY AND NEGATIVE     PREDICTIVE VALUE OF AT LEAST     98 TO 99%.  THE TEST RESULT     SHOULD BE CORRELATED WITH     AN ASSESSMENT OF THE CLINICAL     PROBABILITY OF DVT / VTE.  TROPONIN I     Status: None   Collection Time    01/31/13  7:04 PM      Result Value Range   Troponin I <0.30  <0.30 ng/mL   Comment:            Due to the release kinetics of cTnI,     a negative result within the first hours     of the onset of  symptoms does not rule out     myocardial infarction with certainty.     If myocardial infarction is still suspected,     repeat the test at appropriate intervals.    Radiology Reports: Dg Chest Portable 1 View  01/31/2013   CLINICAL DATA:  Chest pain, shortness of breath, history diabetes,  hypertension, ovarian cancer, COPD, MI  EXAM: PORTABLE CHEST - 1 VIEW  COMPARISON:  Portable exam 1621 hr compared to 04/21/2011  FINDINGS: Enlargement of cardiac silhouette.  Atherosclerotic calcification aortic arch.  Mediastinal contours and pulmonary vascularity normal.  Lungs clear.  No pleural effusion or pneumothorax.  Bones unremarkable.  IMPRESSION: Enlargement of cardiac silhouette.  No acute abnormalities.   Electronically Signed   By: Lavonia Dana M.D.   On: 01/31/2013 16:31    Electrocardiogram: Sinus rhythm with normal axis. Heart rate is 86 beats per minute. Normal. Intervals. No Q waves. No concerning ST or T-wave changes are noted.  Problem List  Principal Problem:   Chest pain Active Problems:   Type 2 diabetes mellitus   Hiatal hernia   Coronary atherosclerosis of native coronary artery   COPD (chronic obstructive pulmonary disease)   GERD (gastroesophageal reflux disease)   Morbid obesity   Assessment: This is a 63 year old, Caucasian female, presents with nausea and chest pressure. Considering her history of coronary artery disease, angina is in the differential. VTE is in the differential considering her previous history of PE as well. Pain could also be from hiatal hernia as she has been having symptoms of heartburns.  Plan: #1 chest pain with nausea: Troponins are negative x 2. D-dimer is elevated. So, we will proceed with CT of the chest to rule out a pulmonary embolism considering her history. She'll be given GI cocktail and for tonics as well. She'll be observed on telemetry. Due to her history of CAD we will consult cardiology to see her in the morning.  #2 history of  coronary artery disease with drug-eluting stent placed to LAD and distal mainstem in February of 2013: EKG does not show any ischemic changes. Continue with Effient and aspirin. Continue with beta blocker. EKG will be repeated in the morning and troponin will be checked serially.  #3 history of recurrent pulmonary embolism: She was taken off of her Coumadin in February of 2013 when she had the drug-eluting stent placed. But she does have history of recurrent PE whenever she has been taken off of Coumadin in the past. Management as above.  #4 type 2 diabetes mellitus: Sliding scale insulin coverage will be initiated. HbA1c will be checked.  #5 history of GERD: Continue with PPI.  She is noted to have an abnormal UA. However, she is asymptomatic. No role for antibiotics currently.   DVT Prophylaxis: Lovenox Code Status: Full code Family Communication: Discussed with the patient and percent  Disposition Plan: Observed in telemetry   Further management decisions will depend on results of further testing and patient's response to treatment.  [facility]  [facility] Pager [phone]  If 7PM-7AM, please contact night-coverage [email or web address] Password [email or web address]  01/31/2013, 10:03 PM

## 2013-02-01 ENCOUNTER — Telehealth: Payer: Self-pay | Admitting: Cardiology

## 2013-02-01 DIAGNOSIS — J449 Chronic obstructive pulmonary disease, unspecified: Secondary | ICD-10-CM

## 2013-02-01 DIAGNOSIS — J4489 Other specified chronic obstructive pulmonary disease: Secondary | ICD-10-CM

## 2013-02-01 DIAGNOSIS — I10 Essential (primary) hypertension: Secondary | ICD-10-CM

## 2013-02-01 LAB — COMPREHENSIVE METABOLIC PANEL
ALBUMIN: 3.4 g/dL — AB (ref 3.5–5.2)
ALT: 15 U/L (ref 0–35)
AST: 15 U/L (ref 0–37)
Alkaline Phosphatase: 46 U/L (ref 39–117)
BUN: 21 mg/dL (ref 6–23)
CALCIUM: 9 mg/dL (ref 8.4–10.5)
CO2: 28 mEq/L (ref 19–32)
Chloride: 99 mEq/L (ref 96–112)
Creatinine, Ser: 0.9 mg/dL (ref 0.50–1.10)
GFR calc non Af Amer: 66 mL/min — ABNORMAL LOW (ref >90)
GFR, EST AFRICAN AMERICAN: 77 mL/min — AB (ref >90)
GLUCOSE: 116 mg/dL — AB (ref 70–99)
Potassium: 3.9 mEq/L (ref 3.7–5.3)
Sodium: 137 mEq/L (ref 137–147)
TOTAL PROTEIN: 6.5 g/dL (ref 6.0–8.3)
Total Bilirubin: 0.3 mg/dL (ref 0.3–1.2)

## 2013-02-01 LAB — CBC
HEMATOCRIT: 37.2 % (ref 36.0–46.0)
HEMOGLOBIN: 12.3 g/dL (ref 12.0–15.0)
MCH: 27.8 pg (ref 26.0–34.0)
MCHC: 33.1 g/dL (ref 30.0–36.0)
MCV: 84 fL (ref 78.0–100.0)
Platelets: 232 10*3/uL (ref 150–400)
RBC: 4.43 MIL/uL (ref 3.87–5.11)
RDW: 14.1 % (ref 11.5–15.5)
WBC: 7.8 10*3/uL (ref 4.0–10.5)

## 2013-02-01 LAB — GLUCOSE, CAPILLARY
GLUCOSE-CAPILLARY: 123 mg/dL — AB (ref 70–99)
GLUCOSE-CAPILLARY: 120 mg/dL — AB (ref 70–99)
Glucose-Capillary: 142 mg/dL — ABNORMAL HIGH (ref 70–99)
Glucose-Capillary: 111 mg/dL — ABNORMAL HIGH (ref 70–99)
Glucose-Capillary: 98 mg/dL (ref 70–99)

## 2013-02-01 LAB — HEMOGLOBIN A1C
HEMOGLOBIN A1C: 6.1 % — AB (ref <5.7)
MEAN PLASMA GLUCOSE: 128 mg/dL — AB (ref <117)

## 2013-02-01 LAB — PROTIME-INR
INR: 1.08 (ref 0.00–1.49)
Prothrombin Time: 13.8 seconds (ref 11.6–15.2)

## 2013-02-01 LAB — TROPONIN I
Troponin I: <0.3 ng/mL (ref <0.30)
Troponin I: <0.3 ng/mL (ref <0.30)

## 2013-02-01 MED ORDER — ASPIRIN EC 81 MG PO TBEC
81.0000 mg | DELAYED_RELEASE_TABLET | Freq: Every day | ORAL | Status: DC
  Administered 2013-02-02: 81 mg via ORAL
  Filled 2013-02-01: qty 1

## 2013-02-01 MED ORDER — TRIAMTERENE-HCTZ 37.5-25 MG PO TABS
1.0000 | ORAL_TABLET | Freq: Every day | ORAL | Status: DC
  Administered 2013-02-01 – 2013-02-02 (×2): 1 via ORAL
  Filled 2013-02-01: qty 1

## 2013-02-01 MED ORDER — IOHEXOL 350 MG/ML SOLN
100.0000 mL | Freq: Once | INTRAVENOUS | Status: AC | PRN
  Administered 2013-02-01: 100 mL via INTRAVENOUS

## 2013-02-01 MED ORDER — BIOTENE DRY MOUTH MT LIQD
15.0000 mL | Freq: Two times a day (BID) | OROMUCOSAL | Status: DC
Start: 2013-02-01 — End: 2013-02-02
  Administered 2013-02-01 – 2013-02-02 (×3): 15 mL via OROMUCOSAL

## 2013-02-01 MED ORDER — GI COCKTAIL ~~LOC~~
30.0000 mL | Freq: Four times a day (QID) | ORAL | Status: DC | PRN
  Administered 2013-02-01: 30 mL via ORAL
  Filled 2013-02-01: qty 30

## 2013-02-01 MED ORDER — REGADENOSON 0.4 MG/5ML IV SOLN
0.4000 mg | Freq: Once | INTRAVENOUS | Status: DC
Start: 2013-02-02 — End: 2013-02-02

## 2013-02-01 NOTE — Progress Notes (Signed)
[facility] PROGRESS NOTE  [name] VFI:[id number] DOB: 10-Jun-1950 DOA: 01/31/2013 PCP: Delphina Cahill, MD  Assessment/Plan: 1. Chest pain.  CTA chest neg for PE. Cardiac enzymes negative.  She has known CAD.  Cardiology following and is planning stress test in am. 2. History of CAD. Continue effient and aspirin 3. Type 2 DM. On sliding scale insulin 4. GERD. On PPI and GI cocktail.  Code Status: full code Family Communication: discussed with patient Disposition Plan: discharge homeonce improved   Consultants:  Cardiology  Procedures:  none  Antibiotics:  none  HPI/Subjective: No further chest pain or shortness of breath  Objective: Filed Vitals:   02/01/13 1459  BP: 149/78  Pulse: 66  Temp: 97.9 F (36.6 C)  Resp: 20    Intake/Output Summary (Last 24 hours) at 02/01/13 1722 Last data filed at 02/01/13 1254  Gross per 24 hour  Intake    723 ml  Output    550 ml  Net    173 ml   Filed Weights   01/31/13 1611 01/31/13 2143  Weight: 128.368 kg (283 lb) 128.368 kg (283 lb)    Exam:   General:  NAD  Cardiovascular: S1, S2 RRR  Respiratory: CTA B  Abdomen: soft, nt, obese, bs+  Musculoskeletal: no edema b/l   Data Reviewed: Basic Metabolic Panel:  Recent Labs Lab 01/31/13 1705 02/01/13 0337  NA 140 137  K 3.9 3.9  CL 100 99  CO2 24 28  GLUCOSE 136* 116*  BUN 27* 21  CREATININE 0.94 0.90  CALCIUM 10.1 9.0   Liver Function Tests:  Recent Labs Lab 02/01/13 0337  AST 15  ALT 15  ALKPHOS 46  BILITOT 0.3  PROT 6.5  ALBUMIN 3.4*   No results found for this basename: LIPASE, AMYLASE,  in the last 168 hours No results found for this basename: AMMONIA,  in the last 168 hours CBC:  Recent Labs Lab 01/31/13 1705 02/01/13 0337  WBC 9.4 7.8  NEUTROABS 7.5  --   HGB 13.4 12.3  HCT 40.7 37.2  MCV 83.9 84.0  PLT 250 232   Cardiac Enzymes:  Recent Labs Lab 01/31/13 1705 01/31/13 1904 01/31/13 2222 02/01/13 0337  02/01/13 1023  TROPONINI <0.30 <0.30 <0.30 <0.30 <0.30   BNP (last 3 results)  Recent Labs  01/31/13 1705  PROBNP 440.0*   CBG:  Recent Labs Lab 01/31/13 2328 02/01/13 0747 02/01/13 1136 02/01/13 1647  GLUCAP 142* 111* 123* 98    No results found for this or any previous visit (from the past 240 hour(s)).   Studies: Ct Angio Chest Pe W/cm &/or Wo Cm  02/01/2013   CLINICAL DATA:  Chest pain, nausea, hypertension, shortness of breath. History of ovarian cancer. Evaluate for pulmonary embolism.  EXAM: CT ANGIOGRAPHY CHEST WITH CONTRAST  TECHNIQUE: Multidetector CT imaging of the chest was performed using the standard protocol during bolus administration of intravenous contrast. Multiplanar CT image reconstructions including MIPs were obtained to evaluate the vascular anatomy.  CONTRAST:  141mL OMNIPAQUE IOHEXOL 350 MG/ML SOLN  COMPARISON:  CT of chest March 12, 2011 and chest radiograph January 31, 2013.  FINDINGS: Larger body habitus results in degraded image quality with noisy images.  No pulmonary arterial filling defects to the level of the subsegmental branches. Main pulmonary artery is not enlarged.  The heart is mildly enlarged, with similar Coronary artery calcifications. Pericardium is unremarkable. Thoracic aorta is normal course and caliber. Mild calcific atherosclerosis at the arch.  Stable lingular  scarring with minimal dependent atelectasis. Stable 3 mm right upper lobe pulmonary nodule, coronal 58/85. No new pulmonary nodules. No masses. No focal consolidations. Tracheobronchial tree is patent, midline. No pneumothorax.  Greater than expected number of non pathologically enlarged mediastinal lymph nodes are relatively unchanged. Thoracic esophagus is unremarkable, with exception of small hiatal hernia. Fatty liver, the with included view of the abdomen is otherwise unremarkable. Moderate degenerative changes of thoracic spine. Mild left thyromegaly with substernal extent again  noted.  Review of the MIP images confirms the above findings.  IMPRESSION: No pulmonary embolism nor acute pulmonary process.  Stable cardiomegaly.   Electronically Signed   By: Elon Alas   On: 02/01/2013 02:21   Dg Chest Portable 1 View  01/31/2013   CLINICAL DATA:  Chest pain, shortness of breath, history diabetes, hypertension, ovarian cancer, COPD, MI  EXAM: PORTABLE CHEST - 1 VIEW  COMPARISON:  Portable exam 1621 hr compared to 04/21/2011  FINDINGS: Enlargement of cardiac silhouette.  Atherosclerotic calcification aortic arch.  Mediastinal contours and pulmonary vascularity normal.  Lungs clear.  No pleural effusion or pneumothorax.  Bones unremarkable.  IMPRESSION: Enlargement of cardiac silhouette.  No acute abnormalities.   Electronically Signed   By: Lavonia Dana M.D.   On: 01/31/2013 16:31    Scheduled Meds: . amLODipine  10 mg Oral Daily  . antiseptic oral rinse  15 mL Mouth Rinse BID  . [START ON 02/02/2013] aspirin EC  81 mg Oral Daily  . atorvastatin  40 mg Oral q1800  . carvedilol  3.125 mg Oral BID WC  . docusate sodium  100 mg Oral BID  . enoxaparin (LOVENOX) injection  60 mg Subcutaneous QHS  . HYDROcodone-acetaminophen  1 tablet Oral BID  . insulin aspart  0-20 Units Subcutaneous TID WC  . insulin aspart  0-5 Units Subcutaneous QHS  . irbesartan  300 mg Oral Daily  . levothyroxine  150 mcg Oral QAC breakfast  . meclizine  25 mg Oral BID  . mometasone-formoterol  2 puff Inhalation BID  . pantoprazole  80 mg Oral BID AC  . prasugrel  10 mg Oral q morning - 10a  . [START ON 02/02/2013] regadenoson  0.4 mg Intravenous Once  . sodium chloride  3 mL Intravenous Q12H  . sodium chloride  3 mL Intravenous Q12H  . triamterene-hydrochlorothiazide  1 tablet Oral QAC breakfast   Continuous Infusions:   Principal Problem:   Chest pain Active Problems:   Type 2 diabetes mellitus   Hiatal hernia   Coronary atherosclerosis of native coronary artery   COPD (chronic obstructive  pulmonary disease)   GERD (gastroesophageal reflux disease)   Morbid obesity    Time spent: 66mins    [name]  [facility] Pager [phone]. If 7PM-7AM, please contact night-coverage at [email or web address], password [email or web address] 02/01/2013, 5:22 PM  LOS: 1 day

## 2013-02-01 NOTE — Progress Notes (Signed)
UR completed 

## 2013-02-01 NOTE — Telephone Encounter (Signed)
Received fax refill request  Rx # [id number] Medication:  Pantoprazole SOD DR 40 mg tab Qty 120 Sig:  Take two tablets ( 80 mg total) by mouth 2 (two) times daily before a meal Physician:  Domenic Polite

## 2013-02-01 NOTE — Progress Notes (Signed)
Nutrition Brief Note  Patient identified on the Malnutrition Screening Tool (MST) Report  Wt Readings from Last 15 Encounters:  01/31/13 283 lb (128.368 kg)  08/04/12 296 lb (134.265 kg)  02/02/12 302 lb 8 oz (137.213 kg)  07/29/11 310 lb (140.615 kg)  05/01/11 309 lb (140.161 kg)  04/22/11 309 lb 15.5 oz (140.6 kg)  04/03/11 309 lb (140.161 kg)  03/20/11 323 lb 3.1 oz (146.6 kg)  03/20/11 323 lb 3.1 oz (146.6 kg)  03/20/11 323 lb 3.1 oz (146.6 kg)  01/21/11 320 lb (145.151 kg)  08/08/10 330 lb 9.6 oz (149.959 kg)    Body mass index is 53.5 kg/(m^2). Patient meets criteria for obesity class III based on current BMI. She reports body of of 400# at one time. Over time she has continued to gradually reduce her wt. Reports good appetite until yesterday but says she's feeling much better today.  Current diet order is CHO Modified. Labs and medications reviewed.   Recommend pt continue with lifestyle modifications for continued gradual wt loss.   Colman Cater MS,RD,CSG,LDN Office: [phone] Pager: [phone]

## 2013-02-01 NOTE — Consult Note (Signed)
CARDIOLOGY CONSULT NOTE   Patient ID: [name] MRN: [id number] DOB/AGE: 08-10-1950 63 y.o.  Admit Date: 01/31/2013 Referring Physician: PTH Primary Physician: Delphina Cahill, MD Consulting Cardiologist: Carlyle Dolly Primary Cardiologist: [name] MD Reason for Consultation: Chest Pain with known CAD  Clinical Summary [name] is a 63 y.o.female with known history of  CAD, with DES to LAD in 2013, admitted with chest pressure and shortness of breath with associated nausea and diaphoreisis. He also complained of some heartburn symptoms. She states it started yesterday afternoon lasted several hours. She took a nitroglycerin with no relief. The pain radiated into her back with a burning feeling. She checked her blood pressure and found it to be elevated at 169/88. She states that she normally has a blood pressure in the 135/65 range. On arrival to emergency the patient's blood pressure was 156/86 with a heart rate of 89 O2 sat 97% with respirations of 20 she was afebrile. EKG revealed normal sinus rhythm without acute changes.     She has a history of PE and stated that it felt like it did with previous PE. D-dimer was mildly elevated. CT scan of the chest was completed and was negative for PE. Troponin negative X 3. Pro BNP 440. She was treated with nitroglycerin sublingual x2, morphine 2 mg, and a GI cocktail. The pain eventually subsided on arrival to her hospital room. She has had no recurrence since.   On further questioning, the patient's angina equivalent is jaw pain on the left, with palpitations and lightheadedness. She has had no recurrence of these symptoms since PCI in February 2013. She has been lost to followup with Dr. Domenic Polite, secondary to the death of her husband missing appointments. She is requesting to be reestablished.   Allergies  Allergen Reactions  . Codeine Shortness Of Breath and Itching  . Penicillins Rash  . Sulfa Antibiotics Rash     Medications Scheduled Medications: . amLODipine  10 mg Oral Daily  . antiseptic oral rinse  15 mL Mouth Rinse BID  . aspirin EC  325 mg Oral Daily  . atorvastatin  40 mg Oral q1800  . carvedilol  3.125 mg Oral BID WC  . docusate sodium  100 mg Oral BID  . enoxaparin (LOVENOX) injection  60 mg Subcutaneous QHS  . HYDROcodone-acetaminophen  1 tablet Oral BID  . insulin aspart  0-20 Units Subcutaneous TID WC  . insulin aspart  0-5 Units Subcutaneous QHS  . irbesartan  300 mg Oral Daily  . levothyroxine  150 mcg Oral QAC breakfast  . meclizine  25 mg Oral BID  . mometasone-formoterol  2 puff Inhalation BID  . pantoprazole  80 mg Oral BID AC  . prasugrel  10 mg Oral q morning - 10a  . sodium chloride  3 mL Intravenous Q12H  . sodium chloride  3 mL Intravenous Q12H  . triamterene-hydrochlorothiazide  1 tablet Oral QAC breakfast   PRN Medications: sodium chloride, acetaminophen, acetaminophen, ALPRAZolam, morphine injection, ondansetron (ZOFRAN) IV, ondansetron, sodium chloride   Past Medical History  Diagnosis Date  . Type 2 diabetes mellitus   . Essential hypertension, benign   . Pulmonary embolism     Recurrent, previously on Coumadin  . Hiatal hernia   . Ovarian cancer   . COPD (chronic obstructive pulmonary disease)   . NSTEMI (non-ST elevated myocardial infarction)   . Coronary atherosclerosis of native coronary artery     Multivessel, DES distal left main, unsuccessful PCI circumflex  2/13, LVEF 50-55%  . Hypothyroidism   . Mixed hyperlipidemia   . Overweight(278.02)   . Ejection fraction     EF 50-55%, echo, March 15, 2011, question of mild posterior hypokinesis, technically difficult study despite the use of contrast    Past Surgical History  Procedure Laterality Date  . Abdominal surgery    . Cholecystectomy    . Appendectomy    . Cesarean section    . Tonsillectomy    . Breast lumpectomy    . Small intestine surgery  12/2007    Dr Lovell Sheehan  . Closure  of abdominal wound/wound vac    . Cataract extraction w/phaco  01/29/2011    Procedure: CATARACT EXTRACTION PHACO AND INTRAOCULAR LENS PLACEMENT (IOC);  Surgeon: Gemma Payor;  Location: [facility];  Service: Ophthalmology;  Laterality: Left;  CDE: 1.81    Family History  Problem Relation Age of Onset  . Coronary artery disease Father     MI at age 77  . Heart attack Father   . Coronary artery disease Mother     CABG at age 38  . Heart attack Mother   . Cirrhosis Sister   . Fibromyalgia Sister   . Osteoporosis Sister     Social History [name] reports that she quit smoking about 33 years ago. Her smoking use included Cigarettes. She smoked 0.00 packs per day. She has never used smokeless tobacco. [name] reports that she does not drink alcohol.  Review of Systems Otherwise reviewed and negative except as outlined.  Physical Examination Blood pressure 177/97, pulse 91, temperature 98.4 F (36.9 C), temperature source Oral, resp. rate 20, height 5\' 1"  (1.549 m), weight 283 lb (128.368 kg), SpO2 97.00%.  Intake/Output Summary (Last 24 hours) at 02/01/13 0855 Last data filed at 02/01/13 0837  Gross per 24 hour  Intake    483 ml  Output    300 ml  Net    183 ml    Telemetry:  HEENT: Conjunctiva and lids normal, oropharynx clear with moist mucosa. Neck: Supple, no elevated JVP or carotid bruits, no thyromegaly. Lungs: Clear to auscultation, nonlabored breathing at rest. Cardiac: Regular rate and rhythm, no S3 or significant systolic murmur, no pericardial rub. Abdomen: Soft, nontender, no hepatomegaly, bowel sounds present, no guarding or rebound. Extremities: No pitting edema, distal pulses 2+. Skin: Warm and dry. Musculoskeletal: No kyphosis. Neuropsychiatric: Alert and oriented x3, affect grossly appropriate.  Prior Cardiac Testing/Procedures 1. Cardiac Cath: 03/12/2011 Coronary angiography:  Coronary dominance: right  Left mainstem: 70-80% distal LM stenosis.   Left anterior descending (LAD): 80% ostial LAD stenosis. Luminal irregularities in the remainder of the LAD.  Left circumflex (LCx): There was a small ramus. 95% ostial LCx stenosis. The AV LCx was subtotally occluded after the moderate 1st obtuse marginal.  Right coronary artery (RCA): Diffuse disease in the mid RCA up to about 50% at the takeoff of the 2nd acute marginal. 50% proximal PDA stenosis.  Left ventriculography: Left ventricular systolic function is normal, LVEF is estimated at 60%.  Final Conclusions: There is complex plaque involving the distal left main, ostial LCx, and ostial LAD. The mid LCx is subtotally occluded. The LCx disease is likely the culprit for NSTEMI. She is no longer having chest pain. EF is preserved.  Recommendations: Not a good target for percutaneous revascularization. However, she is also a high risk candidate for CABG.  PCI:03/12/2011 Lesion Data:  Vessel: Left Main-distal  Percent stenosis (pre): 80  TIMI-flow (pre): 3  Stent: 4.0 x 16 mm Promus element  Percent stenosis (post):10  TIMI-flow (post): 3  Conclusions: Successful stenting of the distal left mainstem and of the LAD. Occlusion of the left circumflex at the procedure completion.  3. Echocardiogram:02/2011 Left ventricle: Possible mild posterior hypokinesis. However, even with contrast injection posterior wall difficult to visualize. No other obvious segmental wall motion abnormalities. The cavity size was normal. Wall thickness was normal. Systolic function was normal. The estimated ejection fraction was in the range of 50% to 55%. Left ventricular diastolic function parameters were normal. - Aortic valve: Valve area: 1.88cm^2(VTI). Valve area: 1.88cm^2 (Vmax).   Lab Results  Basic Metabolic Panel:  Recent Labs Lab 01/31/13 1705 02/01/13 0337  NA 140 137  K 3.9 3.9  CL 100 99  CO2 24 28  GLUCOSE 136* 116*  BUN 27* 21  CREATININE 0.94 0.90  CALCIUM 10.1 9.0    Liver  Function Tests:  Recent Labs Lab 02/01/13 0337  AST 15  ALT 15  ALKPHOS 46  BILITOT 0.3  PROT 6.5  ALBUMIN 3.4*    CBC:  Recent Labs Lab 01/31/13 1705 02/01/13 0337  WBC 9.4 7.8  NEUTROABS 7.5  --   HGB 13.4 12.3  HCT 40.7 37.2  MCV 83.9 84.0  PLT 250 232    Cardiac Enzymes:  Recent Labs Lab 01/31/13 1705 01/31/13 1904 01/31/13 2222 02/01/13 0337  TROPONINI <0.30 <0.30 <0.30 <0.30    Radiology: Ct Angio Chest Pe W/cm &/or Wo Cm  02/01/2013   CLINICAL DATA:  Chest pain, nausea, hypertension, shortness of breath. History of ovarian cancer. Evaluate for pulmonary embolism.  EXAM: CT ANGIOGRAPHY CHEST WITH CONTRAST  .  IMPRESSION: No pulmonary embolism nor acute pulmonary process.  Stable cardiomegaly.   Electronically Signed   By: Elon Alas   On: 02/01/2013 02:21   Dg Chest Portable 1 View  01/31/2013   CLINICAL DATA:  Chest pain, shortness of breath, history diabetes, hypertension, ovarian cancer, COPD, MI  EXAM: PORTABLE CHEST - 1 VIEW  COMPARISON:  Portable exam 1621 hr compared to 04/21/2011  FINDINGS: Enlargement of cardiac silhouette.  Atherosclerotic calcification aortic arch.  Mediastinal contours and pulmonary vascularity normal.  Lungs clear.  No pleural effusion or pneumothorax.  Bones unremarkable.  IMPRESSION: Enlargement of cardiac silhouette.  No acute abnormalities.   Electronically Signed   By: Lavonia Dana M.D.   On: 01/31/2013 16:31   ECG:NSR without evidence of ischemia   Impression and Recommendations  1. Chest Pain: Known history of CAD, with PCI to the LAD in February of 2013, with burning sensation in the left chest radiating to the back with associated diaphoresis. This is not similar to the pain she experienced prior to PCI which is described as left-sided jaw pain and lightheadedness with palpitations. Cardiac markers are negative x3 arguing against ACS. EKG reveals normal sinus rhythm. Doubt cardiac etiology for chest discomfort. It is  now completely resolved. - plan to risk stratify with non-invasive stress testing.   2. Diabetes: Patient had mildly elevated glucose on admission. Hemoglobin A1c has not been ordered yet. Better controlled but not optimal during hospitalization.   3. History of GERD:  Would consider GI workup eventually if she continues to have recurrent heartburn symptoms with known history of hiatal hernia.    Signed: Phill Myron. Purcell Nails NP [facility] 02/01/2013, 8:55 AM Co-Sign MD  Attending Note  63 yo female with history of severe multivessel CAD by cath 02/2011 including distal  left main and proximal LAD, was considered too high risk for CABG and thus underwent high risk PCI. She was stented in the distal left main and proximal LAD, unable to cross LCX lesion despite several attempts, RCA disease at that time was 50% and medically managed. She has been committed to lifetime DAP[facility] and reports compliance. She has history of multiple PEs and had been on coumadin, when she was committed to DAPT coumadin was stopped. She presents with episode of chest pain yesterday afternoon. Described as a pressure/burning in midchest 5/10 with some lightheadedness, felt nauseous and diaphoretic. Took NG with some relieft, but not resolution of pain. Pain lasted approx 2 hours and then resolved in the ER. She states this is different from prior angina, denies any similar symptoms since her PCI in 02/2011. Her EKG shows old non-specific ST/T changes, troponins are negative, CT PE has been negative. From a symptoms standpoint they are mixed for possible cardiac chest pain. Given her high risk anatomy including distal LM, prox LAD, and severe LCX disease she does warrant further risk stratification in the setting of these symptoms. Will obtain a lexiscan to evaluate for ischemia and further risk stratify, she has eaten this morning so will order for tomorrow AM. Will make NPO at midnight, continue other current medications. Will  add fasting lipid panel for AM as well. Continue PPI.

## 2013-02-01 NOTE — Telephone Encounter (Signed)
Medication Denied. Needs to be refilled by provider that filled this medication.

## 2013-02-02 ENCOUNTER — Observation Stay (HOSPITAL_COMMUNITY): Payer: 59

## 2013-02-02 ENCOUNTER — Encounter (HOSPITAL_COMMUNITY): Payer: Self-pay

## 2013-02-02 ENCOUNTER — Telehealth: Payer: Self-pay

## 2013-02-02 DIAGNOSIS — R079 Chest pain, unspecified: Secondary | ICD-10-CM

## 2013-02-02 LAB — GLUCOSE, CAPILLARY
GLUCOSE-CAPILLARY: 115 mg/dL — AB (ref 70–99)
GLUCOSE-CAPILLARY: 179 mg/dL — AB (ref 70–99)

## 2013-02-02 LAB — LIPID PANEL
CHOL/HDL RATIO: 3.3 ratio
CHOLESTEROL: 176 mg/dL (ref 0–200)
HDL: 53 mg/dL (ref >39)
LDL Cholesterol: 98 mg/dL (ref 0–99)
Triglycerides: 126 mg/dL (ref <150)
VLDL: 25 mg/dL (ref 0–40)

## 2013-02-02 MED ORDER — ASPIRIN 81 MG PO TBEC: 81.0000 mg | DELAYED_RELEASE_TABLET | Freq: Every day | ORAL | Status: DC

## 2013-02-02 MED ORDER — ASPIRIN EC 81 MG PO TBEC: 81.0000 mg | DELAYED_RELEASE_TABLET | Freq: Every day | ORAL | Status: DC

## 2013-02-02 MED ORDER — ATORVASTATIN CALCIUM 40 MG PO TABS: 40.0000 mg | ORAL_TABLET | Freq: Every day | ORAL | Status: DC

## 2013-02-02 MED ORDER — SODIUM CHLORIDE 0.9 % IJ SOLN
INTRAMUSCULAR | Status: AC
Start: 2013-02-02 — End: 2013-02-02
  Administered 2013-02-02: 10 mL via INTRAVENOUS
  Filled 2013-02-02: qty 10

## 2013-02-02 MED ORDER — ROSUVASTATIN CALCIUM 20 MG PO TABS: 40.0000 mg | ORAL_TABLET | Freq: Every day | ORAL | Status: DC

## 2013-02-02 MED ORDER — TECHNETIUM TC 99M SESTAMIBI - CARDIOLITE
30.0000 | Freq: Once | INTRAVENOUS | Status: AC | PRN
  Administered 2013-02-02: 30 via INTRAVENOUS

## 2013-02-02 MED ORDER — REGADENOSON 0.4 MG/5ML IV SOLN
INTRAVENOUS | Status: AC
  Administered 2013-02-02: 0.4 mg via INTRAVENOUS
  Filled 2013-02-02: qty 5

## 2013-02-02 MED ORDER — CARVEDILOL 3.125 MG PO TABS: 6.2500 mg | ORAL_TABLET | Freq: Two times a day (BID) | ORAL | Status: DC

## 2013-02-02 MED ORDER — PANTOPRAZOLE SODIUM 40 MG PO TBEC: 80.0000 mg | DELAYED_RELEASE_TABLET | Freq: Two times a day (BID) | ORAL | Status: DC

## 2013-02-02 MED ORDER — TECHNETIUM TC 99M SESTAMIBI - CARDIOLITE
10.0000 | Freq: Once | INTRAVENOUS | Status: AC | PRN
  Administered 2013-02-02: 10 via INTRAVENOUS

## 2013-02-02 MED ORDER — CARVEDILOL 6.25 MG PO TABS: 6.2500 mg | ORAL_TABLET | Freq: Two times a day (BID) | ORAL | Status: DC

## 2013-02-02 NOTE — Progress Notes (Signed)
Late Entry:  AVS reviewed with patient.  Patient verbalized understanding of discharge instructions.  Patient's IV removed.  Site WNL.  Patient reports all belongings intact and in possession at discharge.  Patient transported by NT via w/c to main entrance for discharge. Patient stable at time of discharge

## 2013-02-02 NOTE — Discharge Summary (Signed)
Patient seen and examined. Agree with note as above.  Patient admitted for chest pain.  Cardiology saw patient and ordered stress test.  Results of stress test as above.  Recommendations are for further medical management of CAD. She is chest pain free.  Follow up with cardiology as an outpatient.  MEMON,JEHANZEB

## 2013-02-02 NOTE — Progress Notes (Signed)
Stress Lab Nurses Notes - [facility] 02/02/2013 Reason for doing test: CAD and Chest Pain Type of test: Wille Glaser Nurse performing test: Gerrit Halls, RN Nuclear Medicine Tech: Dyanne Carrel Echo Tech: Not Applicable MD performing test: Dr. Harl Bowie / K.Lawrence NP Family MD: Wende Neighbors Test explained and consent signed: yes IV started: 20g jelco, Saline lock flushed, No redness or edema and Saline lock from floor Symptoms: Chest tightness Treatment/Intervention: None Reason test stopped: protocol completed After recovery IV was: No redness or edema and Saline Lock flushed Patient to return to [facility]. Med at : 9:30 Patient discharged: Transported back to room 301 via wc Patient's Condition upon discharge was: stable Comments: During test BP 122/80 & HR 101.  Recovery BP 100/81 & HR 78.  Symptoms resolved in recovery. Geanie Cooley T

## 2013-02-02 NOTE — Discharge Summary (Signed)
Physician Discharge Summary  [name] GNF:[id number] DOB: [birth year] DOA: 01/31/2013  PCP: Delphina Cahill, MD  Admit date: 01/31/2013 Discharge date: 02/02/2013  Time spent: 40 minutes  Recommendations for Outpatient Follow-up:  Dr. Domenic Polite in 3 weeks PCP 1-2 weeks for evaluation of symptoms as well as monitoring glucose  Discharge Diagnoses:  Principal Problem:   Chest pain Active Problems:   Type 2 diabetes mellitus   Hiatal hernia   Coronary atherosclerosis of native coronary artery   COPD (chronic obstructive pulmonary disease)   GERD (gastroesophageal reflux disease)   Morbid obesity   Discharge Condition: stable  Diet recommendation: carb modified  Filed Weights   01/31/13 1611 01/31/13 2143  Weight: 128.368 kg (283 lb) 128.368 kg (283 lb)    History of present illness:  [name] is a 63 y.o. female with a past medical history of CAD with a PTCI to distal left mainstem and the LAD in February of 2013 with a drug-eluting stent. She also has a history of morbid obesity, diabetes mellitus, type II, history of recurrent pulmonary embolism, hiatal hernia, and who was in her usual state of health till 01/30/13 when she started feeling nauseous and had started having sweating episodes. She noted that her BP was high. This was present along with a pressure-like sensation in the lower part of her chest which was 5/10 in intensity. She had the minimal shortness of breath, but denied any dizziness. Denied any vomiting. No palpitations. Denied any leg swelling. No cough. She felt, that the pain radiated to the back. The pain currently,was 2/10 on admission. She also reported that the pain felt like her previous history of pulmonary embolism. No fever. No chills. She did have a mild headache. Denied any sick contacts recently. She took nitroglycerin at home with no relief. She was given nitroglycerin in the ED, with partial relief of her discomfort. She also reported  heartburn all day.  Hospital Course:  1. Chest pain. CTA chest neg for PE. Cardiac enzymes negative. She has known CAD. Stress test in 02/02/13 yielding fixed apical and anteroseptal scar. Show moderate sized partially reversible lateral defect consistent with LCX disease that was not able to be opened up, normal LVEF. Overall intermediate risk study per cardiology. Recommendation per cardiology is to maximize medical therapy. Coreg changed to 6.25mg  BID and crestor increased to 40mg . Will follow up with Dr. Domenic Polite in 3 weeks. At discharge patient is chest pain free.   History of CAD. Continue effient and aspirin  Type 2 DM. On metformin and glipizide combination at home. Metformin on hold until 02/03/13 at 2am due to contrast consumed for CT. Will resume home regimen 02/03/13. A1C 6.1  GERD. Continue PPI  Procedures: Stress test 02/02/13 Consultations:  Dr. Harl Bowie cardiology  Discharge Exam: Filed Vitals:   02/02/13 1226  BP: 139/82  Pulse: 67  Temp:   Resp:     General: obese comfortable  Cardiovascular: RRR No MGR trace LE edema PPP Respiratory: normal effort BS clear bilaterally to auscultation  Discharge Instructions       Future Appointments Provider Department Dept Phone   02/07/2013 2:00 PM Satira Sark, MD [facility] [place] [phone]       Medication List    STOP taking these medications       rosuvastatin 20 MG tablet  Commonly known as:  CRESTOR  Replaced by:  atorvastatin 40 MG tablet      TAKE these medications  albuterol (2.5 MG/3ML) 0.083% nebulizer solution  Commonly known as:  PROVENTIL  Take 2.5 mg by nebulization every 6 (six) hours as needed. For wheezing. Combines with atrovent     ALPRAZolam 1 MG tablet  Commonly known as:  XANAX  Take 1 mg by mouth at bedtime as needed for anxiety or sleep.     amLODipine 10 MG tablet  Commonly known as:  NORVASC  Take 10 mg by mouth daily.     aspirin EC 81 MG tablet  Take 1 tablet  (81 mg total) by mouth daily.     atorvastatin 40 MG tablet  Commonly known as:  LIPITOR  Take 1 tablet (40 mg total) by mouth daily at 6 PM.     calcium-vitamin D 500-200 MG-UNIT per tablet  Commonly known as:  OSCAL WITH D  Take 1 tablet by mouth daily.     carvedilol 6.25 MG tablet  Commonly known as:  COREG  Take 1 tablet (6.25 mg total) by mouth 2 (two) times daily with a meal.     Fluticasone-Salmeterol 250-50 MCG/DOSE Aepb  Commonly known as:  ADVAIR  Inhale 1 puff into the lungs 2 (two) times daily.     glipiZIDE-metformin 2.5-500 MG per tablet  Commonly known as:  METAGLIP  Take 1 tablet by mouth 2 (two) times daily before a meal. Please hold your Metformin for 48hrs after procedure. Resume on 03/21/11     HYDROcodone-acetaminophen 5-325 MG per tablet  Commonly known as:  NORCO/VICODIN  Take 1 tablet by mouth 2 (two) times daily. For pain     ipratropium 0.02 % nebulizer solution  Commonly known as:  ATROVENT  Take 500 mcg by nebulization every 6 (six) hours as needed. For shortness of breath. Combines it with Albuterol     Iron 325 (65 FE) MG Tabs  Take 1 tablet by mouth daily after breakfast.     levothyroxine 150 MCG tablet  Commonly known as:  SYNTHROID, LEVOTHROID  Take 1 tablet (150 mcg total) by mouth every morning.     loratadine 10 MG tablet  Commonly known as:  CLARITIN  Take 10 mg by mouth every morning.     meclizine 25 MG tablet  Commonly known as:  ANTIVERT  Take 25 mg by mouth 2 (two) times daily.     nitroGLYCERIN 0.4 MG SL tablet  Commonly known as:  NITROSTAT  Place 1 tablet (0.4 mg total) under the tongue every 5 (five) minutes as needed for chest pain (up to 3 doses).     olmesartan 40 MG tablet  Commonly known as:  BENICAR  Take 40 mg by mouth every morning.     ONGLYZA 5 MG Tabs tablet  Generic drug:  saxagliptin HCl  Take 5 mg by mouth every morning.     pantoprazole 40 MG tablet  Commonly known as:  PROTONIX  Take 2 tablets  (80 mg total) by mouth 2 (two) times daily before a meal.     prasugrel 10 MG Tabs tablet  Commonly known as:  EFFIENT  Take 10 mg by mouth every morning.     prenatal multivitamin Tabs tablet  Take 1 tablet by mouth daily.     triamterene-hydrochlorothiazide 37.5-25 MG per capsule  Commonly known as:  DYAZIDE  Take 1 capsule by mouth every morning.       Allergies  Allergen Reactions  . Codeine Shortness Of Breath and Itching  . Penicillins Rash  . Sulfa Antibiotics Rash  Follow-up Information   Follow up with Samuel McDowell, MD On 02/07/2013. (2pm)    [facility]   Contact information:   [address] [place] [address] [phone]       Follow up with Catalina Pizza, MD. Schedule an appointment as soon as possible for a visit in 1 week.   Specialty:  Internal Medicine   Contact information:    [address]  [place] [address] [phone]        The results of significant diagnostics from this hospitalization (including imaging, microbiology, ancillary and laboratory) are listed below for reference.    Significant Diagnostic Studies: Ct Angio Chest Pe W/cm &/or Wo Cm  02/01/2013   CLINICAL DATA:  Chest pain, nausea, hypertension, shortness of breath. History of ovarian cancer. Evaluate for pulmonary embolism.  EXAM: CT ANGIOGRAPHY CHEST WITH CONTRAST  TECHNIQUE: Multidetector CT imaging of the chest was performed using the standard protocol during bolus administration of intravenous contrast. Multiplanar CT image reconstructions including MIPs were obtained to evaluate the vascular anatomy.  CONTRAST:  OMNIPAQUE IOHEXOL 350 MG/ML SOLN  COMPARISON:  CT of chest March 12, 2011 and chest radiograph January 31, 2013.  FINDINGS: Larger body habitus results in degraded image quality with noisy images.  No pulmonary arterial filling defects to the level of the subsegmental branches. Main pulmonary artery is not enlarged.  The heart is mildly  enlarged, with similar Coronary artery calcifications. Pericardium is unremarkable. Thoracic aorta is normal course and caliber. Mild calcific atherosclerosis at the arch.  Stable lingular scarring with minimal dependent atelectasis. Stable 3 mm right upper lobe pulmonary nodule, coronal 58/85. No new pulmonary nodules. No masses. No focal consolidations. Tracheobronchial tree is patent, midline. No pneumothorax.  Greater than expected number of non pathologically enlarged mediastinal lymph nodes are relatively unchanged. Thoracic esophagus is unremarkable, with exception of small hiatal hernia. Fatty liver, the with included view of the abdomen is otherwise unremarkable. Moderate degenerative changes of thoracic spine. Mild left thyromegaly with substernal extent again noted.  Review of the MIP images confirms the above findings.  IMPRESSION: No pulmonary embolism nor acute pulmonary process.  Stable cardiomegaly.   Electronically Signed   By: Awilda Metro   On: 02/01/2013 02:21   Dg Chest Portable 1 View  01/31/2013   CLINICAL DATA:  Chest pain, shortness of breath, history diabetes, hypertension, ovarian cancer, COPD, MI  EXAM: PORTABLE CHEST - 1 VIEW  COMPARISON:  Portable exam 1621 hr compared to 04/21/2011  FINDINGS: Enlargement of cardiac silhouette.  Atherosclerotic calcification aortic arch.  Mediastinal contours and pulmonary vascularity normal.  Lungs clear.  No pleural effusion or pneumothorax.  Bones unremarkable.  IMPRESSION: Enlargement of cardiac silhouette.  No acute abnormalities.   Electronically Signed   By: Ulyses Southward M.D.   On: 01/31/2013 16:31    Microbiology: No results found for this or any previous visit (from the past 240 hour(s)).   Labs: Basic Metabolic Panel:  Recent Labs Lab 01/31/13 1705 02/01/13 0337  NA 140 137  K 3.9 3.9  CL 100 99  CO2 24 28  GLUCOSE 136* 116*  BUN 27* 21  CREATININE 0.94 0.90  CALCIUM 10.1 9.0   Liver Function Tests:  Recent  Labs Lab 02/01/13 0337  AST 15  ALT 15  ALKPHOS 46  BILITOT 0.3  PROT 6.5  ALBUMIN 3.4*   No results found for this basename: LIPASE, AMYLASE,  in the last 168 hours No results found  for this basename: AMMONIA,  in the last 168 hours CBC:  Recent Labs Lab 01/31/13 1705 02/01/13 0337  WBC 9.4 7.8  NEUTROABS 7.5  --   HGB 13.4 12.3  HCT 40.7 37.2  MCV 83.9 84.0  PLT 250 232   Cardiac Enzymes:  Recent Labs Lab 01/31/13 1705 01/31/13 1904 01/31/13 2222 02/01/13 0337 02/01/13 1023  TROPONINI <0.30 <0.30 <0.30 <0.30 <0.30   BNP: BNP (last 3 results)  Recent Labs  01/31/13 1705  PROBNP 440.0*   CBG:  Recent Labs Lab 02/01/13 1136 02/01/13 1647 02/01/13 2101 02/02/13 0711 02/02/13 1124  GLUCAP 123* 98 120* 115* 179*       Signed:  BLACK,KAREN M  [facility] 02/02/2013, 12:37 PM

## 2013-02-02 NOTE — Progress Notes (Signed)
Consulting cardiologist: Vignesh Willert  Subjective:    No complaints of recurrent discomfort or dizziness.  Objective:   Temp:  [97.6 F (36.4 C)-97.9 F (36.6 C)] 97.6 F (36.4 C) (01/08 0350) Pulse Rate:  [62-66] 62 (01/08 0350) Resp:  [20] 20 (01/08 0350) BP: (118-149)/(67-78) 118/67 mmHg (01/08 0350) SpO2:  [95 %-98 %] 98 % (01/08 0708) Last BM Date: 02/01/13  Filed Weights   01/31/13 1611 01/31/13 2143  Weight: 283 lb (128.368 kg) 283 lb (128.368 kg)    Intake/Output Summary (Last 24 hours) at 02/02/13 0920 Last data filed at 02/02/13 0350  Gross per 24 hour  Intake    480 ml  Output   1150 ml  Net   -670 ml    Telemetry: NSR  Exam:  General: No acute distress.Obese  HEENT: Conjunctiva and lids normal, oropharynx clear.  Lungs: Clear to auscultation, nonlabored.  Cardiac: No elevated JVP or bruits. RRR, no gallop or rub.   Abdomen: Normoactive bowel sounds, nontender, nondistended.  Extremities: No pitting edema, distal pulses full.  Neuropsychiatric: Alert and oriented x3, affect appropriate.   Lab Results:  Basic Metabolic Panel:  Recent Labs Lab 01/31/13 1705 02/01/13 0337  NA 140 137  K 3.9 3.9  CL 100 99  CO2 24 28  GLUCOSE 136* 116*  BUN 27* 21  CREATININE 0.94 0.90  CALCIUM 10.1 9.0    Liver Function Tests:  Recent Labs Lab 02/01/13 0337  AST 15  ALT 15  ALKPHOS 46  BILITOT 0.3  PROT 6.5  ALBUMIN 3.4*    CBC:  Recent Labs Lab 01/31/13 1705 02/01/13 0337  WBC 9.4 7.8  HGB 13.4 12.3  HCT 40.7 37.2  MCV 83.9 84.0  PLT 250 232    Cardiac Enzymes:  Recent Labs Lab 01/31/13 2222 02/01/13 0337 02/01/13 1023  TROPONINI <0.30 <0.30 <0.30    BNP:  Recent Labs  01/31/13 1705  PROBNP 440.0*    Coagulation:  Recent Labs Lab 02/01/13 0337  INR 1.08    Radiology: Ct Angio Chest Pe W/cm &/or Wo Cm  02/01/2013   CLINICAL DATA:  Chest pain, nausea, hypertension, shortness of breath. History of ovarian  cancer. Evaluate for pulmonary embolism.  EXAM: CT ANGIOGRAPHY CHEST WITH CONTRAST  TECHNIQUE:   IMPRESSION: No pulmonary embolism nor acute pulmonary process.  Stable cardiomegaly.   Electronically Signed   By: Elon Alas   On: 02/01/2013 02:21   Dg Chest Portable 1 View  01/31/2013   CLINICAL DATA:  Chest pain, shortness of breath, history diabetes, hypertension, ovarian cancer, COPD, MI  EXAM: PORTABLE CHEST - 1 VIEW  COMPARISON:  Portable exam 1621 hr compared to 04/21/2011  FINDINGS: Enlargement of cardiac silhouette.  Atherosclerotic calcification aortic arch.  Mediastinal contours and pulmonary vascularity normal.  Lungs clear.  No pleural effusion or pneumothorax.  Bones unremarkable.  IMPRESSION: Enlargement of cardiac silhouette.  No acute abnormalities.   Electronically Signed   By: Lavonia Dana M.D.   On: 01/31/2013 16:31     Medications:   Scheduled Medications: . amLODipine  10 mg Oral Daily  . antiseptic oral rinse  15 mL Mouth Rinse BID  . aspirin EC  81 mg Oral Daily  . atorvastatin  40 mg Oral q1800  . carvedilol  3.125 mg Oral BID WC  . docusate sodium  100 mg Oral BID  . enoxaparin (LOVENOX) injection  60 mg Subcutaneous QHS  . HYDROcodone-acetaminophen  1 tablet Oral BID  . insulin  aspart  0-20 Units Subcutaneous TID WC  . insulin aspart  0-5 Units Subcutaneous QHS  . irbesartan  300 mg Oral Daily  . levothyroxine  150 mcg Oral QAC breakfast  . meclizine  25 mg Oral BID  . mometasone-formoterol  2 puff Inhalation BID  . pantoprazole  80 mg Oral BID AC  . prasugrel  10 mg Oral q morning - 10a  . regadenoson  0.4 mg Intravenous Once  . sodium chloride  3 mL Intravenous Q12H  . sodium chloride  3 mL Intravenous Q12H  . triamterene-hydrochlorothiazide  1 tablet Oral QAC breakfast       PRN Medications: sodium chloride, acetaminophen, acetaminophen, ALPRAZolam, gi cocktail, morphine injection, ondansetron (ZOFRAN) IV, ondansetron, sodium chloride, technetium  sestamibi   Assessment and Plan:   1. Chest Pain: Known history of CAD, with PCI to the LAD in February of 2013, with burning sensation in the left chest radiating to the back with associated diaphoresis. This is not similar to the pain she experienced prior to PCI which is described as left-sided jaw pain and lightheadedness with palpitations. Cardiac markers are negative x3 arguing against ACS.     Planned Lexiscan stress test this am in the setting of multivessel CAD, with LM disease. Evaluated prior to test. She had some heartburn during Lexiscan infusion.Subtle but non-pathologic horizontal ST depression in the inferior leads.   2.Diabetes:  Better controlled during hospitalization. Continued management with dietary restrictions.   Phill Myron. Purcell Nails NP [facility] 02/02/2013, 9:20 AM Patient seen and discussed with NP Purcell Nails. No chest pain overnight, EKG and cardiac enzymes without any evidence of acute coronary syndrome. Symptoms somewhat mixed for cardiac etiology. Completed lexiscan MPI to risk stratify her given her history of CAD with high risk anatomy and prior high risk PCI to distal left main and proximal LAD, she had significant LCX disease that technically could not be opened up and also moderate RCA disease. Lexiscan shows fixed apical and anteroseptal scar. Show moderate sized partially reversible lateral defect consistent with LCX disease that was not able to be opened up, normal LVEF. Overall intermediate risk study. At this point, will work on maximizing medical therapy. Its unclear if her symptoms truly were cardiac, and there has only been one isolated episode. Reattempting intevention on this area may not be feasible and likely high risk. She is on 2 antianginals, will titrate up her coreg to 6.25 mg bid. Increase crestor to 40mg  daily for goal LDL <70, in the hospital it was 98. Follow up with Dr Domenic Polite in 3 weeks. She is committed to lifetime DAPT per interventional  cards due to high risk PCI of distal left main and proximal LAD. Patient ok to be discharged today from cardiac standpoint.   Carlyle Dolly MD

## 2013-02-02 NOTE — Discharge Instructions (Signed)
Take medications as directed

## 2013-02-02 NOTE — Telephone Encounter (Signed)
Received fax refill request  Rx # [id number] Medication:  Pantoprazole SOD DR 40 Mg Tab Qty 120 Sig:  Take 2 tablets (80 Mg total) by mouth 2 times daily before a meal Physician:  Domenic Polite

## 2013-02-02 NOTE — Telephone Encounter (Signed)
rx filled as requested

## 2013-02-07 ENCOUNTER — Ambulatory Visit (INDEPENDENT_AMBULATORY_CARE_PROVIDER_SITE_OTHER): Payer: 59 | Admitting: Cardiology

## 2013-02-07 ENCOUNTER — Encounter: Payer: Self-pay | Admitting: Cardiology

## 2013-02-07 VITALS — BP 120/68 | HR 68 | Ht 61.0 in | Wt 282.0 lb

## 2013-02-07 DIAGNOSIS — E782 Mixed hyperlipidemia: Secondary | ICD-10-CM

## 2013-02-07 DIAGNOSIS — I2510 Atherosclerotic heart disease of native coronary artery without angina pectoris: Secondary | ICD-10-CM

## 2013-02-07 DIAGNOSIS — I10 Essential (primary) hypertension: Secondary | ICD-10-CM

## 2013-02-07 NOTE — Patient Instructions (Signed)
Your physician recommends that you schedule a follow-up appointment in: 3 months. Your physician recommends that you continue on your current medications as directed. Please refer to the Current Medication list given to you today. 

## 2013-02-07 NOTE — Assessment & Plan Note (Signed)
Crestor dose has been advanced in the interim. Recent LDL 98.

## 2013-02-07 NOTE — Progress Notes (Signed)
Clinical Summary Ms. Mccahill is a medically complex 63 y.o.female recently admitted to [facility] with chest pain, ruled out for myocardial infarction at that time. She was seen by Dr. Harl Bowie in consultation and did undergo followup ischemic testing. Lexiscan Cardiolite showed evidence of scar in the apical and anteroseptal distribution without ischemia. There was a moderate-sized ischemic defect in the lateral wall consistent with the patient's circumflex disease that had not been able to be successfully intervened upon as of 2013 - actually described as being occluded at the end of her left main intervention in February 2013. LVEF 73%. It was felt that medical therapy was the most appropriate option.  She states that she feels better, no recurring chest pain symptoms. We reviewed her medications. Coreg and Crestor doses have advanced. Blood pressure and heart rate are very well controlled today. We discussed the strategy of continued medical therapy and observation.  Lab work done just recently showed cholesterol 176, triglycerides 126, HDL 53, LDL 98.   Allergies  Allergen Reactions  . Codeine Shortness Of Breath and Itching  . Lipitor [Atorvastatin]   . Penicillins Rash  . Sulfa Antibiotics Rash    Current Outpatient Prescriptions  Medication Sig Dispense Refill  . ACCU-CHEK FASTCLIX LANCETS MISC       . ACCU-CHEK SMARTVIEW test strip       . albuterol (PROVENTIL) (2.5 MG/3ML) 0.083% nebulizer solution Take 2.5 mg by nebulization every 6 (six) hours as needed. For wheezing. Combines with atrovent      . ALPRAZolam (XANAX) 1 MG tablet Take 1 mg by mouth at bedtime as needed for anxiety or sleep.       [name] amLODipine (NORVASC) 10 MG tablet Take 10 mg by mouth daily.      [name] aspirin EC 81 MG tablet Take 1 tablet (81 mg total) by mouth daily.      . calcium-vitamin D (OSCAL WITH D) 500-200 MG-UNIT per tablet Take 1 tablet by mouth daily.       . carvedilol (COREG) 6.25 MG tablet Take 1  tablet (6.25 mg total) by mouth 2 (two) times daily with a meal.  30 tablet  0  . Ferrous Sulfate (IRON) 325 (65 FE) MG TABS Take 1 tablet by mouth daily after breakfast.       . Fluticasone-Salmeterol (ADVAIR) 250-50 MCG/DOSE AEPB Inhale 1 puff into the lungs 2 (two) times daily.  60 each  0  . glipiZIDE-metformin (METAGLIP) 2.5-500 MG per tablet Take 1 tablet by mouth 2 (two) times daily before a meal. Please hold your Metformin for 48hrs after procedure. Resume on 03/21/11      . HYDROcodone-acetaminophen (NORCO) 5-325 MG per tablet Take 1 tablet by mouth 2 (two) times daily. For pain      . ipratropium (ATROVENT) 0.02 % nebulizer solution Take 500 mcg by nebulization every 6 (six) hours as needed. For shortness of breath. Combines it with Albuterol      . levothyroxine (SYNTHROID, LEVOTHROID) 150 MCG tablet Take 1 tablet (150 mcg total) by mouth every morning.  30 tablet  1  . loratadine (CLARITIN) 10 MG tablet Take 10 mg by mouth every morning.       . meclizine (ANTIVERT) 25 MG tablet Take 25 mg by mouth 2 (two) times daily.       . nitroGLYCERIN (NITROSTAT) 0.4 MG SL tablet Place 1 tablet (0.4 mg total) under the tongue every 5 (five) minutes as needed for chest pain (up to 3  doses).  25 tablet  3  . olmesartan (BENICAR) 40 MG tablet Take 40 mg by mouth every morning.       . pantoprazole (PROTONIX) 40 MG tablet Take 2 tablets (80 mg total) by mouth 2 (two) times daily before a meal.  120 tablet  3  . prasugrel (EFFIENT) 10 MG TABS tablet Take 10 mg by mouth every morning.      . Prenatal Vit-Fe Fumarate-FA (PRENATAL MULTIVITAMIN) TABS Take 1 tablet by mouth daily.        . rosuvastatin (CRESTOR) 20 MG tablet Take 2 tablets (40 mg total) by mouth daily.  30 tablet  0  . saxagliptin HCl (ONGLYZA) 5 MG TABS tablet Take 5 mg by mouth every morning.       . triamterene-hydrochlorothiazide (DYAZIDE) 37.5-25 MG per capsule Take 1 capsule by mouth every morning.        . [DISCONTINUED] esomeprazole  (NEXIUM) 40 MG capsule Take 40 mg by mouth daily before breakfast.        No current facility-administered medications for this visit.    Past Medical History  Diagnosis Date  . Type 2 diabetes mellitus   . Essential hypertension, benign   . Pulmonary embolism     Recurrent, previously on Coumadin  . Hiatal hernia   . Ovarian cancer   . COPD (chronic obstructive pulmonary disease)   . NSTEMI (non-ST elevated myocardial infarction)   . Coronary atherosclerosis of native coronary artery     Multivessel, DES distal left main, unsuccessful PCI circumflex 2/13, LVEF 50-55%  . Hypothyroidism   . Mixed hyperlipidemia   . Overweight   . Ejection fraction     EF 50-55%, echo, March 15, 2011, question of mild posterior hypokinesis, technically difficult study despite the use of contrast    Social History Ms. Lasch reports that she quit smoking about 33 years ago. Her smoking use included Cigarettes. She smoked 0.00 packs per day. She has never used smokeless tobacco. Ms. Aldaba reports that she does not drink alcohol.  Review of Systems No palpitations or syncope. Mild rhinorrhea. No fevers or chills. Stable appetite. Otherwise negative.  Physical Examination Filed Vitals:   02/07/13 1341  BP: 120/68  Pulse: 68   Filed Weights   02/07/13 1341  Weight: 282 lb (127.914 kg)    Morbidly obese, comfortable at rest.  HEENT: Conjunctiva and lids normal, oropharynx clear.  Neck: Supple ,increased girth, no carotid bruits, no thyromegaly.  Lungs: Clear to auscultation, nonlabored breathing at rest.  Cardiac: Regular rate and rhythm, no S3, soft systolic murmur, no pericardial rub.  Abdomen: Protuberent, nontender, bowel sounds present, no guarding or rebound.  Extremities: No pitting edema, distal pulses 2+.    Problem List and Plan   Coronary atherosclerosis of native coronary artery Plan to continue medical therapy and observation based on recent cardiac testing.  Patient is clinically stable. Followup arranged.  Essential hypertension, benign Blood pressure is well-controlled.  Mixed hyperlipidemia Crestor dose has been advanced in the interim. Recent LDL 98.    Satira Sark, M.D., F.A.C.C.

## 2013-02-07 NOTE — Assessment & Plan Note (Signed)
Blood pressure is well controlled 

## 2013-02-07 NOTE — Assessment & Plan Note (Signed)
Plan to continue medical therapy and observation based on recent cardiac testing. Patient is clinically stable. Followup arranged.

## 2013-02-16 ENCOUNTER — Other Ambulatory Visit (HOSPITAL_COMMUNITY): Payer: Self-pay | Admitting: Internal Medicine

## 2013-02-20 ENCOUNTER — Telehealth: Payer: Self-pay | Admitting: Cardiology

## 2013-02-20 MED ORDER — ROSUVASTATIN CALCIUM 20 MG PO TABS: 40.0000 mg | ORAL_TABLET | Freq: Every day | ORAL | Status: DC

## 2013-02-20 MED ORDER — CARVEDILOL 6.25 MG PO TABS: 6.2500 mg | ORAL_TABLET | Freq: Two times a day (BID) | ORAL | Status: DC

## 2013-02-20 NOTE — Telephone Encounter (Signed)
Medication sent via escribe.  

## 2013-02-20 NOTE — Telephone Encounter (Signed)
Needs refills on Crestor and Coreg sent to [facility] in [place] / tgs

## 2013-02-21 ENCOUNTER — Telehealth: Payer: Self-pay | Admitting: Cardiology

## 2013-02-21 MED ORDER — ROSUVASTATIN CALCIUM 20 MG PO TABS: 40.0000 mg | ORAL_TABLET | Freq: Every day | ORAL | Status: DC

## 2013-02-21 MED ORDER — CARVEDILOL 6.25 MG PO TABS: 6.2500 mg | ORAL_TABLET | Freq: Two times a day (BID) | ORAL | Status: DC

## 2013-02-21 NOTE — Telephone Encounter (Signed)
Patient was sent in refills on Crestor and Coreg for 30 day supply.  Patient takes both of these twice a day.  Please fix these for 60 day supply / tgs

## 2013-02-21 NOTE — Telephone Encounter (Signed)
Sent in the correct RX via escribe for pt

## 2013-05-11 ENCOUNTER — Encounter: Payer: Self-pay | Admitting: Cardiology

## 2013-05-11 ENCOUNTER — Ambulatory Visit (INDEPENDENT_AMBULATORY_CARE_PROVIDER_SITE_OTHER): Payer: 59 | Admitting: Cardiology

## 2013-05-11 VITALS — BP 132/67 | HR 66 | Ht 61.0 in | Wt 279.0 lb

## 2013-05-11 DIAGNOSIS — E782 Mixed hyperlipidemia: Secondary | ICD-10-CM

## 2013-05-11 DIAGNOSIS — I10 Essential (primary) hypertension: Secondary | ICD-10-CM

## 2013-05-11 DIAGNOSIS — I2510 Atherosclerotic heart disease of native coronary artery without angina pectoris: Secondary | ICD-10-CM

## 2013-05-11 NOTE — Assessment & Plan Note (Signed)
Continue current medical regimen.

## 2013-05-11 NOTE — Progress Notes (Signed)
Clinical Summary [name] is a medically complex 63 y.o.female last seen in January of this year. Fortunately, she has been stable from a cardiac perspective, no recurrent chest pain or hospitalizations.  Lexiscan Cardiolite in January of this year showed evidence of scar in the apical and anteroseptal distribution without ischemia. There was a moderate-sized ischemic defect in the lateral wall consistent with the patient's circumflex disease that had not been able to be successfully intervened upon as of 2013 - actually described as being occluded at the end of her left main intervention in February 2013. LVEF 73%. It was felt that medical therapy was the most appropriate option.  She reports compliance with her medications.   Allergies  Allergen Reactions  . Codeine Shortness Of Breath and Itching  . Lipitor [Atorvastatin]   . Penicillins Rash  . Sulfa Antibiotics Rash    Current Outpatient Prescriptions  Medication Sig Dispense Refill  . ACCU-CHEK FASTCLIX LANCETS MISC       . ACCU-CHEK SMARTVIEW test strip       . albuterol (PROVENTIL) (2.5 MG/3ML) 0.083% nebulizer solution Take 2.5 mg by nebulization every 6 (six) hours as needed. For wheezing. Combines with atrovent      . ALPRAZolam (XANAX) 1 MG tablet Take 1 mg by mouth at bedtime as needed for anxiety or sleep.       [name] amLODipine (NORVASC) 10 MG tablet Take 10 mg by mouth daily.      [name] aspirin EC 81 MG tablet Take 1 tablet (81 mg total) by mouth daily.      . calcium-vitamin D (OSCAL WITH D) 500-200 MG-UNIT per tablet Take 1 tablet by mouth daily.       . carvedilol (COREG) 6.25 MG tablet Take 1 tablet (6.25 mg total) by mouth 2 (two) times daily with a meal.  60 tablet  6  . Ferrous Sulfate (IRON) 325 (65 FE) MG TABS Take 1 tablet by mouth daily after breakfast.       . Fluticasone-Salmeterol (ADVAIR) 250-50 MCG/DOSE AEPB Inhale 1 puff into the lungs 2 (two) times daily.  60 each  0  . glipiZIDE-metformin (METAGLIP)  2.5-500 MG per tablet Take 1 tablet by mouth 2 (two) times daily before a meal. Please hold your Metformin for 48hrs after procedure. Resume on 03/21/11      . HYDROcodone-acetaminophen (NORCO) 5-325 MG per tablet Take 1 tablet by mouth 2 (two) times daily. For pain      . ipratropium (ATROVENT) 0.02 % nebulizer solution Take 500 mcg by nebulization every 6 (six) hours as needed. For shortness of breath. Combines it with Albuterol      . levothyroxine (SYNTHROID, LEVOTHROID) 150 MCG tablet Take 1 tablet (150 mcg total) by mouth every morning.  30 tablet  1  . loratadine (CLARITIN) 10 MG tablet Take 10 mg by mouth every morning.       . meclizine (ANTIVERT) 25 MG tablet Take 25 mg by mouth 2 (two) times daily.       [name] olmesartan (BENICAR) 40 MG tablet Take 40 mg by mouth every morning.       . pantoprazole (PROTONIX) 40 MG tablet Take 2 tablets (80 mg total) by mouth 2 (two) times daily before a meal.  120 tablet  3  . prasugrel (EFFIENT) 10 MG TABS tablet Take 10 mg by mouth every morning.      . Prenatal Vit-Fe Fumarate-FA (PRENATAL MULTIVITAMIN) TABS Take 1 tablet by mouth daily.        [name]  rosuvastatin (CRESTOR) 20 MG tablet Take 2 tablets (40 mg total) by mouth daily.  60 tablet  6  . saxagliptin HCl (ONGLYZA) 5 MG TABS tablet Take 5 mg by mouth every morning.       . triamterene-hydrochlorothiazide (DYAZIDE) 37.5-25 MG per capsule Take 1 capsule by mouth every morning.        . nitroGLYCERIN (NITROSTAT) 0.4 MG SL tablet Place 1 tablet (0.4 mg total) under the tongue every 5 (five) minutes as needed for chest pain (up to 3 doses).  25 tablet  3  . [DISCONTINUED] esomeprazole (NEXIUM) 40 MG capsule Take 40 mg by mouth daily before breakfast.        No current facility-administered medications for this visit.    Past Medical History  Diagnosis Date  . Type 2 diabetes mellitus   . Essential hypertension, benign   . Pulmonary embolism     Recurrent, previously on Coumadin  . Hiatal hernia   .  Ovarian cancer   . COPD (chronic obstructive pulmonary disease)   . NSTEMI (non-ST elevated myocardial infarction)   . Coronary atherosclerosis of native coronary artery     Multivessel, DES distal left main, unsuccessful PCI circumflex 2/13, LVEF 50-55%  . Hypothyroidism   . Mixed hyperlipidemia   . Overweight   . Ejection fraction     EF 50-55%, echo, March 15, 2011, question of mild posterior hypokinesis, technically difficult study despite the use of contrast    Social History [name] reports that she quit smoking about 33 years ago. Her smoking use included Cigarettes. She smoked 0.00 packs per day. She has never used smokeless tobacco. [name] reports that she does not drink alcohol.  Review of Systems No palpitations or syncope. No orthopnea or PND. No bleeding gums. Otherwise negative.  Physical Examination Filed Vitals:   05/11/13 1405  BP: 132/67  Pulse: 66   Filed Weights   05/11/13 1405  Weight: 279 lb (126.554 kg)    Morbidly obese, comfortable at rest.  HEENT: Conjunctiva and lids normal, oropharynx clear.  Neck: Supple ,increased girth, no carotid bruits, no thyromegaly.  Lungs: Clear to auscultation, nonlabored breathing at rest.  Cardiac: Regular rate and rhythm, no S3, soft systolic murmur, no pericardial rub.  Abdomen: Protuberent, nontender, bowel sounds present, no guarding or rebound.  Extremities: No pitting edema, distal pulses 2+.    Problem List and Plan   Coronary atherosclerosis of native coronary artery Based on most recent testing and clinical stability, plan to continue medical therapy. Followup arranged in 4 months.  Mixed hyperlipidemia She continues on Crestor. Needs followup with [name] for repeat lab work.  Essential hypertension, benign Continue current medical regimen.    Satira Sark, M.D., F.A.C.C.

## 2013-05-11 NOTE — Patient Instructions (Signed)
Your physician wants you to follow-up in: 4 months You will receive a reminder letter in the mail two months in advance. If you don't receive a letter, please call our office to schedule the follow-up appointment.   Your physician recommends that you continue on your current medications as directed. Please refer to the Current Medication list given to you today.      Thank you for choosing [facility] !         

## 2013-05-11 NOTE — Assessment & Plan Note (Signed)
She continues on Crestor. Needs followup with Dr. [name] for repeat lab work.

## 2013-05-11 NOTE — Assessment & Plan Note (Signed)
Based on most recent testing and clinical stability, plan to continue medical therapy. Followup arranged in 4 months.

## 2013-05-27 ENCOUNTER — Other Ambulatory Visit: Payer: Self-pay | Admitting: Cardiology

## 2013-06-01 ENCOUNTER — Other Ambulatory Visit: Payer: Self-pay | Admitting: Cardiology

## 2013-06-02 ENCOUNTER — Other Ambulatory Visit: Payer: Self-pay | Admitting: Cardiology

## 2013-06-20 ENCOUNTER — Encounter: Payer: Self-pay | Admitting: Cardiology

## 2013-06-27 ENCOUNTER — Other Ambulatory Visit (HOSPITAL_COMMUNITY): Payer: Self-pay | Admitting: Cardiology

## 2013-06-29 ENCOUNTER — Other Ambulatory Visit (HOSPITAL_COMMUNITY): Payer: Self-pay | Admitting: Cardiology

## 2013-07-24 ENCOUNTER — Other Ambulatory Visit (HOSPITAL_COMMUNITY): Payer: Self-pay | Admitting: Cardiology

## 2013-07-28 ENCOUNTER — Encounter (HOSPITAL_COMMUNITY): Payer: Self-pay | Admitting: Emergency Medicine

## 2013-07-28 ENCOUNTER — Emergency Department (HOSPITAL_COMMUNITY)
Admission: EM | Admit: 2013-07-28 | Discharge: 2013-07-29 | Disposition: A | Discharge status: Home / Self Care | Payer: 59 | Attending: Emergency Medicine | Admitting: Emergency Medicine

## 2013-07-28 DIAGNOSIS — Z86711 Personal history of pulmonary embolism: Secondary | ICD-10-CM | POA: Insufficient documentation

## 2013-07-28 DIAGNOSIS — Z7982 Long term (current) use of aspirin: Secondary | ICD-10-CM | POA: Insufficient documentation

## 2013-07-28 DIAGNOSIS — Z880 Allergy status to penicillin: Secondary | ICD-10-CM | POA: Insufficient documentation

## 2013-07-28 DIAGNOSIS — Z8639 Personal history of other endocrine, nutritional and metabolic disease: Secondary | ICD-10-CM | POA: Insufficient documentation

## 2013-07-28 DIAGNOSIS — E782 Mixed hyperlipidemia: Secondary | ICD-10-CM | POA: Insufficient documentation

## 2013-07-28 DIAGNOSIS — Z862 Personal history of diseases of the blood and blood-forming organs and certain disorders involving the immune mechanism: Secondary | ICD-10-CM | POA: Insufficient documentation

## 2013-07-28 DIAGNOSIS — I252 Old myocardial infarction: Secondary | ICD-10-CM | POA: Insufficient documentation

## 2013-07-28 DIAGNOSIS — Z79899 Other long term (current) drug therapy: Secondary | ICD-10-CM | POA: Insufficient documentation

## 2013-07-28 DIAGNOSIS — J4489 Other specified chronic obstructive pulmonary disease: Secondary | ICD-10-CM | POA: Insufficient documentation

## 2013-07-28 DIAGNOSIS — E663 Overweight: Secondary | ICD-10-CM | POA: Insufficient documentation

## 2013-07-28 DIAGNOSIS — Z8543 Personal history of malignant neoplasm of ovary: Secondary | ICD-10-CM | POA: Insufficient documentation

## 2013-07-28 DIAGNOSIS — Z8679 Personal history of other diseases of the circulatory system: Secondary | ICD-10-CM | POA: Insufficient documentation

## 2013-07-28 DIAGNOSIS — R040 Epistaxis: Secondary | ICD-10-CM

## 2013-07-28 DIAGNOSIS — E119 Type 2 diabetes mellitus without complications: Secondary | ICD-10-CM | POA: Insufficient documentation

## 2013-07-28 DIAGNOSIS — J449 Chronic obstructive pulmonary disease, unspecified: Secondary | ICD-10-CM | POA: Insufficient documentation

## 2013-07-28 DIAGNOSIS — Z8719 Personal history of other diseases of the digestive system: Secondary | ICD-10-CM | POA: Insufficient documentation

## 2013-07-28 DIAGNOSIS — I10 Essential (primary) hypertension: Secondary | ICD-10-CM | POA: Insufficient documentation

## 2013-07-28 DIAGNOSIS — Z87891 Personal history of nicotine dependence: Secondary | ICD-10-CM | POA: Insufficient documentation

## 2013-07-28 MED ORDER — COCAINE HCL 4 % EX SOLN
CUTANEOUS | Status: AC
  Administered 2013-07-28: 4 mL via NASAL
  Filled 2013-07-28: qty 4

## 2013-07-28 MED ORDER — COCAINE HCL 4 % EX SOLN
4.0000 mL | Freq: Once | CUTANEOUS | Status: AC
Start: 2013-07-28 — End: 2013-07-28
  Administered 2013-07-28: 4 mL via NASAL

## 2013-07-28 NOTE — ED Provider Notes (Signed)
CSN: [id number]     Arrival date & time 07/28/13  2059 History   First MD Initiated Contact with Patient 07/28/13 2110    This chart was scribed for [name], MD by Terressa Koyanagi, ED Scribe. This patient was seen in room APA05/APA05 and the patient's care was started at 9:21 PM.  PCP: Delphina Cahill, MD  Chief Complaint  Patient presents with  . Epistaxis   The history is provided by the patient. No language interpreter was used.   HPI Comments:  [name] is a 63 y.o. female, brought in by ambulance, with an extensive medical Hx noted below and significant for DM TII, CA (ovarian), mixed HLD, HTN, COPD, who presents to the Emergency Department complaining of intermittent right sided nose bleed onset today around 3:30PM. Upon arrival to the ED, pt's BP was 212/102. At 2101 pt's BP lowered to 156/75 and then at 2115 further lowered to 147/68.   Pt reports that she has an extensive medication list (noted below), including Fluticasone-Salmeterol (ADVAIR) 250-50 MCG, 2 times daily. Pt denies difficulty breathing, SOB, chest pain.  Medications include both Effient and aspirin.   Pt is a former smoker with a quit date of 01/27/80.   Past Medical History  Diagnosis Date  . Type 2 diabetes mellitus   . Essential hypertension, benign   . Pulmonary embolism     Recurrent, previously on Coumadin  . Hiatal hernia   . Ovarian cancer   . COPD (chronic obstructive pulmonary disease)   . NSTEMI (non-ST elevated myocardial infarction)   . Coronary atherosclerosis of native coronary artery     Multivessel, DES distal left main, unsuccessful PCI circumflex 2/13, LVEF 50-55%  . Hypothyroidism   . Mixed hyperlipidemia   . Overweight(278.02)   . Ejection fraction     EF 50-55%, echo, March 15, 2011, question of mild posterior hypokinesis, technically difficult study despite the use of contrast   Past Surgical History  Procedure Laterality Date  . Abdominal surgery    .  Cholecystectomy    . Appendectomy    . Cesarean section    . Tonsillectomy    . Breast lumpectomy    . Small intestine surgery  12/2007    Dr [name]  . Closure of abdominal wound/wound vac    . Cataract extraction w/phaco  01/29/2011    Procedure: CATARACT EXTRACTION PHACO AND INTRAOCULAR LENS PLACEMENT (IOC);  Surgeon: [name];  Location: [facility];  Service: Ophthalmology;  Laterality: Left;  CDE: 1.81   Family History  Problem Relation Age of Onset  . Coronary artery disease Father     MI at age 38  . Heart attack Father   . Coronary artery disease Mother     CABG at age 63  . Heart attack Mother   . Cirrhosis Sister   . Fibromyalgia Sister   . Osteoporosis Sister    History  Substance Use Topics  . Smoking status: Former Smoker    Types: Cigarettes    Quit date: 01/27/1980  . Smokeless tobacco: Never Used  . Alcohol Use: No   OB History   Grav Para Term Preterm Abortions TAB SAB Ect Mult Living                 Review of Systems  Constitutional:       Per HPI, otherwise negative  HENT: Positive for nosebleeds (right nostril).        Per HPI, otherwise negative  Respiratory: Negative.  Negative for shortness of breath.        Per HPI, otherwise negative  Cardiovascular: Negative for chest pain.       Per HPI, otherwise negative  Gastrointestinal: Negative for vomiting.  Endocrine:       Negative aside from HPI  Genitourinary:       Neg aside from HPI   Musculoskeletal:       Per HPI, otherwise negative  Skin: Negative.   Neurological: Negative for syncope.      Allergies  Codeine; Lipitor; Penicillins; and Sulfa antibiotics  Home Medications   Prior to Admission medications   Medication Sig Start Date End Date Taking? Authorizing Provider  ACCU-CHEK FASTCLIX LANCETS [place]  01/06/13   Historical Provider, MD  ACCU-CHEK SMARTVIEW test strip  01/20/13   Historical Provider, MD  albuterol (PROVENTIL) (2.5 MG/3ML) 0.083% nebulizer solution Take 2.5 mg by  nebulization every 6 (six) hours as needed. For wheezing. Combines with atrovent    Historical Provider, MD  ALPRAZolam Duanne Moron) 1 MG tablet Take 1 mg by mouth at bedtime as needed for anxiety or sleep.     Historical Provider, MD  amLODipine (NORVASC) 10 MG tablet Take 10 mg by mouth daily. 08/11/10   Nimish Luther Parody, MD  aspirin EC 81 MG tablet Take 1 tablet (81 mg total) by mouth daily. 02/02/13   Radene Gunning, NP  calcium-vitamin D (OSCAL WITH D) 500-200 MG-UNIT per tablet Take 1 tablet by mouth daily.     Historical Provider, MD  carvedilol (COREG) 6.25 MG tablet Take 1 tablet (6.25 mg total) by mouth 2 (two) times daily with a meal. 02/21/13   Satira Sark, MD  EFFIENT 10 MG TABS tablet TAKE 1 TABLET (10 MG TOTAL) BY MOUTH DAILY. 06/27/13   Satira Sark, MD  EFFIENT 10 MG TABS tablet TAKE 1 TABLET (10 MG TOTAL) BY MOUTH DAILY.    Satira Sark, MD  Ferrous Sulfate (IRON) 325 (65 FE) MG TABS Take 1 tablet by mouth daily after breakfast.     Historical Provider, MD  Fluticasone-Salmeterol (ADVAIR) 250-50 MCG/DOSE AEPB Inhale 1 puff into the lungs 2 (two) times daily. 08/11/10   Nimish Luther Parody, MD  glipiZIDE-metformin (METAGLIP) 2.5-500 MG per tablet Take 1 tablet by mouth 2 (two) times daily before a meal. Please hold your Metformin for 48hrs after procedure. Resume on 03/21/11 03/20/11   Jessica A Hope, PA-C  HYDROcodone-acetaminophen (NORCO) 5-325 MG per tablet Take 1 tablet by mouth 2 (two) times daily. For pain    Historical Provider, MD  ipratropium (ATROVENT) 0.02 % nebulizer solution Take 500 mcg by nebulization every 6 (six) hours as needed. For shortness of breath. Combines it with Albuterol 08/11/10   Nimish Luther Parody, MD  levothyroxine (SYNTHROID, LEVOTHROID) 150 MCG tablet Take 1 tablet (150 mcg total) by mouth every morning. 03/20/11   Jessica A Hope, PA-C  loratadine (CLARITIN) 10 MG tablet Take 10 mg by mouth every morning.     Historical Provider, MD  meclizine (ANTIVERT) 25  MG tablet Take 25 mg by mouth 2 (two) times daily.     Historical Provider, MD  nitroGLYCERIN (NITROSTAT) 0.4 MG SL tablet Place 1 tablet (0.4 mg total) under the tongue every 5 (five) minutes as needed for chest pain (up to 3 doses). 03/20/11 01/31/13  Jessica A Hope, PA-C  olmesartan (BENICAR) 40 MG tablet Take 40 mg by mouth every morning.     Historical Provider, MD  pantoprazole ([place])  40 MG tablet TAKE 2 TABLETS BY MOUTH TWICE A DAY BEFORE MEALS 06/02/13   Satira Sark, MD  prasugrel (EFFIENT) 10 MG TABS tablet Take 10 mg by mouth every morning.    Historical Provider, MD  Prenatal Vit-Fe Fumarate-FA (PRENATAL MULTIVITAMIN) TABS Take 1 tablet by mouth daily.      Historical Provider, MD  rosuvastatin (CRESTOR) 20 MG tablet Take 2 tablets (40 mg total) by mouth daily. 02/21/13   Satira Sark, MD  saxagliptin HCl (ONGLYZA) 5 MG TABS tablet Take 5 mg by mouth every morning.     Historical Provider, MD  triamterene-hydrochlorothiazide (DYAZIDE) 37.5-25 MG per capsule Take 1 capsule by mouth every morning.      Historical Provider, MD   Triage Vitals: BP 147/68  Pulse 98  Temp(Src) 98.2 F (36.8 C) (Oral)  Resp 20  Ht 5\' 1"  (1.549 m)  Wt 279 lb (126.554 kg)  BMI 52.74 kg/m2  SpO2 95% Physical Exam  Nursing note and vitals reviewed. Constitutional: She is oriented to person, place, and time. She appears well-developed and well-nourished. No distress.  HENT:  Head: Normocephalic and atraumatic.  Active bleeding from right nostril   Eyes: Conjunctivae and EOM are normal.  Cardiovascular: Normal rate and regular rhythm.   Pulmonary/Chest: Effort normal and breath sounds normal. No stridor. No respiratory distress.  Abdominal: She exhibits no distension.  Musculoskeletal: She exhibits no edema.  Neurological: She is alert and oriented to person, place, and time. No cranial nerve deficit.  Skin: Skin is warm and dry.  Psychiatric: She has a normal mood and affect.    ED Course   EPISTAXIS MANAGEMENT Date/Time: 07/28/2013 10:15 PM Performed by: [name] Authorized by: [name] Consent: Verbal consent obtained. The procedure was performed in an emergent situation. Risks and benefits: risks, benefits and alternatives were discussed Consent given by: patient Patient understanding: patient states understanding of the procedure being performed Patient consent: the patient's understanding of the procedure matches consent given Procedure consent: procedure consent matches procedure scheduled Relevant documents: relevant documents present and verified Test results: test results available and properly labeled Site marked: the operative site was marked Imaging studies: imaging studies available Required items: required blood products, implants, devices, and special equipment available Patient identity confirmed: verbally with patient Time out: Immediately prior to procedure a "time out" was called to verify the correct patient, procedure, equipment, support staff and site/side marked as required. Preparation: Patient was prepped and draped in the usual sterile fashion. Anesthesia: see MAR for details Patient sedated: no Treatment site: right anterior Repair method: nasal balloon Post-procedure assessment: bleeding stopped Treatment complexity: complex Patient tolerance: Patient tolerated the procedure well with no immediate complications.   (including critical care time) 9:27 PM-Discussed treatment plan which includes meds with pt at bedside and pt agreed to plan.  9:41PM: Recheck: Pt still actively bleeding from her nose. Discussed administering med, cocaine 4 % topical solution 4 mL Once to help stop the bleeding. Explained benefits of meds. Pt agrees to plan.  This drug was administered by [name], MD at 9:43 PM. Details:  Site: right nasal   Route: nebulized  11:13 PM Bleeding stopped MDM  This patient on multiple blood thinning  medications presents with ongoing bleed. Patient was initially hypertensive, though this seems improved by the time of my evaluation. After initial visualization, and one attempt at treatment with cocaine, patient required placement of nasal packing for definitive stoppage of her bleeding. This was well-tolerated. Patient was  discharged in stable condition.   [name], MD 07/28/13 [phone]

## 2013-07-28 NOTE — ED Notes (Signed)
Pt had had total of 5 nosebleeds today. When ems arrived pt's bp was 212/102.

## 2013-07-28 NOTE — ED Notes (Signed)
Pt's nose has stopped bleeding at this time

## 2013-07-28 NOTE — ED Notes (Signed)
No bleeding noted at this time

## 2013-07-28 NOTE — Discharge Instructions (Signed)
As discussed, it is important to monitor your condition currently, and be sure to followup with our ENT specialists in 3 or 4 days.  Return here for any concerning changes in your condition.

## 2013-07-29 MED ORDER — HYDROCODONE-ACETAMINOPHEN 5-325 MG PO TABS
1.0000 | ORAL_TABLET | Freq: Once | ORAL | Status: AC
  Administered 2013-07-29: 1 via ORAL
  Filled 2013-07-29: qty 1

## 2013-08-02 ENCOUNTER — Encounter (HOSPITAL_COMMUNITY): Payer: Self-pay | Admitting: Emergency Medicine

## 2013-08-02 ENCOUNTER — Inpatient Hospital Stay (HOSPITAL_COMMUNITY)
Admission: EM | Admit: 2013-08-02 | Discharge: 2013-08-04 | DRG: 683 | Disposition: A | Discharge status: Home / Self Care | Payer: 59 | Attending: Family Medicine | Admitting: Family Medicine

## 2013-08-02 ENCOUNTER — Telehealth: Payer: Self-pay | Admitting: Cardiology

## 2013-08-02 DIAGNOSIS — I2699 Other pulmonary embolism without acute cor pulmonale: Secondary | ICD-10-CM | POA: Diagnosis present

## 2013-08-02 DIAGNOSIS — E7800 Pure hypercholesterolemia, unspecified: Secondary | ICD-10-CM | POA: Diagnosis present

## 2013-08-02 DIAGNOSIS — N289 Disorder of kidney and ureter, unspecified: Secondary | ICD-10-CM

## 2013-08-02 DIAGNOSIS — I252 Old myocardial infarction: Secondary | ICD-10-CM

## 2013-08-02 DIAGNOSIS — J42 Unspecified chronic bronchitis: Secondary | ICD-10-CM

## 2013-08-02 DIAGNOSIS — Z8249 Family history of ischemic heart disease and other diseases of the circulatory system: Secondary | ICD-10-CM

## 2013-08-02 DIAGNOSIS — Z6841 Body Mass Index (BMI) 40.0 and over, adult: Secondary | ICD-10-CM

## 2013-08-02 DIAGNOSIS — I9589 Other hypotension: Secondary | ICD-10-CM

## 2013-08-02 DIAGNOSIS — R040 Epistaxis: Secondary | ICD-10-CM | POA: Diagnosis present

## 2013-08-02 DIAGNOSIS — Z8262 Family history of osteoporosis: Secondary | ICD-10-CM

## 2013-08-02 DIAGNOSIS — J449 Chronic obstructive pulmonary disease, unspecified: Secondary | ICD-10-CM | POA: Diagnosis present

## 2013-08-02 DIAGNOSIS — N179 Acute kidney failure, unspecified: Principal | ICD-10-CM | POA: Diagnosis present

## 2013-08-02 DIAGNOSIS — E039 Hypothyroidism, unspecified: Secondary | ICD-10-CM | POA: Diagnosis present

## 2013-08-02 DIAGNOSIS — E860 Dehydration: Secondary | ICD-10-CM | POA: Diagnosis present

## 2013-08-02 DIAGNOSIS — I10 Essential (primary) hypertension: Secondary | ICD-10-CM | POA: Diagnosis present

## 2013-08-02 DIAGNOSIS — Z8543 Personal history of malignant neoplasm of ovary: Secondary | ICD-10-CM

## 2013-08-02 DIAGNOSIS — I959 Hypotension, unspecified: Secondary | ICD-10-CM | POA: Diagnosis present

## 2013-08-02 DIAGNOSIS — Z87891 Personal history of nicotine dependence: Secondary | ICD-10-CM

## 2013-08-02 DIAGNOSIS — I2510 Atherosclerotic heart disease of native coronary artery without angina pectoris: Secondary | ICD-10-CM | POA: Diagnosis present

## 2013-08-02 DIAGNOSIS — J4489 Other specified chronic obstructive pulmonary disease: Secondary | ICD-10-CM | POA: Diagnosis present

## 2013-08-02 DIAGNOSIS — E119 Type 2 diabetes mellitus without complications: Secondary | ICD-10-CM | POA: Diagnosis present

## 2013-08-02 DIAGNOSIS — Z86711 Personal history of pulmonary embolism: Secondary | ICD-10-CM

## 2013-08-02 DIAGNOSIS — E782 Mixed hyperlipidemia: Secondary | ICD-10-CM | POA: Diagnosis present

## 2013-08-02 LAB — BASIC METABOLIC PANEL
Anion gap: 18 — ABNORMAL HIGH (ref 5–15)
BUN: 66 mg/dL — ABNORMAL HIGH (ref 6–23)
CALCIUM: 9.1 mg/dL (ref 8.4–10.5)
CO2: 23 mEq/L (ref 19–32)
Chloride: 98 mEq/L (ref 96–112)
Creatinine, Ser: 2.23 mg/dL — ABNORMAL HIGH (ref 0.50–1.10)
GFR, EST AFRICAN AMERICAN: 26 mL/min — AB (ref >90)
GFR, EST NON AFRICAN AMERICAN: 22 mL/min — AB (ref >90)
Glucose, Bld: 93 mg/dL (ref 70–99)
Potassium: 3.8 mEq/L (ref 3.7–5.3)
SODIUM: 139 meq/L (ref 137–147)

## 2013-08-02 LAB — CBC WITH DIFFERENTIAL/PLATELET
BASOS PCT: 0 % (ref 0–1)
Basophils Absolute: 0 10*3/uL (ref 0.0–0.1)
EOS ABS: 0.1 10*3/uL (ref 0.0–0.7)
Eosinophils Relative: 1 % (ref 0–5)
HCT: 37.4 % (ref 36.0–46.0)
Hemoglobin: 12.6 g/dL (ref 12.0–15.0)
Lymphocytes Relative: 14 % (ref 12–46)
Lymphs Abs: 1.5 10*3/uL (ref 0.7–4.0)
MCH: 27.2 pg (ref 26.0–34.0)
MCHC: 33.7 g/dL (ref 30.0–36.0)
MCV: 80.6 fL (ref 78.0–100.0)
Monocytes Absolute: 1 10*3/uL (ref 0.1–1.0)
Monocytes Relative: 9 % (ref 3–12)
NEUTROS PCT: 76 % (ref 43–77)
Neutro Abs: 7.9 10*3/uL — ABNORMAL HIGH (ref 1.7–7.7)
Platelets: 253 10*3/uL (ref 150–400)
RBC: 4.64 MIL/uL (ref 3.87–5.11)
RDW: 13.8 % (ref 11.5–15.5)
WBC: 10.5 10*3/uL (ref 4.0–10.5)

## 2013-08-02 LAB — PROTIME-INR
INR: 1.08 (ref 0.00–1.49)
PROTHROMBIN TIME: 14 s (ref 11.6–15.2)

## 2013-08-02 LAB — APTT: APTT: 33 s (ref 24–37)

## 2013-08-02 MED ORDER — FERROUS SULFATE 325 (65 FE) MG PO TABS
325.0000 mg | ORAL_TABLET | Freq: Every day | ORAL | Status: DC
  Administered 2013-08-03 – 2013-08-04 (×2): 325 mg via ORAL
  Filled 2013-08-02 (×2): qty 1

## 2013-08-02 MED ORDER — HYDROCODONE-ACETAMINOPHEN 5-325 MG PO TABS
1.0000 | ORAL_TABLET | Freq: Two times a day (BID) | ORAL | Status: DC
  Administered 2013-08-03 – 2013-08-04 (×3): 1 via ORAL
  Filled 2013-08-02 (×4): qty 1

## 2013-08-02 MED ORDER — MOMETASONE FURO-FORMOTEROL FUM 100-5 MCG/ACT IN AERO
2.0000 | INHALATION_SPRAY | Freq: Two times a day (BID) | RESPIRATORY_TRACT | Status: DC
  Administered 2013-08-03 – 2013-08-04 (×2): 2 via RESPIRATORY_TRACT
  Filled 2013-08-02: qty 8.8

## 2013-08-02 MED ORDER — ATORVASTATIN CALCIUM 20 MG PO TABS: 20.0000 mg | ORAL_TABLET | Freq: Every day | ORAL | Status: DC

## 2013-08-02 MED ORDER — IPRATROPIUM BROMIDE 0.02 % IN SOLN: 0.5000 mg | RESPIRATORY_TRACT | Status: DC | PRN

## 2013-08-02 MED ORDER — GLIPIZIDE 5 MG PO TABS
2.5000 mg | ORAL_TABLET | Freq: Two times a day (BID) | ORAL | Status: DC
  Administered 2013-08-03: 2.5 mg via ORAL
  Filled 2013-08-02 (×4): qty 1

## 2013-08-02 MED ORDER — ALPRAZOLAM 1 MG PO TABS
1.0000 mg | ORAL_TABLET | Freq: Every evening | ORAL | Status: DC | PRN
  Administered 2013-08-02 – 2013-08-03 (×2): 1 mg via ORAL
  Filled 2013-08-02 (×2): qty 1

## 2013-08-02 MED ORDER — PANTOPRAZOLE SODIUM 40 MG PO TBEC
40.0000 mg | DELAYED_RELEASE_TABLET | Freq: Every day | ORAL | Status: DC
  Administered 2013-08-03: 40 mg via ORAL
  Filled 2013-08-02 (×2): qty 1

## 2013-08-02 MED ORDER — ALBUTEROL SULFATE (2.5 MG/3ML) 0.083% IN NEBU: 2.5000 mg | INHALATION_SOLUTION | RESPIRATORY_TRACT | Status: DC | PRN

## 2013-08-02 MED ORDER — LEVOTHYROXINE SODIUM 75 MCG PO TABS
150.0000 ug | ORAL_TABLET | ORAL | Status: DC
  Administered 2013-08-03 – 2013-08-04 (×2): 150 ug via ORAL
  Filled 2013-08-02 (×2): qty 2

## 2013-08-02 MED ORDER — NITROGLYCERIN 0.4 MG SL SUBL: 0.4000 mg | SUBLINGUAL_TABLET | SUBLINGUAL | Status: DC | PRN

## 2013-08-02 MED ORDER — SODIUM CHLORIDE 0.9 % IV BOLUS (SEPSIS)
700.0000 mL | Freq: Once | INTRAVENOUS | Status: AC
  Administered 2013-08-02: 700 mL via INTRAVENOUS

## 2013-08-02 MED ORDER — INSULIN ASPART 100 UNIT/ML ~~LOC~~ SOLN
0.0000 [IU] | SUBCUTANEOUS | Status: DC
  Administered 2013-08-04: 3 [IU] via SUBCUTANEOUS
  Administered 2013-08-04: 2 [IU] via SUBCUTANEOUS

## 2013-08-02 MED ORDER — IRON 325 (65 FE) MG PO TABS: 1.0000 | ORAL_TABLET | Freq: Every day | ORAL | Status: DC

## 2013-08-02 MED ORDER — CALCIUM CARBONATE-VITAMIN D 500-200 MG-UNIT PO TABS
1.0000 | ORAL_TABLET | Freq: Every day | ORAL | Status: DC
  Administered 2013-08-03 – 2013-08-04 (×2): 1 via ORAL
  Filled 2013-08-02 (×2): qty 1

## 2013-08-02 MED ORDER — SODIUM CHLORIDE 0.9 % IV SOLN
INTRAVENOUS | Status: DC
  Administered 2013-08-02 – 2013-08-04 (×3): via INTRAVENOUS

## 2013-08-02 MED ORDER — ASPIRIN EC 81 MG PO TBEC
81.0000 mg | DELAYED_RELEASE_TABLET | Freq: Every day | ORAL | Status: DC
  Administered 2013-08-03 – 2013-08-04 (×2): 81 mg via ORAL
  Filled 2013-08-02 (×2): qty 1

## 2013-08-02 NOTE — H&P (Signed)
[facility] History and Physical  [name] QIH:[id number] DOB: Mar 17, 1950    PCP:   Delphina Cahill, MD   Chief Complaint: lightheadedness.  HPI: [name] is an 63 y.o. female with hx of known CAD, s/p cardiac stent placement a few years ago, on ASA and Effient, hx of DM2 on oral hypoglycemic agents including Metformin, hx of prior AKI which resolved, HTN on Benicar, diuretic, and Norvasc, presented to the ER as she has been feeling lightheaded and having SBP low taken at home.  She said it was in the 80's.  She had severe nosebleed, and had require nasal packing, which was removed by ENT (Dr Benjamine Mola), which also started bleeding requiring cauterization.  Her Effient was therefore held by ENT yesterday.  She denied CP, fever, chills, palpitation or SOB.  Evalaution in the ER showed Hb of 12 grams per dL, with hypotension (SBP 80's), but rose of 120 with IV bolus. She was found however to be in AKI again with BUN 60's and Cr of 2.23 with nornal K.  Her baseline Cr was less than 1.0 six months ago.  Hospitalist was asked to admit her for AKI, likely from volume depletion and hypotension.  Rewiew of Systems:  Constitutional: Negative for malaise, fever and chills. No significant weight loss or weight gain Eyes: Negative for eye pain, redness and discharge, diplopia, visual changes, or flashes of light. ENMT: Negative for ear pain, hoarseness, nasal congestion, sinus pressure and sore throat. No headaches; tinnitus, drooling, or problem swallowing. Cardiovascular: Negative for chest pain, palpitations, diaphoresis, dyspnea and peripheral edema. ; No orthopnea, PND Respiratory: Negative for cough, hemoptysis, wheezing and stridor. No pleuritic chestpain. Gastrointestinal: Negative for nausea, vomiting, diarrhea, constipation, abdominal pain, melena, blood in stool, hematemesis, jaundice and rectal bleeding.    Genitourinary: Negative for frequency, dysuria, incontinence,flank  pain and hematuria; Musculoskeletal: Negative for back pain and neck pain. Negative for swelling and trauma.;  Skin: . Negative for pruritus, rash, abrasions, bruising and skin lesion.; ulcerations Neuro: Negative for headache,  and neck stiffness. Negative for weakness, altered level of consciousness , altered mental status, extremity weakness, burning feet, involuntary movement, seizure and syncope.  Psych: negative for anxiety, depression, insomnia, tearfulness, panic attacks, hallucinations, paranoia, suicidal or homicidal ideation    Past Medical History  Diagnosis Date  . Type 2 diabetes mellitus   . Essential hypertension, benign   . Pulmonary embolism     Recurrent, previously on Coumadin  . Hiatal hernia   . Ovarian cancer   . COPD (chronic obstructive pulmonary disease)   . NSTEMI (non-ST elevated myocardial infarction)   . Coronary atherosclerosis of native coronary artery     Multivessel, DES distal left main, unsuccessful PCI circumflex 2/13, LVEF 50-55%  . Hypothyroidism   . Mixed hyperlipidemia   . Overweight(278.02)   . Ejection fraction     EF 50-55%, echo, March 15, 2011, question of mild posterior hypokinesis, technically difficult study despite the use of contrast    Past Surgical History  Procedure Laterality Date  . Abdominal surgery    . Cholecystectomy    . Appendectomy    . Cesarean section    . Tonsillectomy    . Breast lumpectomy    . Small intestine surgery  12/2007    Dr [name]  . Closure of abdominal wound/wound vac    . Cataract extraction w/phaco  01/29/2011    Procedure: CATARACT EXTRACTION PHACO AND INTRAOCULAR LENS PLACEMENT (IOC);  Surgeon: [name];  Location: [facility];  Service: Ophthalmology;  Laterality: Left;  CDE: 1.81    Medications:  HOME MEDS: Prior to Admission medications   Medication Sig Start Date End Date Taking? Authorizing Provider  ACCU-CHEK FASTCLIX LANCETS [place]  01/06/13  Yes Historical Provider, MD  ACCU-CHEK  SMARTVIEW test strip  01/20/13  Yes Historical Provider, MD  albuterol (PROVENTIL) (2.5 MG/3ML) 0.083% nebulizer solution Take 2.5 mg by nebulization every 6 (six) hours as needed. For wheezing. Combines with atrovent   Yes Historical Provider, MD  ALPRAZolam (XANAX) 1 MG tablet Take 1 mg by mouth at bedtime as needed for anxiety or sleep.    Yes Historical Provider, MD  amLODipine (NORVASC) 10 MG tablet Take 10 mg by mouth daily. 08/11/10  Yes Nimish Luther Parody, MD  aspirin EC 81 MG tablet Take 1 tablet (81 mg total) by mouth daily. 02/02/13  Yes Lezlie Octave Black, NP  calcium-vitamin D (OSCAL WITH D) 500-200 MG-UNIT per tablet Take 1 tablet by mouth daily.    Yes Historical Provider, MD  carvedilol (COREG) 6.25 MG tablet Take 1 tablet (6.25 mg total) by mouth 2 (two) times daily with a meal. 02/21/13  Yes Satira Sark, MD  EFFIENT 10 MG TABS tablet TAKE 1 TABLET (10 MG TOTAL) BY MOUTH DAILY.   Yes Satira Sark, MD  Ferrous Sulfate (IRON) 325 (65 FE) MG TABS Take 1 tablet by mouth daily after breakfast.    Yes Historical Provider, MD  Fluticasone-Salmeterol (ADVAIR) 250-50 MCG/DOSE AEPB Inhale 1 puff into the lungs 2 (two) times daily. 08/11/10  Yes Nimish Luther Parody, MD  glipiZIDE-metformin (METAGLIP) 2.5-500 MG per tablet Take 1 tablet by mouth 2 (two) times daily before a meal. Please hold your Metformin for 48hrs after procedure. Resume on 03/21/11 03/20/11  Yes Jessica A Hope, PA-C  HYDROcodone-acetaminophen (NORCO) 5-325 MG per tablet Take 1 tablet by mouth 2 (two) times daily. For pain   Yes Historical Provider, MD  ipratropium (ATROVENT) 0.02 % nebulizer solution Take 500 mcg by nebulization every 6 (six) hours as needed. For shortness of breath. Combines it with Albuterol 08/11/10  Yes Nimish Luther Parody, MD  levothyroxine (SYNTHROID, LEVOTHROID) 150 MCG tablet Take 1 tablet (150 mcg total) by mouth every morning. 03/20/11  Yes Jessica A Hope, PA-C  loratadine (CLARITIN) 10 MG tablet Take 10 mg by  mouth every morning.    Yes Historical Provider, MD  meclizine (ANTIVERT) 25 MG tablet Take 25 mg by mouth 2 (two) times daily.    Yes Historical Provider, MD  nitroGLYCERIN (NITROSTAT) 0.4 MG SL tablet Place 1 tablet (0.4 mg total) under the tongue every 5 (five) minutes as needed for chest pain (up to 3 doses). 03/20/11 08/02/13 Yes Jessica A Hope, PA-C  olmesartan (BENICAR) 40 MG tablet Take 40 mg by mouth every morning.    Yes Historical Provider, MD  pantoprazole (PROTONIX) 40 MG tablet TAKE 2 TABLETS BY MOUTH TWICE A DAY BEFORE MEALS 06/02/13  Yes Satira Sark, MD  prasugrel (EFFIENT) 10 MG TABS tablet Take 10 mg by mouth every morning.   Yes Historical Provider, MD  Prenatal Vit-Fe Fumarate-FA (PRENATAL MULTIVITAMIN) TABS Take 1 tablet by mouth daily.     Yes Historical Provider, MD  rosuvastatin (CRESTOR) 20 MG tablet Take 2 tablets (40 mg total) by mouth daily. 02/21/13  Yes Satira Sark, MD  saxagliptin HCl (ONGLYZA) 5 MG TABS tablet Take 5 mg by mouth every morning.    Yes Historical Provider,  MD  triamterene-hydrochlorothiazide (DYAZIDE) 37.5-25 MG per capsule Take 1 capsule by mouth every morning.     Yes Historical Provider, MD     Allergies:  Allergies  Allergen Reactions  . Codeine Shortness Of Breath and Itching  . Lipitor [Atorvastatin]   . Penicillins Rash  . Sulfa Antibiotics Rash    Social History:   reports that she quit smoking about 33 years ago. Her smoking use included Cigarettes. She smoked 0.00 packs per day. She has never used smokeless tobacco. She reports that she does not drink alcohol or use illicit drugs.  Family History: Family History  Problem Relation Age of Onset  . Coronary artery disease Father     MI at age 23  . Heart attack Father   . Coronary artery disease Mother     CABG at age 43  . Heart attack Mother   . Cirrhosis Sister   . Fibromyalgia Sister   . Osteoporosis Sister      Physical Exam: Filed Vitals:   08/02/13 1900  08/02/13 1930 08/02/13 2000 08/02/13 2012  BP: 120/56 120/54 126/53 126/53  Pulse: 80 81 83 80  Temp:      TempSrc:      Resp: 19 27 21 15   Height:      Weight:      SpO2: 100% 100% 100% 98%   Blood pressure 126/53, pulse 80, temperature 97.9 F (36.6 C), temperature source Oral, resp. rate 15, height 5\' 1"  (1.549 m), weight 126.554 kg (279 lb), SpO2 98.00%.  GEN:  Pleasant patient lying in the stretcher in no acute distress; cooperative with exam. PSYCH:  alert and oriented x4; does not appear anxious or depressed; affect is appropriate. HEENT: Mucous membranes pink and anicteric; PERRLA; EOM intact; no cervical lymphadenopathy nor thyromegaly or carotid bruit; no JVD; There were no stridor. Neck is very supple. Breasts:: Not examined CHEST WALL: No tenderness CHEST: Normal respiration, clear to auscultation bilaterally.  HEART: Regular rate and rhythm.  There are no murmur, rub, or gallops.   BACK: No kyphosis or scoliosis; no CVA tenderness ABDOMEN: soft and non-tender; no masses, no organomegaly, normal abdominal bowel sounds; no pannus; no intertriginous candida. There is no rebound and no distention.  Her abdomen is obese Rectal Exam: Not done EXTREMITIES: No bone or joint deformity; age-appropriate arthropathy of the hands and knees; no edema; no ulcerations.  There is no calf tenderness. Genitalia: not examined PULSES: 2+ and symmetric SKIN: Normal hydration no rash or ulceration CNS: Cranial nerves 2-12 grossly intact no focal lateralizing neurologic deficit.  Speech is fluent; uvula elevated with phonation, facial symmetry and tongue midline. DTR are normal bilaterally, cerebella exam is intact, barbinski is negative and strengths are equaled bilaterally.  No sensory loss.   Labs on Admission:  Basic Metabolic Panel:  Recent Labs Lab 08/02/13 1833  NA 139  K 3.8  CL 98  CO2 23  GLUCOSE 93  BUN 66*  CREATININE 2.23*  CALCIUM 9.1   Liver Function Tests: No results  found for this basename: AST, ALT, ALKPHOS, BILITOT, PROT, ALBUMIN,  in the last 168 hours No results found for this basename: LIPASE, AMYLASE,  in the last 168 hours No results found for this basename: AMMONIA,  in the last 168 hours CBC:  Recent Labs Lab 08/02/13 1833  WBC 10.5  NEUTROABS 7.9*  HGB 12.6  HCT 37.4  MCV 80.6  PLT 253     Assessment/Plan Present on Admission:  . AKI (acute  kidney injury) . Epistaxis . Type 2 diabetes mellitus . Morbid obesity . Coronary atherosclerosis of native coronary artery . Essential hypertension, benign . COPD (chronic obstructive pulmonary disease) . Hypothyroidism . Recurrent pulmonary emboli  PLAN:  Will admit her for AKI, likely from hypotension, with volume depletion from her nosebleed and diuretics, aggravated with Benacar.  Will hold her Benicar, diuretics, Norvasc and Coreg.  We will resume them as her BP tolerates.  For her DM, Metformin will be held as well.  Will continue her glucotrol only and use SSI.  She will be given IVF and carb modified diet.  I suspect her kidney fx will improve quickly.  Her Effient will be held and I will continue her ASA.  For her hypothyroidism, will continue her synthroid, and check TSH.  Her COPD is stable, continue with her nebs.  She is stable, full code, and will be admitted to general medical floor under hospitalist service.   Other plans as per orders.  Code Status: FULL Haskel Khan, MD. [facility] Pager [phone] 7pm to 7am.  08/02/2013, 8:21 PM

## 2013-08-02 NOTE — ED Notes (Signed)
Called for pt x 1 but was in the restroom.

## 2013-08-02 NOTE — Telephone Encounter (Signed)
S.Barker MA spoke with pt earlier today,she c/o dizziness and fatigue and weakness.Pt reported low BP 80/sys and appeared to slur words.Was told to go to ED ,sons were with her at her home  Staff message was sent to [place]

## 2013-08-02 NOTE — ED Notes (Signed)
Pt reports bp today was 77 systolic and felt dizzy.  Reports called Dr. Myles Gip office and was told to come here.  Pt reports was seen here Sat for a nose bleed.  Reports nose bled for 5 hours.  Denies any recent changes to medications.

## 2013-08-02 NOTE — Telephone Encounter (Signed)
Patient would like return phone call regarding ENT taking her off of Effient. / tgs

## 2013-08-02 NOTE — ED Provider Notes (Signed)
CSN: [id number]     Arrival date & time 08/02/13  1649 History  This chart was scribed for Janice Norrie, MD by Roxan Diesel, ED scribe.  This patient was seen in room APA05/APA05 and the patient's care was started at 5:49 PM.   Chief Complaint  Patient presents with  . Hypotension  . Dizziness    The history is provided by the patient. No language interpreter was used.    HPI Comments: [name] is a 63 y.o. female with h/o NSTEMI, stent placement (2013, on anticoagulants since then) coronary atherosclerosis of native coronary artery, COPD, DM, HTN, hyperlipidemia, morbid obesity, recurrent PE, and hypothyroidism, who presents to the Emergency Department complaining of low BP that has been ongoing all day today with associated dizziness over the past several hours and dark stools.  Pt had a 5-hour right-sided nosebleed 5 days and was brought here after EMS was called and found her BP to be 204/102 on arrival.  She had the nose packed in the ED and bleeding stopped so she was sent home.  However she saw ENT Dr. Benjamine Mola yesterday and when he pulled the packing out it began bleeding again, so he cauterized the area three times before bleeding resolved.  She was also taken off of her Effient temporarily by Dr. Benjamine Mola yesterday due to her recent nosebleeds.  She is still taking her 81mg  aspirin.  Otherwise pt was doing well yesterday and felt well this morning.  However her BP has been low all day, ranging from "77/something" to "eighty-something/50."  She was still feeling well until 2 PM when she became dizzy.  She also feels generally weak when she stands up although she denies weakness when lying down.  She also states she has had dark charcoal-colored stools today.  She denies abdominal pain, weakness when lying down, SOB, or CP.  She has been eating slightly less than usual due to having her nose packed but is drinking.  Pt does not smoke.  She lives at home with her 2 sons.  She states her  sugar has been well-controlled recently to her knowledge.  PCP is Dr. Delphina Cahill Cardiologist is Dr. Domenic Polite   Past Medical History  Diagnosis Date  . Type 2 diabetes mellitus   . Essential hypertension, benign   . Pulmonary embolism     Recurrent, previously on Coumadin  . Hiatal hernia   . Ovarian cancer   . COPD (chronic obstructive pulmonary disease)   . NSTEMI (non-ST elevated myocardial infarction)   . Coronary atherosclerosis of native coronary artery     Multivessel, DES distal left main, unsuccessful PCI circumflex 2/13, LVEF 50-55%  . Hypothyroidism   . Mixed hyperlipidemia   . Overweight(278.02)   . Ejection fraction     EF 50-55%, echo, March 15, 2011, question of mild posterior hypokinesis, technically difficult study despite the use of contrast    Past Surgical History  Procedure Laterality Date  . Abdominal surgery    . Cholecystectomy    . Appendectomy    . Cesarean section    . Tonsillectomy    . Breast lumpectomy    . Small intestine surgery  12/2007    Dr [name]  . Closure of abdominal wound/wound vac    . Cataract extraction w/phaco  01/29/2011    Procedure: CATARACT EXTRACTION PHACO AND INTRAOCULAR LENS PLACEMENT (IOC);  Surgeon: [name];  Location: [facility];  Service: Ophthalmology;  Laterality: Left;  CDE: 1.81  Family History  Problem Relation Age of Onset  . Coronary artery disease Father     MI at age 24  . Heart attack Father   . Coronary artery disease Mother     CABG at age 29  . Heart attack Mother   . Cirrhosis Sister   . Fibromyalgia Sister   . Osteoporosis Sister     History  Substance Use Topics  . Smoking status: Former Smoker    Types: Cigarettes    Quit date: 01/27/1980  . Smokeless tobacco: Never Used  . Alcohol Use: No  lives at home Lives with sons  OB History   Grav Para Term Preterm Abortions TAB SAB Ect Mult Living                   Review of Systems  HENT: Positive for nosebleeds.   Respiratory:  Negative for shortness of breath.   Cardiovascular: Negative for chest pain.  Gastrointestinal: Negative for abdominal pain.       Dark stools  Neurological: Positive for dizziness and weakness.  All other systems reviewed and are negative.     Allergies  Codeine; Lipitor; Penicillins; and Sulfa antibiotics  Home Medications   Prior to Admission medications   Medication Sig Start Date End Date Taking? Authorizing Provider  albuterol (PROVENTIL) (2.5 MG/3ML) 0.083% nebulizer solution Take 2.5 mg by nebulization every 6 (six) hours as needed. For wheezing. Combines with atrovent   Yes Historical Provider, MD  Fluticasone-Salmeterol (ADVAIR) 250-50 MCG/DOSE AEPB Inhale 1 puff into the lungs 2 (two) times daily. 08/11/10  Yes Nimish Luther Parody, MD  glipiZIDE-metformin (METAGLIP) 2.5-500 MG per tablet Take 1 tablet by mouth 2 (two) times daily before a meal. Please hold your Metformin for 48hrs after procedure. Resume on 03/21/11 03/20/11  Yes Jessica A Hope, PA-C  prasugrel (EFFIENT) 10 MG TABS tablet Take 10 mg by mouth every morning.   Yes Historical Provider, MD  rosuvastatin (CRESTOR) 20 MG tablet Take 2 tablets (40 mg total) by mouth daily. 02/21/13  Yes Satira Sark, MD  saxagliptin HCl (ONGLYZA) 5 MG TABS tablet Take 5 mg by mouth every morning.    Yes Historical Provider, MD  triamterene-hydrochlorothiazide (DYAZIDE) 37.5-25 MG per capsule Take 1 capsule by mouth every morning.     Yes Historical Provider, MD  ACCU-CHEK FASTCLIX LANCETS [place]  01/06/13   Historical Provider, MD  ACCU-CHEK SMARTVIEW test strip  01/20/13   Historical Provider, MD  ALPRAZolam Duanne Moron) 1 MG tablet Take 1 mg by mouth at bedtime as needed for anxiety or sleep.     Historical Provider, MD  amLODipine (NORVASC) 10 MG tablet Take 10 mg by mouth daily. 08/11/10   Nimish Luther Parody, MD  aspirin EC 81 MG tablet Take 1 tablet (81 mg total) by mouth daily. 02/02/13   Radene Gunning, NP  calcium-vitamin D (OSCAL WITH D)  500-200 MG-UNIT per tablet Take 1 tablet by mouth daily.     Historical Provider, MD  carvedilol (COREG) 6.25 MG tablet Take 1 tablet (6.25 mg total) by mouth 2 (two) times daily with a meal. 02/21/13   Satira Sark, MD  EFFIENT 10 MG TABS tablet TAKE 1 TABLET (10 MG TOTAL) BY MOUTH DAILY. 06/27/13   Satira Sark, MD  EFFIENT 10 MG TABS tablet TAKE 1 TABLET (10 MG TOTAL) BY MOUTH DAILY.    Satira Sark, MD  Ferrous Sulfate (IRON) 325 (65 FE) MG TABS Take 1 tablet by  mouth daily after breakfast.     Historical Provider, MD  HYDROcodone-acetaminophen (NORCO) 5-325 MG per tablet Take 1 tablet by mouth 2 (two) times daily. For pain    Historical Provider, MD  ipratropium (ATROVENT) 0.02 % nebulizer solution Take 500 mcg by nebulization every 6 (six) hours as needed. For shortness of breath. Combines it with Albuterol 08/11/10   Nimish Luther Parody, MD  levothyroxine (SYNTHROID, LEVOTHROID) 150 MCG tablet Take 1 tablet (150 mcg total) by mouth every morning. 03/20/11   Jessica A Hope, PA-C  loratadine (CLARITIN) 10 MG tablet Take 10 mg by mouth every morning.     Historical Provider, MD  meclizine (ANTIVERT) 25 MG tablet Take 25 mg by mouth 2 (two) times daily.     Historical Provider, MD  nitroGLYCERIN (NITROSTAT) 0.4 MG SL tablet Place 1 tablet (0.4 mg total) under the tongue every 5 (five) minutes as needed for chest pain (up to 3 doses). 03/20/11 01/31/13  Jessica A Hope, PA-C  olmesartan (BENICAR) 40 MG tablet Take 40 mg by mouth every morning.     Historical Provider, MD  pantoprazole (PROTONIX) 40 MG tablet TAKE 2 TABLETS BY MOUTH TWICE A DAY BEFORE MEALS 06/02/13   Satira Sark, MD  Prenatal Vit-Fe Fumarate-FA (PRENATAL MULTIVITAMIN) TABS Take 1 tablet by mouth daily.      Historical Provider, MD   BP 99/52  Pulse 82  Temp(Src) 97.9 F (36.6 C) (Oral)  Resp 18  Ht 5\' 1"  (1.549 m)  Wt 279 lb (126.554 kg)  BMI 52.74 kg/m2  SpO2 99%  Vital signs normal except borderline  hypotension   Physical Exam  Nursing note and vitals reviewed. Constitutional: She is oriented to person, place, and time. She appears well-developed and well-nourished.  Non-toxic appearance. She does not appear ill. No distress.  HENT:  Head: Normocephalic and atraumatic.  Right Ear: External ear normal.  Left Ear: External ear normal.  Nose: Nose normal. No mucosal edema or rhinorrhea.  Mouth/Throat: Oropharynx is clear and moist and mucous membranes are normal. No dental abscesses or uvula swelling.  Eyes: EOM are normal. Pupils are equal, round, and reactive to light.  Conjunctiva pallor  Neck: Normal range of motion and full passive range of motion without pain. Neck supple.  Cardiovascular: Normal rate, regular rhythm and normal heart sounds.  Exam reveals no gallop and no friction rub.   No murmur heard. Pulmonary/Chest: Effort normal and breath sounds normal. No respiratory distress. She has no wheezes. She has no rhonchi. She has no rales. She exhibits no tenderness and no crepitus.  Abdominal: Soft. Normal appearance and bowel sounds are normal. She exhibits no distension. There is no tenderness. There is no rebound and no guarding.  Musculoskeletal: Normal range of motion. She exhibits no edema and no tenderness.  Moves all extremities well.   Neurological: She is alert and oriented to person, place, and time. She has normal strength. No cranial nerve deficit.  Skin: Skin is warm, dry and intact. No rash noted. No erythema. There is pallor.  Very pale skin  Psychiatric: She has a normal mood and affect. Her speech is normal and behavior is normal. Her mood appears not anxious.    ED Course  Procedures (including critical care time)  Medications  sodium chloride 0.9 % bolus 700 mL (0 mLs Intravenous Stopped 08/02/13 1940)    DIAGNOSTIC STUDIES: Oxygen Saturation is 99% on room air, normal by my interpretation.    COORDINATION OF CARE: 5:57 PM-Discussed  treatment plan  which includes IV fluids, labs and EKG with pt at bedside and pt agreed to plan.   Discussed her test results. Pt agreeable for admission for IV fluids for her dehydration.   19:52 Dr Marin Comment will see patient in ED.   Results for orders placed during the hospital encounter of 08/02/13  CBC WITH DIFFERENTIAL      Result Value Ref Range   WBC 10.5  4.0 - 10.5 K/uL   RBC 4.64  3.87 - 5.11 MIL/uL   Hemoglobin 12.6  12.0 - 15.0 g/dL   HCT 37.4  36.0 - 46.0 %   MCV 80.6  78.0 - 100.0 fL   MCH 27.2  26.0 - 34.0 pg   MCHC 33.7  30.0 - 36.0 g/dL   RDW 13.8  11.5 - 15.5 %   Platelets 253  150 - 400 K/uL   Neutrophils Relative % 76  43 - 77 %   Neutro Abs 7.9 (*) 1.7 - 7.7 K/uL   Lymphocytes Relative 14  12 - 46 %   Lymphs Abs 1.5  0.7 - 4.0 K/uL   Monocytes Relative 9  3 - 12 %   Monocytes Absolute 1.0  0.1 - 1.0 K/uL   Eosinophils Relative 1  0 - 5 %   Eosinophils Absolute 0.1  0.0 - 0.7 K/uL   Basophils Relative 0  0 - 1 %   Basophils Absolute 0.0  0.0 - 0.1 K/uL  BASIC METABOLIC PANEL      Result Value Ref Range   Sodium 139  137 - 147 mEq/L   Potassium 3.8  3.7 - 5.3 mEq/L   Chloride 98  96 - 112 mEq/L   CO2 23  19 - 32 mEq/L   Glucose, Bld 93  70 - 99 mg/dL   BUN 66 (*) 6 - 23 mg/dL   Creatinine, Ser 2.23 (*) 0.50 - 1.10 mg/dL   Calcium 9.1  8.4 - 10.5 mg/dL   GFR calc non Af Amer 22 (*) >90 mL/min   GFR calc Af Amer 26 (*) >90 mL/min   Anion gap 18 (*) 5 - 15  APTT      Result Value Ref Range   aPTT 33  24 - 37 seconds  PROTIME-INR      Result Value Ref Range   Prothrombin Time 14.0  11.6 - 15.2 seconds   INR 1.08  0.00 - 1.49   Laboratory interpretation all normal except new acute renal insufficiency since January when her BUN and creatinine were normal    EKG Interpretation None       Date: 08/02/2013  Rate: 77  Rhythm: normal sinus rhythm  QRS Axis: normal  Intervals: normal  ST/T Wave abnormalities: normal  Conduction Disutrbances:none  Narrative  Interpretation:   Old EKG Reviewed: none available    MDM   patient presents with hypotension after having epistaxis and decreased oral intake. She is found to be dehydrated with a new acute renal insufficiency. She's been admitted for IV fluid hydration and hopefully improvement of her renal function.    Final diagnoses:  Other specified hypotension  Dehydration  Acute renal insufficiency    Plan admission   Rolland Porter, MD, FACEP   I personally performed the services described in this documentation, which was scribed in my presence. The recorded information has been reviewed and considered.    Janice Norrie, MD 08/02/13 2003

## 2013-08-03 DIAGNOSIS — I9589 Other hypotension: Secondary | ICD-10-CM

## 2013-08-03 DIAGNOSIS — N179 Acute kidney failure, unspecified: Secondary | ICD-10-CM | POA: Diagnosis present

## 2013-08-03 LAB — BASIC METABOLIC PANEL
Anion gap: 15 (ref 5–15)
BUN: 56 mg/dL — AB (ref 6–23)
CALCIUM: 8.8 mg/dL (ref 8.4–10.5)
CO2: 23 mEq/L (ref 19–32)
Chloride: 103 mEq/L (ref 96–112)
Creatinine, Ser: 1.46 mg/dL — ABNORMAL HIGH (ref 0.50–1.10)
GFR calc Af Amer: 43 mL/min — ABNORMAL LOW (ref >90)
GFR calc non Af Amer: 37 mL/min — ABNORMAL LOW (ref >90)
GLUCOSE: 99 mg/dL (ref 70–99)
Potassium: 3.6 mEq/L — ABNORMAL LOW (ref 3.7–5.3)
Sodium: 141 mEq/L (ref 137–147)

## 2013-08-03 LAB — CBC
HCT: 35.5 % — ABNORMAL LOW (ref 36.0–46.0)
Hemoglobin: 11.8 g/dL — ABNORMAL LOW (ref 12.0–15.0)
MCH: 26.9 pg (ref 26.0–34.0)
MCHC: 33.2 g/dL (ref 30.0–36.0)
MCV: 81.1 fL (ref 78.0–100.0)
PLATELETS: 292 10*3/uL (ref 150–400)
RBC: 4.38 MIL/uL (ref 3.87–5.11)
RDW: 13.8 % (ref 11.5–15.5)
WBC: 9.5 10*3/uL (ref 4.0–10.5)

## 2013-08-03 LAB — GLUCOSE, CAPILLARY
Glucose-Capillary: 110 mg/dL — ABNORMAL HIGH (ref 70–99)
Glucose-Capillary: 113 mg/dL — ABNORMAL HIGH (ref 70–99)
Glucose-Capillary: 121 mg/dL — ABNORMAL HIGH (ref 70–99)
Glucose-Capillary: 90 mg/dL (ref 70–99)
Glucose-Capillary: 98 mg/dL (ref 70–99)

## 2013-08-03 LAB — TSH: TSH: 1.58 u[IU]/mL (ref 0.350–4.500)

## 2013-08-03 MED ORDER — ROSUVASTATIN CALCIUM 20 MG PO TABS
40.0000 mg | ORAL_TABLET | Freq: Every day | ORAL | Status: DC
  Administered 2013-08-03: 40 mg via ORAL
  Filled 2013-08-03: qty 2

## 2013-08-03 MED ORDER — IPRATROPIUM-ALBUTEROL 0.5-2.5 (3) MG/3ML IN SOLN
3.0000 mL | RESPIRATORY_TRACT | Status: DC | PRN
Start: 2013-08-03 — End: 2013-08-04

## 2013-08-03 MED ORDER — PANTOPRAZOLE SODIUM 40 MG PO TBEC
40.0000 mg | DELAYED_RELEASE_TABLET | Freq: Once | ORAL | Status: AC
  Administered 2013-08-03: 40 mg via ORAL

## 2013-08-03 MED ORDER — PANTOPRAZOLE SODIUM 40 MG PO TBEC
40.0000 mg | DELAYED_RELEASE_TABLET | Freq: Two times a day (BID) | ORAL | Status: DC
  Filled 2013-08-03: qty 1

## 2013-08-03 MED ORDER — PANTOPRAZOLE SODIUM 40 MG PO TBEC
40.0000 mg | DELAYED_RELEASE_TABLET | Freq: Two times a day (BID) | ORAL | Status: DC
  Administered 2013-08-03 – 2013-08-04 (×2): 40 mg via ORAL
  Filled 2013-08-03: qty 1

## 2013-08-03 NOTE — Progress Notes (Signed)
Patient c/o epigastric pain because she did not have her evening dose of protonix. Paged the on-call physician and was given an order to give 40 mg of protonix instead of the patients usual 80 mg d/t the patient being admitted with acute kidney injury. Will follow orders and continue to monitor the patient.

## 2013-08-03 NOTE — Progress Notes (Signed)
Nutrition Brief Note  Patient identified on the Malnutrition Screening Tool (MST) Report  Pt had good appetite prior to admission. Her weight is trending up compared to April.  Wt Readings from Last 15 Encounters:  08/03/13 291 lb 7.2 oz (132.2 kg)  07/28/13 279 lb (126.554 kg)  05/11/13 279 lb (126.554 kg)  02/07/13 282 lb (127.914 kg)  01/31/13 283 lb (128.368 kg)  08/04/12 296 lb (134.265 kg)  02/02/12 302 lb 8 oz (137.213 kg)  07/29/11 310 lb (140.615 kg)  05/01/11 309 lb (140.161 kg)  04/22/11 309 lb 15.5 oz (140.6 kg)  04/03/11 309 lb (140.161 kg)  03/20/11 323 lb 3.1 oz (146.6 kg)  03/20/11 323 lb 3.1 oz (146.6 kg)  03/20/11 323 lb 3.1 oz (146.6 kg)  01/21/11 320 lb (145.151 kg)    Body mass index is 55.1 kg/(m^2). Patient meets criteria for obesity class III based on current BMI.   Current diet order is CHO Modified, patient is consuming approximately n/a% of meals at this time. Labs and medications reviewed.   No nutrition interventions warranted at this time. If nutrition issues arise, please consult RD.   Colman Cater MS,RD,CSG,LDN Office: [phone] Pager: [phone]

## 2013-08-03 NOTE — Progress Notes (Signed)
Note: This document was prepared with digital dictation and possible smart phrase technology. Any transcriptional errors that result from this process are unintentional.   [name] AYT:[id number] DOB: 04-17-1950 DOA: 08/02/2013 PCP: Delphina Cahill, MD  Brief narrative:  63 y/o ? known h/o CAD c PTCI 02/2011 + DES, Morbid obesity, Ty 2 DM, h/o recurrent PE 1980's, Hiatal hernia, hld, Hypothyroidism, prior SBO, H/o Ovarian CA admitted for  Feeling lightheaded and found to have low SBP at home.  SHe was recently seen in ED 07/28/13 for severe Nasal bleed which was treated with cautery 08/02/13 as removal of the nasal packing worsened this Found to have AKI  Creat 0.9-->2.23 and admitted  Past medical history-As per Problem list Chart reviewed as below- reviewed  Consultants:   none  Procedures:  none  Antibiotics:  none   Subjective  Doing fair No n/v/cp tol diet Feels better than yesterday No SOB   Objective    Interim History: none  Telemetry: none   Objective: Filed Vitals:   08/02/13 2100 08/02/13 2130 08/03/13 0026 08/03/13 0604  BP: 121/60 125/53 130/60 96/64  Pulse: 80 82 81 76  Temp:   99.3 F (37.4 C) 98.3 F (36.8 C)  TempSrc:   Oral Oral  Resp: 22 23 23 23   Height:   5\' 1"  (1.549 m)   Weight:   132.2 kg (291 lb 7.2 oz)   SpO2: 96% 97% 100%     Intake/Output Summary (Last 24 hours) at 08/03/13 1111 Last data filed at 08/03/13 3557  Gross per 24 hour  Intake    710 ml  Output    300 ml  Net    410 ml    Exam:  General: eomi, ncat Cardiovascular:  s1 s 2no m/r/g Respiratory: clear, no added sound Abdomen:  Soft, NT/ND Skin no le edema Neuro intact  Data Reviewed: Basic Metabolic Panel:  Recent Labs Lab 08/02/13 1833 08/03/13 0611  NA 139 141  K 3.8 3.6*  CL 98 103  CO2 23 23  GLUCOSE 93 99  BUN 66* 56*  CREATININE 2.23* 1.46*  CALCIUM 9.1 8.8   Liver Function Tests: No results found for this basename: AST, ALT,  ALKPHOS, BILITOT, PROT, ALBUMIN,  in the last 168 hours No results found for this basename: LIPASE, AMYLASE,  in the last 168 hours No results found for this basename: AMMONIA,  in the last 168 hours CBC:  Recent Labs Lab 08/02/13 1833 08/03/13 0611  WBC 10.5 9.5  NEUTROABS 7.9*  --   HGB 12.6 11.8*  HCT 37.4 35.5*  MCV 80.6 81.1  PLT 253 292   Cardiac Enzymes: No results found for this basename: CKTOTAL, CKMB, CKMBINDEX, TROPONINI,  in the last 168 hours BNP: No components found with this basename: POCBNP,  CBG:  Recent Labs Lab 08/03/13 0005 08/03/13 0442 08/03/13 0727  GLUCAP 98 110* 90    No results found for this or any previous visit (from the past 240 hour(s)).   Studies:              All Imaging reviewed and is as per above notation   Scheduled Meds: . aspirin EC  81 mg Oral Daily  . calcium-vitamin D  1 tablet Oral Daily  . ferrous sulfate  325 mg Oral QPC breakfast  . glipiZIDE  2.5 mg Oral BID AC  . HYDROcodone-acetaminophen  1 tablet Oral BID  . insulin aspart  0-15 Units Subcutaneous 6 times per day  .  levothyroxine  150 mcg Oral BH-q7a  . mometasone-formoterol  2 puff Inhalation BID  . pantoprazole  40 mg Oral Daily  . rosuvastatin  40 mg Oral q1800   Continuous Infusions: . sodium chloride 100 mL/hr at 08/03/13 1049     Assessment/Plan: 1. Acute kidney injury-creatinine 2.23-->1.46. Continue IV fluids 100 cc per hour. Repeat renal panel in a.m. 2. Hypertension-blood pressure actually low. Medication discontinued = amlodipine 10, Coreg 6.25, Dyazide every morning, Benicar 40 we will implement as needed-Hesitate to use thiazides in near future or ARB 3. Diabetes mellitus-discontinue oral medications. Place on sliding scale coverage 4 times a day a.c. blood sugar 90-110 states she has low blood sugar is in outpatient setting in the evening so probably need to discontinue glipizide as an outpatient- 4. Hypothyroidism continue Synthroid 150 mcg  daily 5. h/o CAD c PTCI 02/2011 + DES-continue aspirin-her Effient is on hold until 08/08/13 per ENTs input 6. Epistaxis-recent cauterization and posterior nasal packing-as above 7. Hypercholesterolemia continue Crestor 40 daily    Code Status: Full CODE STATUS  Family Communication: Discussed with son on telephone  Disposition Plan: Inpatient   Verneita Griffes, MD  [facility] Pager [phone] 08/03/2013, 11:11 AM    LOS: 1 day

## 2013-08-03 NOTE — Care Management Note (Addendum)
    Page 1 of 1   08/04/2013     8:27:14 AM CARE MANAGEMENT NOTE 08/04/2013  Patient:  [name], [name]   Account Number:  [id number]  Date Initiated:  08/03/2013  Documentation initiated by:  Theophilus Kinds  Subjective/Objective Assessment:   Pt admitted from home with AKI. Pt lives with her 2 sons and will return home at discharge. Pt is independent with ADL's. Pts PCP is [employer].     Action/Plan:   No CM needs noted.   Anticipated DC Date:  08/05/2013   Anticipated DC Plan:  [place]  CM consult      Choice offered to / List presented to:             Status of service:  Completed, signed off Medicare Important Message given?   (If response is "NO", the following Medicare IM given date fields will be blank) Date Medicare IM given:   Medicare IM given by:   Date Additional Medicare IM given:   Additional Medicare IM given by:    Discharge Disposition:  HOME/SELF CARE  Per UR Regulation:    If discussed at Long Length of Stay Meetings, dates discussed:    Comments:  08/04/13 0830 Christinia Gully, RN BSN CM Pt discharged home today. No CM needs noted.  08/03/13 [place], RN BSN CM

## 2013-08-03 NOTE — Progress Notes (Signed)
UR Completed Janelle Culton Graves-Bigelow, RN,BSN [phone]  

## 2013-08-04 LAB — BASIC METABOLIC PANEL
Anion gap: 13 (ref 5–15)
BUN: 33 mg/dL — AB (ref 6–23)
CO2: 25 meq/L (ref 19–32)
Calcium: 8.1 mg/dL — ABNORMAL LOW (ref 8.4–10.5)
Chloride: 106 mEq/L (ref 96–112)
Creatinine, Ser: 0.93 mg/dL (ref 0.50–1.10)
GFR calc Af Amer: 74 mL/min — ABNORMAL LOW (ref >90)
GFR calc non Af Amer: 64 mL/min — ABNORMAL LOW (ref >90)
GLUCOSE: 105 mg/dL — AB (ref 70–99)
POTASSIUM: 3.4 meq/L — AB (ref 3.7–5.3)
Sodium: 144 mEq/L (ref 137–147)

## 2013-08-04 LAB — GLUCOSE, CAPILLARY
GLUCOSE-CAPILLARY: 109 mg/dL — AB (ref 70–99)
GLUCOSE-CAPILLARY: 112 mg/dL — AB (ref 70–99)
GLUCOSE-CAPILLARY: 141 mg/dL — AB (ref 70–99)
Glucose-Capillary: 90 mg/dL (ref 70–99)
Glucose-Capillary: 161 mg/dL — ABNORMAL HIGH (ref 70–99)

## 2013-08-04 NOTE — Plan of Care (Signed)
Problem: Discharge Progression Outcomes Goal: Other Discharge Outcomes/Goals Outcome: Completed/Met Date Met:  08/04/13 BUN is coming down pt encouraged to drink plenty of fluids and stay out of the heat of the day

## 2013-08-04 NOTE — Discharge Summary (Signed)
Physician Discharge Summary  [name] [id number]:[id number] DOB: Jan 05, 1951 DOA: 08/02/2013  PCP: Delphina Cahill, MD  Admit date: 08/02/2013 Discharge date: 08/04/2013  Time spent: 35 minutes  Recommendations for Outpatient Follow-up:  1. Multiple medication changes have been made given her acute kidney injury-discontinued her Dyazide, Benicar 2. Suggest withdrawal of metaglip-A1c has been in the 6 range and may only need one agent and she is having hypoglycemia at night 3.  Recommend repeat basic metabolic panel in about one week-have asked her to schedule a nurse visit for blood work at PCP office on 7/13 or 7/14 4. effient in 2 to be resumed per ENT instructions 7/14 5. Consider bariatric referral as has multiple qualifiable illnesses for referral 6. No home health or other needs have been identified  Discharge Diagnoses:  Principal Problem:   AKI (acute kidney injury) Active Problems:   Type 2 diabetes mellitus   Recurrent pulmonary emboli   Coronary atherosclerosis of native coronary artery   Essential hypertension, benign   COPD (chronic obstructive pulmonary disease)   Hypothyroidism   Morbid obesity   Epistaxis   Acute kidney injury   Discharge Condition: Stable  Diet recommendation: Heart healthy  Filed Weights   08/02/13 1710 08/03/13 0026  Weight: 126.554 kg (279 lb) 132.2 kg (291 lb 7.2 oz)    History of present illness:  63 y/o ? known h/o CAD c PTCI 02/2011 + DES, Morbid obesity, Ty 2 DM, h/o recurrent PE 1980's, Hiatal hernia, hld, Hypothyroidism, prior SBO, H/o Ovarian CA admitted for Feeling lightheaded and found to have low SBP at home. SHe was recently seen in ED 07/28/13 for severe Nasal bleed which was treated with cautery 08/02/13 as removal of the nasal packing worsened this  Found to have AKI Creat 0.9-->2.23 and admitted See below for details  Hospital Course:   1. Acute kidney injury-creatinine 2.23-->1.46-->0.93.  Received IV fluids 100 cc per hour  until date of discharge will need repeat basic metabolic panel probably in about one week-etiology probably secondary to ARB, combination thiazide diuretic which have been discontinued--Hesitate to use thiazides in near future or ARB but this can cautiously be reimplemented in the outpatient setting 2. Hypertension-blood pressure actually low. Medication continued = amlodipine 10, Coreg 6.25 3 times a day-monitor as an outpatient see above discussion 3. Diabetes mellitus-discontinued oral medications on admission blood sugar 90-110 states she has low blood sugar is in outpatient setting on discharge Metaglip was discontinued 4. Hypothyroidism continue Synthroid 150 mcg daily 5. h/o CAD c PTCI 02/2011 + DES-continue aspirin-her Effient is on hold until 08/08/13 per ENTs input 6. History recurrent pulmonary emboli -supposed to be on into . See above discussion . May continue aspirin 81 mg  7. Epistaxis-recent cauterization and posterior nasal packing-as above 8. Hypercholesterolemia continue Crestor 40 daily  9. Borderline microcytic anemia-hemoglobin and 11-12 range. Age appropriate screening as an outpatient 10. Morbid obesity, Body mass index is 55.1 kg/(m^2).-recommend outpatient counseling for weight loss. May benefit from bariatric referral  Discharge Exam: Filed Vitals:   08/04/13 0642  BP: 123/49  Pulse: 65  Temp: 97.3 F (36.3 C)  Resp: 22    General: Pleasant oriented not in distress  Cardiovascular: s1 s 2no m/r/g Respiratory: clear no added sound  Discharge Instructions You were cared for by a hospitalist during your hospital stay. If you have any questions about your discharge medications or the care you received while you were in the hospital after you are discharged, you can call  the unit and asked to speak with the hospitalist on call if the hospitalist that took care of you is not available. Once you are discharged, your primary care physician will handle any further medical  issues. Please note that NO REFILLS for any discharge medications will be authorized once you are discharged, as it is imperative that you return to your primary care physician (or establish a relationship with a primary care physician if you do not have one) for your aftercare needs so that they can reassess your need for medications and monitor your lab values.  Discharge Instructions   Diet - low sodium heart healthy    Complete by:  As directed      Discharge instructions    Complete by:  As directed   Do not TAKE METFORMIN/GLIPIZIDE COMBINATION until you follow-up with ur regular MD STOP BENICAR and DYAZIDE-these might have contributed to your kidney issues but these may be re-started at a lower dose with some supervision Resume Effient when instructed by ENT which is on 7/14 You may resume your Aspirin for now     Increase activity slowly    Complete by:  As directed             Medication List    STOP taking these medications       aspirin EC 81 MG tablet     glipiZIDE-metformin 2.5-500 MG per tablet  Commonly known as:  METAGLIP     nitroGLYCERIN 0.4 MG SL tablet  Commonly known as:  NITROSTAT     olmesartan 40 MG tablet  Commonly known as:  BENICAR     triamterene-hydrochlorothiazide 37.5-25 MG per capsule  Commonly known as:  DYAZIDE      TAKE these medications       ACCU-CHEK FASTCLIX LANCETS Misc     ACCU-CHEK SMARTVIEW test strip  Generic drug:  glucose blood     albuterol (2.5 MG/3ML) 0.083% nebulizer solution  Commonly known as:  PROVENTIL  Take 2.5 mg by nebulization every 6 (six) hours as needed. For wheezing. Combines with atrovent     ALPRAZolam 1 MG tablet  Commonly known as:  XANAX  Take 1 mg by mouth at bedtime as needed for anxiety or sleep.     amLODipine 10 MG tablet  Commonly known as:  NORVASC  Take 10 mg by mouth daily.     calcium-vitamin D 500-200 MG-UNIT per tablet  Commonly known as:  OSCAL WITH D  Take 1 tablet by mouth daily.      carvedilol 6.25 MG tablet  Commonly known as:  COREG  Take 1 tablet (6.25 mg total) by mouth 2 (two) times daily with a meal.     EFFIENT 10 MG Tabs tablet  Generic drug:  prasugrel  TAKE 1 TABLET (10 MG TOTAL) BY MOUTH DAILY.     Fluticasone-Salmeterol 250-50 MCG/DOSE Aepb  Commonly known as:  ADVAIR  Inhale 1 puff into the lungs 2 (two) times daily.     HYDROcodone-acetaminophen 5-325 MG per tablet  Commonly known as:  NORCO/VICODIN  Take 1 tablet by mouth 2 (two) times daily. For pain     ipratropium 0.02 % nebulizer solution  Commonly known as:  ATROVENT  Take 500 mcg by nebulization every 6 (six) hours as needed. For shortness of breath. Combines it with Albuterol     Iron 325 (65 FE) MG Tabs  Take 1 tablet by mouth daily after breakfast.     levothyroxine 150 MCG tablet  Commonly known as:  SYNTHROID, LEVOTHROID  Take 1 tablet (150 mcg total) by mouth every morning.     loratadine 10 MG tablet  Commonly known as:  CLARITIN  Take 10 mg by mouth every morning.     meclizine 25 MG tablet  Commonly known as:  ANTIVERT  Take 25 mg by mouth 2 (two) times daily.     ONGLYZA 5 MG Tabs tablet  Generic drug:  saxagliptin HCl  Take 5 mg by mouth every morning.     pantoprazole 40 MG tablet  Commonly known as:  PROTONIX  TAKE 2 TABLETS BY MOUTH TWICE A DAY BEFORE MEALS     prenatal multivitamin Tabs tablet  Take 1 tablet by mouth daily.     rosuvastatin 20 MG tablet  Commonly known as:  CRESTOR  Take 2 tablets (40 mg total) by mouth daily.       Allergies  Allergen Reactions  . Codeine Shortness Of Breath and Itching  . Lipitor [Atorvastatin]   . Penicillins Rash  . Sulfa Antibiotics Rash       Follow-up Information   Follow up with Delphina Cahill, MD.   Specialty:  Internal Medicine   Contact information:    [place] [address] [phone]        The results of significant diagnostics from this hospitalization (including imaging,  microbiology, ancillary and laboratory) are listed below for reference.    Significant Diagnostic Studies: No results found.  Microbiology: No results found for this or any previous visit (from the past 240 hour(s)).   Labs: Basic Metabolic Panel:  Recent Labs Lab 08/02/13 1833 08/03/13 0611 08/04/13 0525  NA 139 141 144  K 3.8 3.6* 3.4*  CL 98 103 106  CO2 23 23 25   GLUCOSE 93 99 105*  BUN 66* 56* 33*  CREATININE 2.23* 1.46* 0.93  CALCIUM 9.1 8.8 8.1*   Liver Function Tests: No results found for this basename: AST, ALT, ALKPHOS, BILITOT, PROT, ALBUMIN,  in the last 168 hours No results found for this basename: LIPASE, AMYLASE,  in the last 168 hours No results found for this basename: AMMONIA,  in the last 168 hours CBC:  Recent Labs Lab 08/02/13 1833 08/03/13 0611  WBC 10.5 9.5  NEUTROABS 7.9*  --   HGB 12.6 11.8*  HCT 37.4 35.5*  MCV 80.6 81.1  PLT 253 292   Cardiac Enzymes: No results found for this basename: CKTOTAL, CKMB, CKMBINDEX, TROPONINI,  in the last 168 hours BNP: BNP (last 3 results)  Recent Labs  01/31/13 1705  PROBNP 440.0*   CBG:  Recent Labs Lab 08/03/13 1708 08/03/13 2127 08/04/13 0036 08/04/13 0402 08/04/13 0744  GLUCAP 121* 112* 141* 90 109*       Signed:  Nita Sells  [facility] 08/04/2013, 8:17 AM

## 2013-09-14 ENCOUNTER — Other Ambulatory Visit: Payer: Self-pay | Admitting: Cardiology

## 2013-09-17 ENCOUNTER — Other Ambulatory Visit: Payer: Self-pay | Admitting: Cardiology

## 2013-09-19 ENCOUNTER — Ambulatory Visit: Payer: 59 | Admitting: Cardiology

## 2013-09-20 ENCOUNTER — Other Ambulatory Visit: Payer: Self-pay | Admitting: Cardiology

## 2013-10-10 ENCOUNTER — Ambulatory Visit (INDEPENDENT_AMBULATORY_CARE_PROVIDER_SITE_OTHER): Payer: 59 | Admitting: Cardiology

## 2013-10-10 ENCOUNTER — Encounter: Payer: Self-pay | Admitting: Cardiology

## 2013-10-10 VITALS — BP 122/72 | HR 72 | Ht 61.0 in | Wt 272.0 lb

## 2013-10-10 DIAGNOSIS — I10 Essential (primary) hypertension: Secondary | ICD-10-CM

## 2013-10-10 DIAGNOSIS — R040 Epistaxis: Secondary | ICD-10-CM

## 2013-10-10 MED ORDER — OLMESARTAN MEDOXOMIL 40 MG PO TABS
40.0000 mg | ORAL_TABLET | Freq: Every day | ORAL | Status: DC
Start: 2013-10-10 — End: 2014-04-17

## 2013-10-10 MED ORDER — CLOPIDOGREL BISULFATE 75 MG PO TABS
75.0000 mg | ORAL_TABLET | Freq: Every day | ORAL | Status: DC
Start: 2013-10-10 — End: 2014-10-06

## 2013-10-10 NOTE — Assessment & Plan Note (Signed)
Symptomatically stable with multivessel disease status post DES left main intervention with circumflex disease that is not easily revascularized as of 2013. Followup Cardiolite within the year consistent with lateral ischemia. She is stable on medical therapy. I will change from Effient to Plavix at this time, prefer to keep her on antiplatelet agent long-term with DES left main stent.

## 2013-10-10 NOTE — Progress Notes (Signed)
Clinical Summary [name] is a medically complex 63 y.o.female last seen in April. Interval records reviewed, patient was admitted to the hospital in July with hypotension and findings of acute renal insufficiency. She was hydrated with significant improvement in renal function, also taken off thiazide and ARB. Since then she reports follow with Dr. [name], continued normalization of renal function, and she is now back on Benicar.  Lab work from July 10 showed potassium 3.4, BUN 33, creatinine 0.9.  She reports no angina symptoms, stable shortness of breath. Today we reviewed her medications and discuss changing from Effient to Plavix.  Lexiscan Cardiolite in January of this year showed evidence of scar in the apical and anteroseptal distribution without ischemia. There was a moderate-sized ischemic defect in the lateral wall consistent with the patient's circumflex disease that had not been able to be successfully intervened upon as of 2013 - actually described as being occluded at the end of her left main intervention in February 2013. LVEF 73%. It was felt that medical therapy was the most appropriate option.   Allergies  Allergen Reactions  . Codeine Shortness Of Breath and Itching  . Lipitor [Atorvastatin]   . Penicillins Rash  . Sulfa Antibiotics Rash    Current Outpatient Prescriptions  Medication Sig Dispense Refill  . albuterol (PROVENTIL) (2.5 MG/3ML) 0.083% nebulizer solution Take 2.5 mg by nebulization every 6 (six) hours as needed. For wheezing. Combines with atrovent      . ALPRAZolam (XANAX) 1 MG tablet Take 1 mg by mouth at bedtime as needed for anxiety or sleep.       [name] amLODipine (NORVASC) 10 MG tablet Take 10 mg by mouth daily.      . calcium-vitamin D (OSCAL WITH D) 500-200 MG-UNIT per tablet Take 1 tablet by mouth daily.       . carvedilol (COREG) 6.25 MG tablet TAKE 1 TABLET (6.25 MG TOTAL) BY MOUTH 2 (TWO) TIMES DAILY WITH A MEAL.  60 tablet  6  . CRESTOR 20  MG tablet TAKE 2 TABLETS (40 MG TOTAL) BY MOUTH DAILY.  60 tablet  0  . Ferrous Sulfate (IRON) 325 (65 FE) MG TABS Take 1 tablet by mouth daily after breakfast.       . Fluticasone-Salmeterol (ADVAIR) 250-50 MCG/DOSE AEPB Inhale 1 puff into the lungs 2 (two) times daily.  60 each  0  . glipiZIDE (GLUCOTROL XL) 5 MG 24 hr tablet Take 5 mg by mouth 2 (two) times daily.      [name] glucose blood test strip 1 each by Other route as needed for other. Use as instructed one touch ultra      . HYDROcodone-acetaminophen (NORCO) 5-325 MG per tablet Take 1 tablet by mouth 2 (two) times daily. For pain      . ipratropium (ATROVENT) 0.02 % nebulizer solution Take 500 mcg by nebulization every 6 (six) hours as needed. For shortness of breath. Combines it with Albuterol      . levothyroxine (SYNTHROID, LEVOTHROID) 150 MCG tablet Take 1 tablet (150 mcg total) by mouth every morning.  30 tablet  1  . loratadine (CLARITIN) 10 MG tablet Take 10 mg by mouth every morning.       . meclizine (ANTIVERT) 25 MG tablet Take 25 mg by mouth 2 (two) times daily.       . pantoprazole (PROTONIX) 40 MG tablet TAKE 2 TABLETS BY MOUTH TWICE A DAY BEFORE MEALS  120 tablet  3  . Prenatal Vit-Fe  Fumarate-FA (PRENATAL MULTIVITAMIN) TABS Take 1 tablet by mouth daily.        . saxagliptin HCl (ONGLYZA) 5 MG TABS tablet Take 5 mg by mouth every morning.       . clopidogrel (PLAVIX) 75 MG tablet Take 1 tablet (75 mg total) by mouth daily.  90 tablet  3  . [DISCONTINUED] esomeprazole (NEXIUM) 40 MG capsule Take 40 mg by mouth daily before breakfast.        No current facility-administered medications for this visit.    Past Medical History  Diagnosis Date  . Type 2 diabetes mellitus   . Essential hypertension, benign   . Pulmonary embolism     Recurrent, previously on Coumadin  . Hiatal hernia   . Ovarian cancer   . COPD (chronic obstructive pulmonary disease)   . NSTEMI (non-ST elevated myocardial infarction)   . Coronary  atherosclerosis of native coronary artery     Multivessel, DES distal left main, unsuccessful PCI circumflex 2/13, LVEF 50-55%  . Hypothyroidism   . Mixed hyperlipidemia   . Overweight(278.02)   . Ejection fraction     EF 50-55%, echo, March 15, 2011, question of mild posterior hypokinesis, technically difficult study despite the use of contrast    Social History [name] reports that she quit smoking about 33 years ago. Her smoking use included Cigarettes. She smoked 0.00 packs per day. She has never used smokeless tobacco. [name] reports that she does not drink alcohol.  Review of Systems No palpitations or syncope. No orthopnea or PND. Stable appetite. Constipation. Other systems reviewed and negative.  Physical Examination Filed Vitals:   10/10/13 1523  BP: 122/72  Pulse: 72   Filed Weights   10/10/13 1523  Weight: 272 lb (123.378 kg)    Morbidly obese, comfortable at rest.  HEENT: Conjunctiva and lids normal, oropharynx clear.  Neck: Supple ,increased girth, no carotid bruits, no thyromegaly.  Lungs: Clear to auscultation, nonlabored breathing at rest.  Cardiac: Regular rate and rhythm, no S3, soft systolic murmur, no pericardial rub.  Abdomen: Protuberent, nontender, bowel sounds present, no guarding or rebound.  Extremities: No pitting edema, distal pulses 2+.    Problem List and Plan   Coronary atherosclerosis of native coronary artery Symptomatically stable with multivessel disease status post DES left main intervention with circumflex disease that is not easily revascularized as of 2013. Followup Cardiolite within the year consistent with lateral ischemia. She is stable on medical therapy. I will change from Effient to Plavix at this time, prefer to keep her on antiplatelet agent long-term with DES left main stent.  Essential hypertension, benign Blood pressure is normal today. Keep followup with Dr. [name].  Epistaxis Status post severe episode as  outlined, presently resolved. Changing from Effient to Plavix.    Satira Sark, M.D., F.A.C.C.

## 2013-10-10 NOTE — Assessment & Plan Note (Signed)
Status post severe episode as outlined, presently resolved. Changing from Effient to Plavix.

## 2013-10-10 NOTE — Assessment & Plan Note (Signed)
Blood pressure is normal today. Keep followup with Dr. [name].

## 2013-10-10 NOTE — Patient Instructions (Addendum)
Your physician wants you to follow-up in: 6 months You will receive a reminder letter in the mail two months in advance. If you don't receive a letter, please call our office to schedule the follow-up appointment.    After you finish your last dose of Effient STOP, then START Plavix 75 mg daily  START Benicar 40 mg daily       Thank you for choosing [facility] !

## 2013-10-12 ENCOUNTER — Other Ambulatory Visit (HOSPITAL_COMMUNITY): Payer: Self-pay | Admitting: Cardiology

## 2013-10-14 ENCOUNTER — Other Ambulatory Visit: Payer: Self-pay | Admitting: Cardiology

## 2013-11-16 ENCOUNTER — Telehealth: Payer: Self-pay | Admitting: Cardiology

## 2013-11-16 ENCOUNTER — Other Ambulatory Visit: Payer: Self-pay

## 2013-11-16 DIAGNOSIS — E785 Hyperlipidemia, unspecified: Secondary | ICD-10-CM

## 2013-11-16 MED ORDER — ATORVASTATIN CALCIUM 80 MG PO TABS: 80.0000 mg | ORAL_TABLET | Freq: Every day | ORAL | Status: DC

## 2013-11-16 NOTE — Telephone Encounter (Signed)
Pt states she took lipitor for a long time before she developed leg pain from it.She is willing to try it again as she cannot afford the crestor.She knows to call us if she develops any problems back on lipitor.I have mailed he lab slips for lft's and lipid check in 3 months

## 2013-11-16 NOTE — Telephone Encounter (Signed)
Patient states that she has Medicare now and Crestor copay will be $200.  Wants to know if there is anything something else she can take. [name]

## 2013-11-16 NOTE — Telephone Encounter (Signed)
Atorvastatin 40 mg will cost pt $2./month

## 2013-11-16 NOTE — Telephone Encounter (Signed)
I am not sure that simvastatin would be the best choice. She has been on high-dose Crestor, achieving an LDL in the 90s. We would have to go with high-dose simvastatin, and this would be relatively contraindicated since she is on Norvasc. You can check to see what her cost for atorvastatin would be.

## 2013-11-16 NOTE — Telephone Encounter (Signed)
If she would like to switch, would recommend changing from Crestor 40 mg daily to atorvastatin 80 mg daily. If she tolerates this, should have followup FLP and LFT in 3 months.

## 2013-11-16 NOTE — Telephone Encounter (Signed)
Per pharmacist at [facility], pt can get Zocor cheaply,pt states she needs a generic drug as her other meds cost her under $6 per rx monthly.Please advise

## 2013-11-22 IMAGING — CR DG CHEST 1V PORT
1 series · 1 of 1 positions shown · non-contrast
Comparison: 08/09/2010 chest CT.  08/08/2010 chest x-ray.

CLINICAL DATA: Mid chest pain. Shortness of breath..  High blood
pressure.  Prior smoker.  History of blood clots.

PORTABLE CHEST - 1 VIEW

[view not recorded]
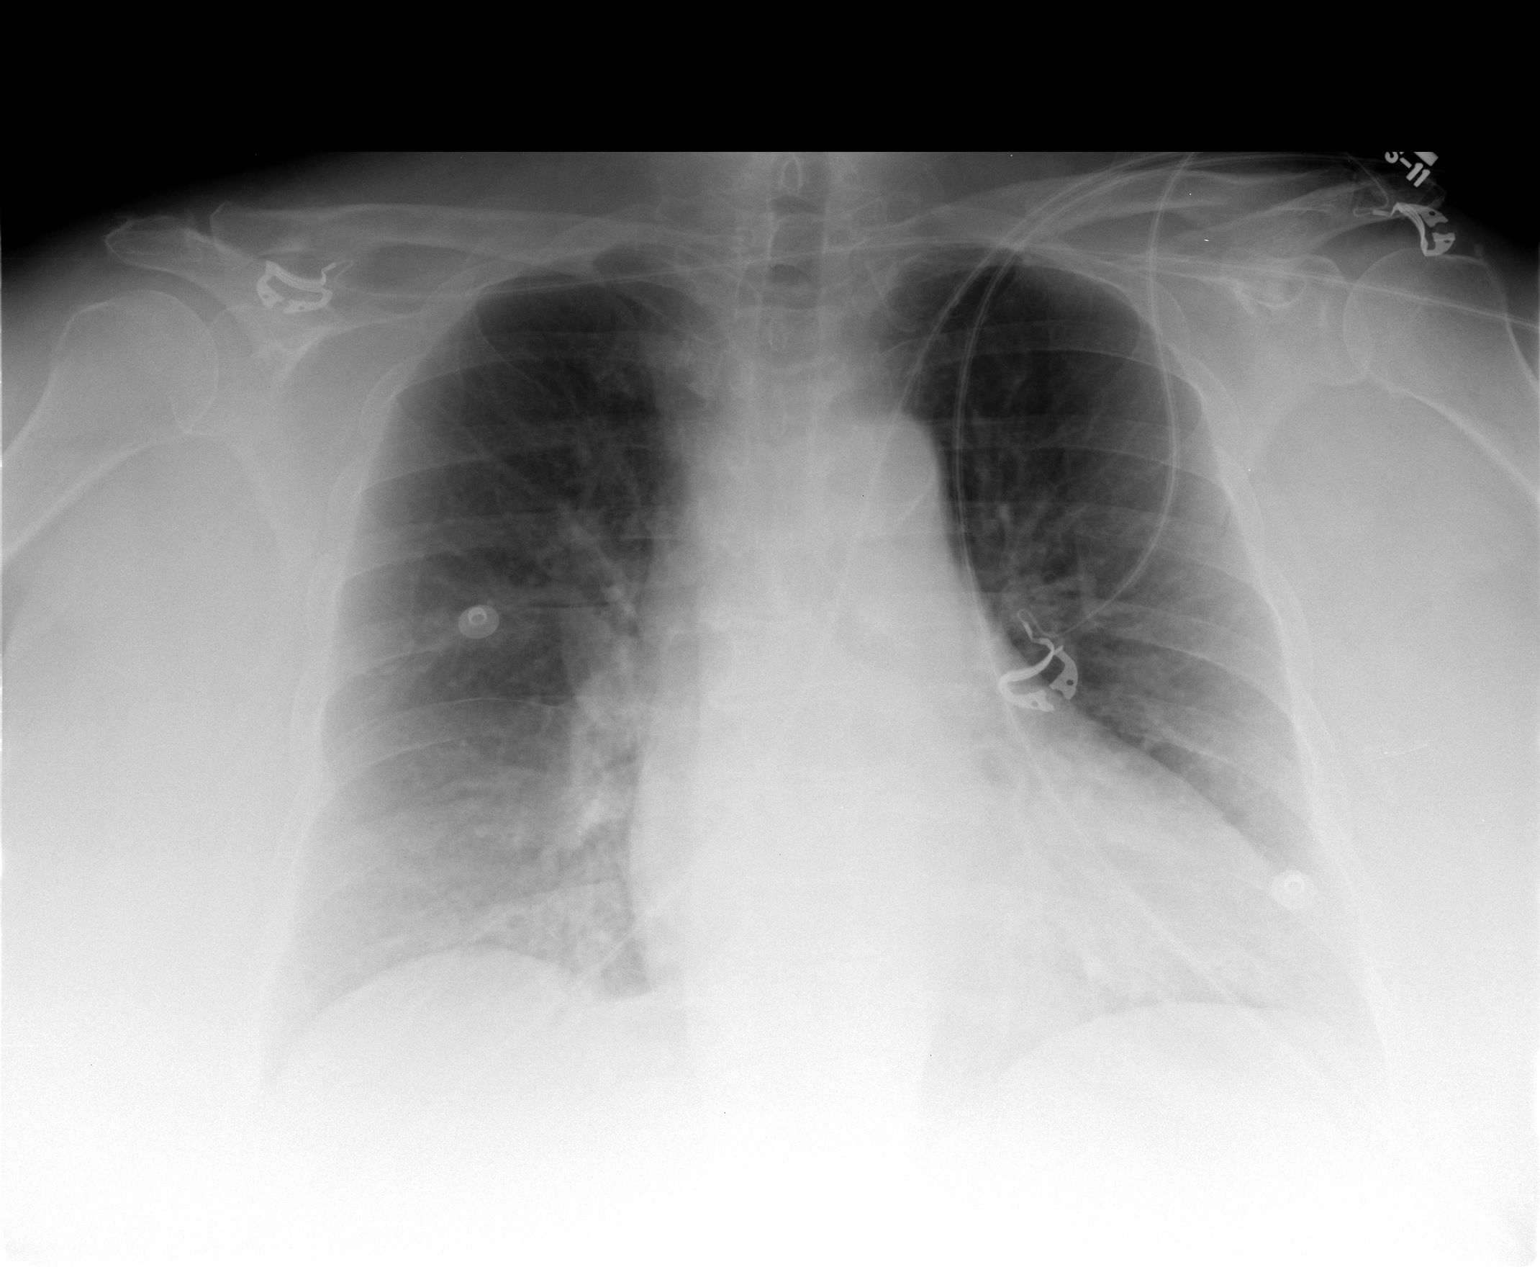

[1 of 1 positions shown; findings below may reference images not displayed]

FINDINGS: Mild cardiomegaly.  Central pulmonary vascular
prominence.  No segmental infiltrate or gross pneumothorax.
Calcified mildly tortuous aorta.
IMPRESSION: [Mild cardiomegaly.

Central pulmonary vascular prominence.

No segmental infiltrate or gross pneumothorax.

Calcified mildly tortuous aorta.

## 2013-11-24 IMAGING — CR DG CHEST 2V
1 series · 1 of 1 positions shown · non-contrast
Comparison: 03/12/2011 CT and plain film.

CLINICAL DATA: Preop for CABG.  Hypertension and diabetes.  Ex-
smoker.  COPD.

CHEST - 2 VIEW

[w chest lat *]
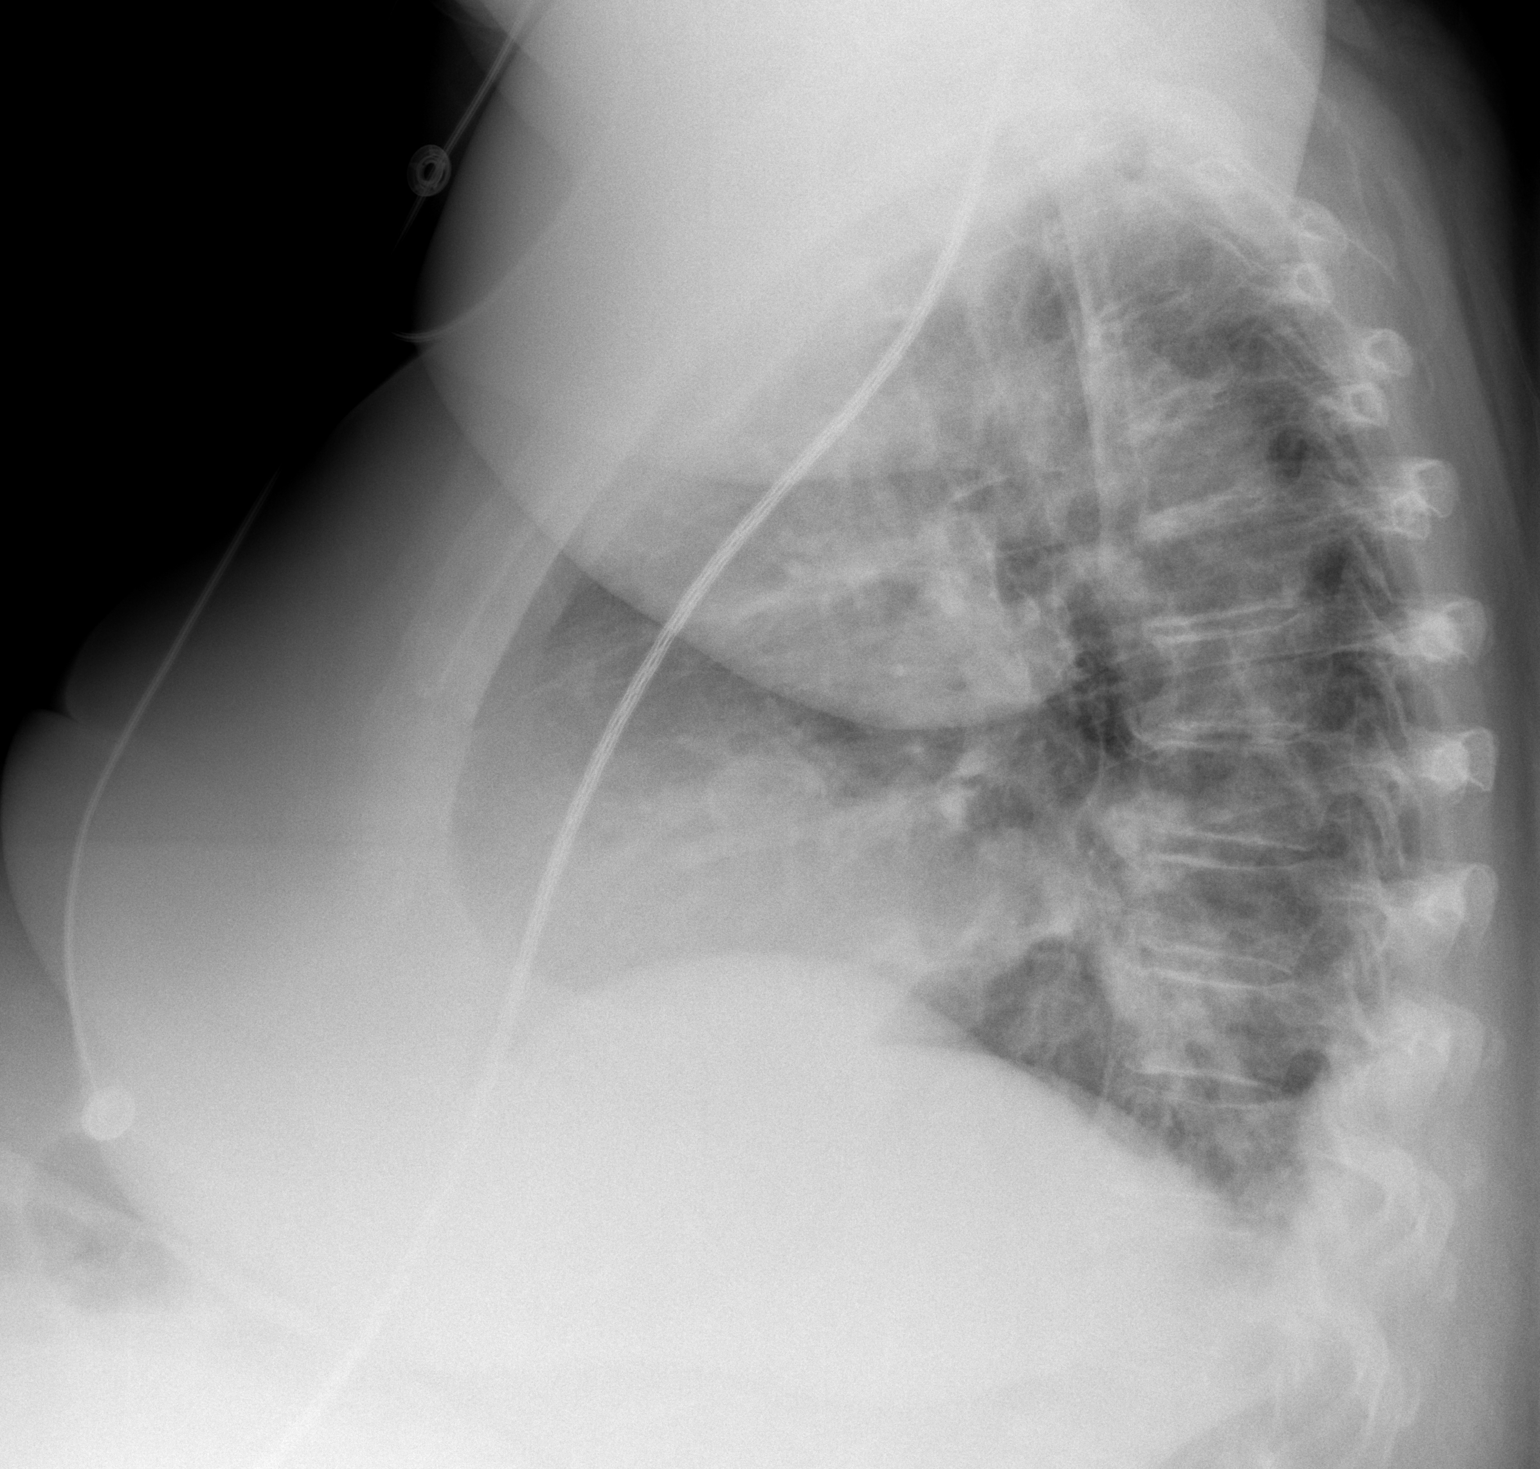

[1 of 1 positions shown; findings below may reference images not displayed]

FINDINGS: Both views degraded by patient body habitus.  The lateral
is also degraded by patient arm position. Midline trachea.
Cardiomegaly accentuated by AP portable technique.  No pleural
effusion or pneumothorax.  Low lung volumes with resultant
pulmonary interstitial prominence.
IMPRESSION: Degraded exam, with cardiomegaly and low lung volumes, but no acute
disease.

## 2013-12-13 ENCOUNTER — Telehealth: Payer: Self-pay | Admitting: *Deleted

## 2013-12-13 NOTE — Telephone Encounter (Signed)
Received labs in Dr. McDowell folder. 

## 2013-12-14 ENCOUNTER — Encounter: Payer: Self-pay | Admitting: Cardiology

## 2014-01-02 ENCOUNTER — Other Ambulatory Visit: Payer: Self-pay | Admitting: Cardiology

## 2014-01-04 ENCOUNTER — Encounter (HOSPITAL_COMMUNITY): Payer: Self-pay | Admitting: Cardiovascular Disease

## 2014-02-23 DIAGNOSIS — E785 Hyperlipidemia, unspecified: Secondary | ICD-10-CM | POA: Diagnosis not present

## 2014-02-24 LAB — LIPID PANEL
CHOLESTEROL: 185 mg/dL (ref 0–200)
HDL: 56 mg/dL (ref >39)
LDL CALC: 110 mg/dL — AB (ref 0–99)
Total CHOL/HDL Ratio: 3.3 Ratio
Triglycerides: 93 mg/dL (ref <150)
VLDL: 19 mg/dL (ref 0–40)

## 2014-02-24 LAB — HEPATIC FUNCTION PANEL
ALT: 20 U/L (ref 0–35)
AST: 17 U/L (ref 0–37)
Albumin: 4.4 g/dL (ref 3.5–5.2)
Alkaline Phosphatase: 63 U/L (ref 39–117)
Bilirubin, Direct: 0.1 mg/dL (ref 0.0–0.3)
Indirect Bilirubin: 0.6 mg/dL (ref 0.2–1.2)
TOTAL PROTEIN: 7.4 g/dL (ref 6.0–8.3)
Total Bilirubin: 0.7 mg/dL (ref 0.2–1.2)

## 2014-03-12 ENCOUNTER — Telehealth: Payer: Self-pay

## 2014-03-12 NOTE — Telephone Encounter (Signed)
Pt's insurance no longer covering Pantoprazole. Can you order something else ?

## 2014-03-14 NOTE — Telephone Encounter (Signed)
Not sure that I specifically put her on this medication (for GERD). Would find out which alternatives are covered by her plan, and check with her primary care provider Dr. [name].

## 2014-03-15 ENCOUNTER — Telehealth: Payer: Self-pay | Admitting: *Deleted

## 2014-03-15 NOTE — Telephone Encounter (Signed)
Pt needs prior auth done with insurance to get 4 pills a day of protonix. Pt son dropped of form the other day for [place] to send in for this/tmj

## 2014-03-15 NOTE — Telephone Encounter (Signed)
I spoke with [name] at Dr [facility] office, they will handle this medication for this patient,pt made aware

## 2014-03-15 NOTE — Telephone Encounter (Signed)
Encouraged pt to speak with pcp to find new PPI,pt states she will call Dr [name]

## 2014-04-06 ENCOUNTER — Other Ambulatory Visit: Payer: Self-pay | Admitting: Cardiology

## 2014-04-17 ENCOUNTER — Encounter: Payer: Self-pay | Admitting: Cardiology

## 2014-04-17 ENCOUNTER — Ambulatory Visit (INDEPENDENT_AMBULATORY_CARE_PROVIDER_SITE_OTHER): Payer: Medicare Other | Admitting: Cardiology

## 2014-04-17 VITALS — BP 116/72 | HR 80 | Ht 61.0 in | Wt 269.0 lb

## 2014-04-17 DIAGNOSIS — E782 Mixed hyperlipidemia: Secondary | ICD-10-CM

## 2014-04-17 DIAGNOSIS — I2510 Atherosclerotic heart disease of native coronary artery without angina pectoris: Secondary | ICD-10-CM

## 2014-04-17 DIAGNOSIS — I10 Essential (primary) hypertension: Secondary | ICD-10-CM | POA: Diagnosis not present

## 2014-04-17 NOTE — Patient Instructions (Signed)
Your physician wants you to follow-up in: 6 months with Dr.McDowell You will receive a reminder letter in the mail two months in advance. If you don't receive a letter, please call our office to schedule the follow-up appointment.     Your physician recommends that you continue on your current medications as directed. Please refer to the Current Medication list given to you today.     Thank you for choosing [facility] !        

## 2014-04-17 NOTE — Progress Notes (Signed)
Cardiology Office Note  Date: 04/17/2014   ID: [name], DOB 1950-09-26, MRN [id number]  PCP: Delphina Cahill, MD  Primary Cardiologist: [name], MD   Chief Complaint  Patient presents with  . Coronary Artery Disease  . Hypertension  . Hyperlipidemia    History of Present Illness: [name] is a medically complex 64 y.o. female last seen in September 2015. She presents for a follow-up visit today. Fortunately, she has been stable from a cardiac perspective without significant angina symptoms on medical therapy. We reviewed her medications, she has had some adjustments by Dr. [name] which are noted below. Blood pressure today is very well controlled. I also reviewed her lipid panel done back in January. She reports compliance with Lipitor.  Lexiscan Cardiolite in January 2015 showed evidence of scar in the apical and anteroseptal distribution without ischemia. There was a moderate-sized ischemic defect in the lateral wall consistent with the patient's circumflex disease that had not been able to be successfully intervened upon as of 2013 - actually described as being occluded at the end of her left main intervention in February 2013. LVEF 73%.   Past Medical History  Diagnosis Date  . Type 2 diabetes mellitus   . Essential hypertension, benign   . Pulmonary embolism     Recurrent, previously on Coumadin  . Hiatal hernia   . Ovarian cancer   . COPD (chronic obstructive pulmonary disease)   . NSTEMI (non-ST elevated myocardial infarction)   . Coronary atherosclerosis of native coronary artery     Multivessel, DES distal left main, unsuccessful PCI circumflex 2/13, LVEF 50-55%  . Hypothyroidism   . Mixed hyperlipidemia   . Overweight(278.02)   . Ejection fraction     EF 50-55%, echo, March 15, 2011, question of mild posterior hypokinesis, technically difficult study despite the use of contrast    Past Surgical History  Procedure Laterality Date  .  Abdominal surgery    . Cholecystectomy    . Appendectomy    . Cesarean section    . Tonsillectomy    . Breast lumpectomy    . Small intestine surgery  12/2007    Dr [name]  . Closure of abdominal wound/wound vac    . Cataract extraction w/phaco  01/29/2011    Procedure: CATARACT EXTRACTION PHACO AND INTRAOCULAR LENS PLACEMENT (IOC);  Surgeon: [name];  Location: [facility];  Service: Ophthalmology;  Laterality: Left;  CDE: 1.81  . Left heart catheterization with coronary angiogram N/A 03/13/2011    Procedure: LEFT HEART CATHETERIZATION WITH CORONARY ANGIOGRAM;  Surgeon: [name], MD;  Location: [facility] CATH LAB;  Service: Cardiovascular;  Laterality: N/A;  . Percutaneous coronary stent intervention (pci-s) N/A 03/18/2011    Procedure: PERCUTANEOUS CORONARY STENT INTERVENTION (PCI-S);  Surgeon: [name], MD;  Location: [facility] CATH LAB;  Service: Cardiovascular;  Laterality: N/A;    Current Outpatient Prescriptions  Medication Sig Dispense Refill  . albuterol (PROVENTIL) (2.5 MG/3ML) 0.083% nebulizer solution Take 2.5 mg by nebulization every 6 (six) hours as needed. For wheezing. Combines with atrovent    . ALPRAZolam (XANAX) 1 MG tablet Take 1 mg by mouth at bedtime as needed for anxiety or sleep.     [name] amLODipine (NORVASC) 10 MG tablet Take 10 mg by mouth daily.    [name] atorvastatin (LIPITOR) 80 MG tablet Take 1 tablet (80 mg total) by mouth daily. 90 tablet 3  . calcium-vitamin D (OSCAL WITH D) 500-200 MG-UNIT per tablet Take 1 tablet by  mouth daily.     . carvedilol (COREG) 6.25 MG tablet TAKE 1 TABLET (6.25 MG TOTAL) BY MOUTH 2 (TWO) TIMES DAILY WITH A MEAL. 60 tablet 6  . clopidogrel (PLAVIX) 75 MG tablet Take 1 tablet (75 mg total) by mouth daily. 90 tablet 3  . Ferrous Sulfate (IRON) 325 (65 FE) MG TABS Take 1 tablet by mouth daily after breakfast.     . Fluticasone-Salmeterol (ADVAIR) 250-50 MCG/DOSE AEPB Inhale 1 puff into the lungs 2 (two) times daily. 60 each 0  . glipiZIDE  (GLUCOTROL XL) 5 MG 24 hr tablet Take 5 mg by mouth 2 (two) times daily.    [name] glucose blood test strip 1 each by Other route as needed for other. Use as instructed one touch ultra    . ipratropium (ATROVENT) 0.02 % nebulizer solution Take 500 mcg by nebulization every 6 (six) hours as needed. For shortness of breath. Combines it with Albuterol    . levothyroxine (SYNTHROID, LEVOTHROID) 150 MCG tablet Take 1 tablet (150 mcg total) by mouth every morning. 30 tablet 1  . loratadine (CLARITIN) 10 MG tablet Take 10 mg by mouth every morning.     . meclizine (ANTIVERT) 25 MG tablet Take 25 mg by mouth 2 (two) times daily.     [name] NITROSTAT 0.4 MG SL tablet PLACE 1 TAB UNDER TONGUE EVERY 5 MIN AS NEEDED FOR CHEST PAIN (UP TO 3 DOSES) 25 tablet 1  . pantoprazole (PROTONIX) 40 MG tablet TAKE 2 TABLETS BY MOUTH TWICE A DAY BEFORE MEALS 120 tablet 3  . saxagliptin HCl (ONGLYZA) 5 MG TABS tablet Take 5 mg by mouth every morning.     [name] telmisartan (MICARDIS) 80 MG tablet Take 80 mg by mouth daily.  3  . traMADol (ULTRAM) 50 MG tablet Take 50 mg by mouth daily.   3  . [DISCONTINUED] esomeprazole (NEXIUM) 40 MG capsule Take 40 mg by mouth daily before breakfast.      No current facility-administered medications for this visit.    Allergies:  Codeine; Lipitor; Penicillins; and Sulfa antibiotics   Social History: The patient  reports that she quit smoking about 34 years ago. Her smoking use included Cigarettes. She has never used smokeless tobacco. She reports that she does not drink alcohol or use illicit drugs.   ROS:  Please see the history of present illness. Otherwise, complete review of systems is positive for right-sided cataract.  All other systems are reviewed and negative.    Physical Exam: VS:  BP 116/72 mmHg  Pulse 80  Ht 5\' 1"  (1.549 m)  Wt 269 lb (122.018 kg)  BMI 50.85 kg/m2  SpO2 99%, BMI Body mass index is 50.85 kg/(m^2).  Wt Readings from Last 3 Encounters:  04/17/14 269 lb (122.018  kg)  10/10/13 272 lb (123.378 kg)  08/03/13 291 lb 7.2 oz (132.2 kg)     Morbidly obese, comfortable at rest.  HEENT: Conjunctiva and lids normal, oropharynx clear.  Neck: Supple ,increased girth, no carotid bruits, no thyromegaly.  Lungs: Clear to auscultation, nonlabored breathing at rest.  Cardiac: Regular rate and rhythm, no S3, soft systolic murmur, no pericardial rub.  Abdomen: Protuberent, nontender, bowel sounds present, no guarding or rebound.  Extremities: No pitting edema, distal pulses 2+.    ECG: ECG is not ordered today.  Recent Labwork: 08/02/2013: TSH 1.580 08/03/2013: Hemoglobin 11.8*; Platelets 292 08/04/2013: BUN 33*; Creatinine 0.93; Potassium 3.4*; Sodium 144 02/23/2014: ALT 20; AST 17     Component  Value Date/Time   CHOL 185 02/23/2014 1142   TRIG 93 02/23/2014 1142   HDL 56 02/23/2014 1142   CHOLHDL 3.3 02/23/2014 1142   VLDL 19 02/23/2014 1142   LDLCALC 110* 02/23/2014 1142    Other Studies Reviewed Today:  Echocardiogram 03/15/2011: Study Conclusions  - Left ventricle: Possible mild posterior hypokinesis. However, even with contrast injection posterior wall difficult to visualize. No other obvious segmental wall motion abnormalities. The cavity size was normal. Wall thickness was normal. Systolic function was normal. The estimated ejection fraction was in the range of 50% to 55%. Left ventricular diastolic function parameters were normal. - Aortic valve: Valve area: 1.88cm^2(VTI). Valve area: 1.88cm^2 (Vmax).   ASSESSMENT AND PLAN:  1. Symptomatically stable with CAD status post left main DES intervention and unsuccessful circumflex intervention with residual lateral ischemia by follow-up Cardiolite, being managed medically. She continues on dual antiplatelet therapy.  2. Essential hypertension, blood pressure is normal today.  3. Hyperlipidemia, on Lipitor.  Current medicines are reviewed with the patient today.     Disposition: FU with me in 6 months.   Signed, Satira Sark, MD, [facility] 04/17/2014 1:09 PM    [place] at [facility] [address], [place], [place] [address] Phone: [phone]; Fax: [phone]

## 2014-04-27 DIAGNOSIS — E782 Mixed hyperlipidemia: Secondary | ICD-10-CM | POA: Diagnosis not present

## 2014-04-27 DIAGNOSIS — E119 Type 2 diabetes mellitus without complications: Secondary | ICD-10-CM | POA: Diagnosis not present

## 2014-05-01 DIAGNOSIS — E1122 Type 2 diabetes mellitus with diabetic chronic kidney disease: Secondary | ICD-10-CM | POA: Diagnosis not present

## 2014-05-01 DIAGNOSIS — N182 Chronic kidney disease, stage 2 (mild): Secondary | ICD-10-CM | POA: Diagnosis not present

## 2014-05-01 DIAGNOSIS — G894 Chronic pain syndrome: Secondary | ICD-10-CM | POA: Diagnosis not present

## 2014-05-01 DIAGNOSIS — I10 Essential (primary) hypertension: Secondary | ICD-10-CM | POA: Diagnosis not present

## 2014-10-02 DIAGNOSIS — I10 Essential (primary) hypertension: Secondary | ICD-10-CM | POA: Diagnosis not present

## 2014-10-02 DIAGNOSIS — E039 Hypothyroidism, unspecified: Secondary | ICD-10-CM | POA: Diagnosis not present

## 2014-10-02 DIAGNOSIS — E1122 Type 2 diabetes mellitus with diabetic chronic kidney disease: Secondary | ICD-10-CM | POA: Diagnosis not present

## 2014-10-06 ENCOUNTER — Other Ambulatory Visit: Payer: Self-pay | Admitting: Cardiology

## 2014-10-08 ENCOUNTER — Other Ambulatory Visit: Payer: Self-pay | Admitting: Cardiology

## 2014-10-17 DIAGNOSIS — I2510 Atherosclerotic heart disease of native coronary artery without angina pectoris: Secondary | ICD-10-CM | POA: Diagnosis not present

## 2014-10-17 DIAGNOSIS — G894 Chronic pain syndrome: Secondary | ICD-10-CM | POA: Diagnosis not present

## 2014-10-17 DIAGNOSIS — E119 Type 2 diabetes mellitus without complications: Secondary | ICD-10-CM | POA: Diagnosis not present

## 2014-10-17 DIAGNOSIS — I10 Essential (primary) hypertension: Secondary | ICD-10-CM | POA: Diagnosis not present

## 2014-10-17 DIAGNOSIS — Z23 Encounter for immunization: Secondary | ICD-10-CM | POA: Diagnosis not present

## 2014-10-23 ENCOUNTER — Ambulatory Visit (INDEPENDENT_AMBULATORY_CARE_PROVIDER_SITE_OTHER): Payer: Medicare Other | Admitting: Cardiology

## 2014-10-23 ENCOUNTER — Encounter: Payer: Self-pay | Admitting: Cardiology

## 2014-10-23 VITALS — BP 126/82 | HR 89 | Ht 61.0 in | Wt 269.6 lb

## 2014-10-23 DIAGNOSIS — I10 Essential (primary) hypertension: Secondary | ICD-10-CM

## 2014-10-23 DIAGNOSIS — I2510 Atherosclerotic heart disease of native coronary artery without angina pectoris: Secondary | ICD-10-CM | POA: Diagnosis not present

## 2014-10-23 DIAGNOSIS — E1159 Type 2 diabetes mellitus with other circulatory complications: Secondary | ICD-10-CM | POA: Diagnosis not present

## 2014-10-23 NOTE — Progress Notes (Signed)
Cardiology Office Note  Date: 10/23/2014   ID: [name], DOB [birth year], MRN [id number]  PCP: Wende Neighbors, MD  Primary Cardiologist: [name], MD   Chief Complaint  Patient presents with  . Coronary Artery Disease    History of Present Illness: [name] is a 64 y.o. female last seen in September 2015. She presents for a follow-up visit. Fortunately, range stable from a cardiac perspective without progressing angina symptoms. She reports compliance with her medications which are outlined below.  She has had a recent bout of diarrhea, possible viral gastroenteritis. She has been able to keep down fluids and take her medications. Blood pressure and heart rate are stable.   Follow-up ECG today shows sinus rhythm with low voltage. Ischemic evaluation from last year is reviewed below, and we continue medical therapy.   Past Medical History  Diagnosis Date  . Type 2 diabetes mellitus   . Essential hypertension, benign   . Pulmonary embolism     Recurrent, previously on Coumadin  . Hiatal hernia   . Ovarian cancer   . COPD (chronic obstructive pulmonary disease)   . NSTEMI (non-ST elevated myocardial infarction)   . Coronary atherosclerosis of native coronary artery     Multivessel, DES distal left main, unsuccessful PCI circumflex 2/13, LVEF 50-55%  . Hypothyroidism   . Mixed hyperlipidemia   . Overweight(278.02)     Current Outpatient Prescriptions  Medication Sig Dispense Refill  . albuterol (PROVENTIL) (2.5 MG/3ML) 0.083% nebulizer solution Take 2.5 mg by nebulization every 6 (six) hours as needed. For wheezing. Combines with atrovent    . ALPRAZolam (XANAX) 1 MG tablet Take 1 mg by mouth at bedtime as needed for anxiety or sleep.     [name] amLODipine (NORVASC) 10 MG tablet Take 10 mg by mouth daily.    [name] aspirin EC 81 MG tablet Take 81 mg by mouth daily.    [name] atorvastatin (LIPITOR) 80 MG tablet Take 1 tablet (80 mg total) by mouth daily. 90  tablet 3  . calcium-vitamin D (OSCAL WITH D) 500-200 MG-UNIT per tablet Take 1 tablet by mouth daily.     . carvedilol (COREG) 6.25 MG tablet TAKE 1 TABLET (6.25 MG TOTAL) BY MOUTH 2 (TWO) TIMES DAILY WITH A MEAL. 60 tablet 5  . clopidogrel (PLAVIX) 75 MG tablet TAKE 1 TABLET (75 MG TOTAL) BY MOUTH DAILY. 90 tablet 3  . Ferrous Sulfate (IRON) 325 (65 FE) MG TABS Take 1 tablet by mouth daily after breakfast.     . Fluticasone-Salmeterol (ADVAIR) 250-50 MCG/DOSE AEPB Inhale 1 puff into the lungs 2 (two) times daily. 60 each 0  . glipiZIDE (GLUCOTROL XL) 5 MG 24 hr tablet Take 5 mg by mouth 2 (two) times daily.    [name] glucose blood test strip 1 each by Other route as needed for other. Use as instructed one touch ultra    . ipratropium (ATROVENT) 0.02 % nebulizer solution Take 500 mcg by nebulization every 6 (six) hours as needed. For shortness of breath. Combines it with Albuterol    . levothyroxine (SYNTHROID, LEVOTHROID) 150 MCG tablet Take 1 tablet (150 mcg total) by mouth every morning. 30 tablet 1  . loratadine (CLARITIN) 10 MG tablet Take 10 mg by mouth every morning.     . meclizine (ANTIVERT) 25 MG tablet Take 25 mg by mouth 2 (two) times daily.     . metFORMIN (GLUCOPHAGE) 500 MG tablet TAKE 1 TABLET BY MOUTH TWICE A DAY (  STOP ONGLYZA)  3  . NITROSTAT 0.4 MG SL tablet PLACE 1 TAB UNDER TONGUE EVERY 5 MIN AS NEEDED FOR CHEST PAIN (UP TO 3 DOSES) 25 tablet 1  . pantoprazole (PROTONIX) 40 MG tablet TAKE 2 TABLETS BY MOUTH TWICE A DAY BEFORE MEALS 120 tablet 3  . telmisartan (MICARDIS) 80 MG tablet Take 80 mg by mouth daily.  3  . traMADol (ULTRAM) 50 MG tablet Take 50 mg by mouth daily.   3  . triamterene-hydrochlorothiazide (DYAZIDE) 37.5-25 MG capsule     . [DISCONTINUED] esomeprazole (NEXIUM) 40 MG capsule Take 40 mg by mouth daily before breakfast.      No current facility-administered medications for this visit.    Allergies:  Codeine; Lipitor; Penicillins; and Sulfa antibiotics    Social History: The patient  reports that she quit smoking about 34 years ago. Her smoking use included Cigarettes. She has never used smokeless tobacco. She reports that she does not drink alcohol or use illicit drugs.   ROS:  Please see the history of present illness. Otherwise, complete review of systems is positive for none.  All other systems are reviewed and negative.   Physical Exam: VS:  BP 126/82 mmHg  Pulse 89  Ht 5\' 1"  (1.549 m)  Wt 269 lb 9.6 oz (122.29 kg)  BMI 50.97 kg/m2  SpO2 98%, BMI Body mass index is 50.97 kg/(m^2).  Wt Readings from Last 3 Encounters:  10/23/14 269 lb 9.6 oz (122.29 kg)  04/17/14 269 lb (122.018 kg)  10/10/13 272 lb (123.378 kg)     Morbidly obese, comfortable at rest.  HEENT: Conjunctiva and lids normal, oropharynx clear.  Neck: Supple ,increased girth, no carotid bruits, no thyromegaly.  Lungs: Clear to auscultation, nonlabored breathing at rest.  Cardiac: Regular rate and rhythm, no S3, soft systolic murmur, no pericardial rub.  Abdomen: Protuberent, nontender, bowel sounds present, no guarding or rebound.  Extremities: No pitting edema, distal pulses 2+.   ECG: ECG is ordered today.  Recent Labwork: 02/23/2014: ALT 20; AST 17     Component Value Date/Time   CHOL 185 02/23/2014 1142   TRIG 93 02/23/2014 1142   HDL 56 02/23/2014 1142   CHOLHDL 3.3 02/23/2014 1142   VLDL 19 02/23/2014 1142   LDLCALC 110* 02/23/2014 1142    Other Studies Reviewed Today:  Lexiscan Cardiolite in January 2015 showed evidence of scar in the apical and anteroseptal distribution without ischemia. There was a moderate-sized ischemic defect in the lateral wall consistent with the patient's circumflex disease that had not been able to be successfully intervened upon as of 2013 - actually described as being occluded at the end of her left main intervention in February 2013. LVEF 73%. It was felt that medical therapy was the most appropriate  option.   ASSESSMENT AND PLAN:  1. CAD status post DES to the distal left main in 2013 with unsuccessful PCI of the circumflex, now occluded. She has lateral ischemia by Cardiolite done in follow-up, however no active angina symptoms on medical therapy. Continue observation.  2. Morbid obesity.  3. Essential hypertension, blood pressure control is good today.  4. Type 2 diabetes mellitus, keep follow-up with Dr. [name].  Current medicines were reviewed at length with the patient today.   Orders Placed This Encounter  Procedures  . EKG 12-Lead    Disposition: FU with me in 6 months.   Signed, Satira Sark, MD, [facility] 10/23/2014 1:39 PM    [place]  at [place]. [address] [place], [place] [address] Phone: [phone]; Fax: [phone]

## 2014-10-23 NOTE — Patient Instructions (Signed)
Your physician wants you to follow-up in: 6 months with Dr.McDowell You will receive a reminder letter in the mail two months in advance. If you don't receive a letter, please call our office to schedule the follow-up appointment.     Your physician recommends that you continue on your current medications as directed. Please refer to the Current Medication list given to you today.     Thank you for choosing [facility] !        

## 2014-11-13 ENCOUNTER — Other Ambulatory Visit: Payer: Self-pay | Admitting: Cardiology

## 2014-11-15 ENCOUNTER — Other Ambulatory Visit: Payer: Self-pay | Admitting: Cardiology

## 2014-12-14 ENCOUNTER — Other Ambulatory Visit (HOSPITAL_COMMUNITY): Payer: Self-pay | Admitting: Internal Medicine

## 2014-12-14 DIAGNOSIS — Z1231 Encounter for screening mammogram for malignant neoplasm of breast: Secondary | ICD-10-CM

## 2014-12-24 ENCOUNTER — Ambulatory Visit (HOSPITAL_COMMUNITY): Payer: Medicare Other

## 2014-12-26 ENCOUNTER — Ambulatory Visit (HOSPITAL_COMMUNITY)
Admission: RE | Admit: 2014-12-26 | Discharge: 2014-12-26 | Disposition: A | Discharge status: Home / Self Care | Payer: Medicare Other | Source: Ambulatory Visit | Attending: Internal Medicine | Admitting: Internal Medicine

## 2014-12-26 DIAGNOSIS — Z1231 Encounter for screening mammogram for malignant neoplasm of breast: Secondary | ICD-10-CM

## 2015-04-10 DIAGNOSIS — E782 Mixed hyperlipidemia: Secondary | ICD-10-CM | POA: Diagnosis not present

## 2015-04-10 DIAGNOSIS — E119 Type 2 diabetes mellitus without complications: Secondary | ICD-10-CM | POA: Diagnosis not present

## 2015-04-10 DIAGNOSIS — N182 Chronic kidney disease, stage 2 (mild): Secondary | ICD-10-CM | POA: Diagnosis not present

## 2015-04-10 DIAGNOSIS — E039 Hypothyroidism, unspecified: Secondary | ICD-10-CM | POA: Diagnosis not present

## 2015-04-17 DIAGNOSIS — I10 Essential (primary) hypertension: Secondary | ICD-10-CM | POA: Diagnosis not present

## 2015-04-17 DIAGNOSIS — E039 Hypothyroidism, unspecified: Secondary | ICD-10-CM | POA: Diagnosis not present

## 2015-04-17 DIAGNOSIS — G894 Chronic pain syndrome: Secondary | ICD-10-CM | POA: Diagnosis not present

## 2015-04-17 DIAGNOSIS — N182 Chronic kidney disease, stage 2 (mild): Secondary | ICD-10-CM | POA: Diagnosis not present

## 2015-04-17 DIAGNOSIS — E1122 Type 2 diabetes mellitus with diabetic chronic kidney disease: Secondary | ICD-10-CM | POA: Diagnosis not present

## 2015-04-19 ENCOUNTER — Other Ambulatory Visit: Payer: Self-pay | Admitting: Cardiology

## 2015-04-20 ENCOUNTER — Other Ambulatory Visit: Payer: Self-pay | Admitting: Cardiology

## 2015-04-24 ENCOUNTER — Encounter: Payer: Self-pay | Admitting: Cardiology

## 2015-04-24 ENCOUNTER — Ambulatory Visit (INDEPENDENT_AMBULATORY_CARE_PROVIDER_SITE_OTHER): Payer: Medicare Other | Admitting: Cardiology

## 2015-04-24 VITALS — BP 128/78 | HR 83 | Ht 61.0 in | Wt 280.0 lb

## 2015-04-24 DIAGNOSIS — E1159 Type 2 diabetes mellitus with other circulatory complications: Secondary | ICD-10-CM | POA: Diagnosis not present

## 2015-04-24 DIAGNOSIS — E785 Hyperlipidemia, unspecified: Secondary | ICD-10-CM

## 2015-04-24 DIAGNOSIS — I10 Essential (primary) hypertension: Secondary | ICD-10-CM

## 2015-04-24 DIAGNOSIS — I2510 Atherosclerotic heart disease of native coronary artery without angina pectoris: Secondary | ICD-10-CM

## 2015-04-24 DIAGNOSIS — N183 Chronic kidney disease, stage 3 (moderate): Secondary | ICD-10-CM

## 2015-04-24 NOTE — Patient Instructions (Signed)

## 2015-04-24 NOTE — Progress Notes (Signed)
Cardiology Office Note  Date: 04/24/2015   ID: [name], DOB 1951/01/20, MRN [id number]  PCP: Wende Neighbors, MD  Primary Cardiologist: [name], MD   Chief Complaint  Patient presents with  . Coronary Artery Disease    History of Present Illness: [name] is a 65 y.o. female last seen in September 2016. She presents for a routine follow-up visit. Since last evaluation she has not had any significant angina symptoms or used nitroglycerin. She remains functional with ADLs including housework. She reports NYHA class II dyspnea.  I reviewed her medications which are outlined below. Cardiac regimen includes aspirin, Norvasc, Lipitor, Coreg, Plavix, Micardis, and as needed nitroglycerin.  We went over her recent lab work obtained by Dr. [name]. Hemoglobin A1c was 5.9 and her LDL was 93. Mild renal dysfunction is stable with creatinine of 1.3.  Last stress testing was in 2015, abnormal and consistent with her coronary anatomy including occlusion of the circumflex that has been managed medically.  Past Medical History  Diagnosis Date  . Type 2 diabetes mellitus ([place])   . Essential hypertension, benign   . Pulmonary embolism ([facility])     Recurrent, previously on Coumadin  . Hiatal hernia   . Ovarian cancer ([place])   . COPD (chronic obstructive pulmonary disease) ([place])   . NSTEMI (non-ST elevated myocardial infarction) ([place])   . Coronary atherosclerosis of native coronary artery     Multivessel, DES distal left main, unsuccessful PCI circumflex 2/13, LVEF 50-55%  . Hypothyroidism   . Mixed hyperlipidemia   . Overweight(278.02)     Past Surgical History  Procedure Laterality Date  . Abdominal surgery    . Cholecystectomy    . Appendectomy    . Cesarean section    . Tonsillectomy    . Breast lumpectomy    . Small intestine surgery  12/2007    Dr [name]  . Closure of abdominal wound/wound vac    . Cataract extraction w/phaco  01/29/2011    Procedure:  CATARACT EXTRACTION PHACO AND INTRAOCULAR LENS PLACEMENT (IOC);  Surgeon: [name];  Location: [facility];  Service: Ophthalmology;  Laterality: Left;  CDE: 1.81  . Left heart catheterization with coronary angiogram N/A 03/13/2011    Procedure: LEFT HEART CATHETERIZATION WITH CORONARY ANGIOGRAM;  Surgeon: [name], MD;  Location: [facility] CATH LAB;  Service: Cardiovascular;  Laterality: N/A;  . Percutaneous coronary stent intervention (pci-s) N/A 03/18/2011    Procedure: PERCUTANEOUS CORONARY STENT INTERVENTION (PCI-S);  Surgeon: [name], MD;  Location: [facility] CATH LAB;  Service: Cardiovascular;  Laterality: N/A;    Current Outpatient Prescriptions  Medication Sig Dispense Refill  . albuterol (PROVENTIL) (2.5 MG/3ML) 0.083% nebulizer solution Take 2.5 mg by nebulization every 6 (six) hours as needed. For wheezing. Combines with atrovent    . ALPRAZolam (XANAX) 1 MG tablet Take 1 mg by mouth at bedtime as needed for anxiety or sleep.     [name] amLODipine (NORVASC) 10 MG tablet Take 10 mg by mouth daily.    [name] aspirin EC 81 MG tablet Take 81 mg by mouth daily.    [name] atorvastatin (LIPITOR) 80 MG tablet TAKE 1 TABLET (80 MG TOTAL) BY MOUTH DAILY. 90 tablet 2  . calcium-vitamin D (OSCAL WITH D) 500-200 MG-UNIT per tablet Take 1 tablet by mouth daily.     . carvedilol (COREG) 6.25 MG tablet TAKE 1 TABLET (6.25 MG TOTAL) BY MOUTH 2 (TWO) TIMES DAILY WITH A MEAL. 60 tablet 5  . clopidogrel (  PLAVIX) 75 MG tablet TAKE 1 TABLET (75 MG TOTAL) BY MOUTH DAILY. 90 tablet 3  . Ferrous Sulfate (IRON) 325 (65 FE) MG TABS Take 1 tablet by mouth daily after breakfast.     . Fluticasone-Salmeterol (ADVAIR) 250-50 MCG/DOSE AEPB Inhale 1 puff into the lungs 2 (two) times daily. 60 each 0  . glipiZIDE (GLUCOTROL XL) 5 MG 24 hr tablet Take 5 mg by mouth 2 (two) times daily.    [name] glucose blood test strip 1 each by Other route as needed for other. Use as instructed one touch ultra    . ipratropium (ATROVENT) 0.02 % nebulizer  solution Take 500 mcg by nebulization every 6 (six) hours as needed. For shortness of breath. Combines it with Albuterol    . levothyroxine (SYNTHROID, LEVOTHROID) 150 MCG tablet Take 1 tablet (150 mcg total) by mouth every morning. 30 tablet 1  . loratadine (CLARITIN) 10 MG tablet Take 10 mg by mouth every morning.     . meclizine (ANTIVERT) 25 MG tablet Take 25 mg by mouth 2 (two) times daily.     . metFORMIN (GLUCOPHAGE) 500 MG tablet TAKE 1 TABLET BY MOUTH TWICE A DAY (STOP ONGLYZA)  3  . NITROSTAT 0.4 MG SL tablet PLACE 1 TAB UNDER TONGUE EVERY 5 MIN AS NEEDED FOR CHEST PAIN (UP TO 3 DOSES) 25 tablet 1  . pantoprazole (PROTONIX) 40 MG tablet TAKE 2 TABLETS BY MOUTH TWICE A DAY BEFORE MEALS 120 tablet 3  . telmisartan (MICARDIS) 80 MG tablet Take 80 mg by mouth daily.  3  . traMADol (ULTRAM) 50 MG tablet Take 50 mg by mouth daily.   3  . triamterene-hydrochlorothiazide (DYAZIDE) 37.5-25 MG capsule     . [DISCONTINUED] esomeprazole (NEXIUM) 40 MG capsule Take 40 mg by mouth daily before breakfast.      No current facility-administered medications for this visit.   Allergies:  Codeine; Penicillins; and Sulfa antibiotics   Social History: The patient  reports that she quit smoking about 35 years ago. Her smoking use included Cigarettes. She has never used smokeless tobacco. She reports that she does not drink alcohol or use illicit drugs.   ROS:  Please see the history of present illness. Otherwise, complete review of systems is positive for intermittent trouble with sinus congestion.  All other systems are reviewed and negative.   Physical Exam: VS:  BP 128/78 mmHg  Pulse 83  Ht 5\' 1"  (1.549 m)  Wt 280 lb (127.007 kg)  BMI 52.93 kg/m2  SpO2 98%, BMI Body mass index is 52.93 kg/(m^2).  Wt Readings from Last 3 Encounters:  04/24/15 280 lb (127.007 kg)  10/23/14 269 lb 9.6 oz (122.29 kg)  04/17/14 269 lb (122.018 kg)    Morbidly obese, comfortable at rest.  HEENT: Conjunctiva and  lids normal, oropharynx clear.  Neck: Supple ,increased girth, no carotid bruits, no thyromegaly.  Lungs: Clear to auscultation, nonlabored breathing at rest.  Cardiac: Regular rate and rhythm, no S3, soft systolic murmur, no pericardial rub.  Abdomen: Protuberent, nontender, bowel sounds present, no guarding or rebound.  Extremities: No pitting edema, distal pulses 2+.  ECG: I personally reviewed the prior tracing from 10/23/2014 which showed normal sinus rhythm with low voltage.  Recent Labwork:  March 2017: Potassium 4.0, BUN 36, creatinine 1.29, AST 17, ALT 17, Hgb 13.3, platelets 267, cholesterol 169, HDL 56, triglycerides 101, LDL 93, TSH 2.42, Hgb A1C 5.9  Other Studies Reviewed Today:  Lexiscan Cardiolite in January 2015 showed  evidence of scar in the apical and anteroseptal distribution without ischemia. There was a moderate-sized ischemic defect in the lateral wall consistent with the patient's circumflex disease that had not been able to be successfully intervened upon as of 2013 - actually described as being occluded at the end of her left main intervention in February 2013. LVEF 73%. It was felt that medical therapy was the most appropriate option.  Assessment and Plan:  1. Multivessel CAD status post DES to the distal left main and known occlusion of the circumflex that is being managed medically. She does not report any progressing angina symptoms on medical therapy, no specific changes were made today. Last stress test was within the last 2 years.  2. Hyperlipidemia, continues on statin therapy with recent LDL 93.  3. Type 2 diabetes mellitus followed by Dr. [name]. She has not required insulin. Recent hemoglobin A1c was 5.9.  4. Essential hypertension, blood pressure control is adequate today. Tinea current regimen including Norvasc, Coreg, Dyazide and Micardis.  5. CKD stage III, creatinine 1.3 by recent lab work. His has been relatively stable.  Current medicines  were reviewed with the patient today.  Disposition: FU with me in 6 months.   Signed, Satira Sark, MD, [facility] 04/24/2015 1:51 PM    [place] at [facility] [address], [place], [place] [address] Phone: [phone]; Fax: [phone]

## 2015-05-17 ENCOUNTER — Telehealth: Payer: Self-pay | Admitting: Cardiology

## 2015-05-17 NOTE — Telephone Encounter (Signed)
Called pt- she stated that her bp remains the same. She has  been sick all week and taking different OTC meds. I told her that some of those could raise her bp a little. She stated that she has a headache and I told her it could be from the pollen and a tylenol would help with a headache. I let her know if she gets worse she could go to ED, but has an appt. In the morning with Dr. [name].

## 2015-05-17 NOTE — Telephone Encounter (Signed)
Patient states that she checked her BP just now and it was 169/83. Stated that it has been up but she has been sick. Please advise. / tg

## 2015-05-18 DIAGNOSIS — I10 Essential (primary) hypertension: Secondary | ICD-10-CM | POA: Diagnosis not present

## 2015-05-18 DIAGNOSIS — J040 Acute laryngitis: Secondary | ICD-10-CM | POA: Diagnosis not present

## 2015-05-18 DIAGNOSIS — J301 Allergic rhinitis due to pollen: Secondary | ICD-10-CM | POA: Diagnosis not present

## 2015-06-30 ENCOUNTER — Other Ambulatory Visit: Payer: Self-pay | Admitting: Cardiology

## 2015-08-08 ENCOUNTER — Other Ambulatory Visit: Payer: Self-pay | Admitting: Cardiology

## 2015-09-18 DIAGNOSIS — E782 Mixed hyperlipidemia: Secondary | ICD-10-CM | POA: Diagnosis not present

## 2015-09-18 DIAGNOSIS — E1122 Type 2 diabetes mellitus with diabetic chronic kidney disease: Secondary | ICD-10-CM | POA: Diagnosis not present

## 2015-09-18 DIAGNOSIS — E039 Hypothyroidism, unspecified: Secondary | ICD-10-CM | POA: Diagnosis not present

## 2015-09-23 ENCOUNTER — Other Ambulatory Visit: Payer: Self-pay | Admitting: Cardiology

## 2015-09-25 DIAGNOSIS — E1122 Type 2 diabetes mellitus with diabetic chronic kidney disease: Secondary | ICD-10-CM | POA: Diagnosis not present

## 2015-09-25 DIAGNOSIS — Z23 Encounter for immunization: Secondary | ICD-10-CM | POA: Diagnosis not present

## 2015-09-25 DIAGNOSIS — E782 Mixed hyperlipidemia: Secondary | ICD-10-CM | POA: Diagnosis not present

## 2015-09-25 DIAGNOSIS — I10 Essential (primary) hypertension: Secondary | ICD-10-CM | POA: Diagnosis not present

## 2015-09-25 DIAGNOSIS — N183 Chronic kidney disease, stage 3 (moderate): Secondary | ICD-10-CM | POA: Diagnosis not present

## 2015-09-25 DIAGNOSIS — K219 Gastro-esophageal reflux disease without esophagitis: Secondary | ICD-10-CM | POA: Diagnosis not present

## 2015-11-18 NOTE — Progress Notes (Signed)
Cardiology Office Note  Date: 11/19/2015   ID: [name], DOB 1950-12-15, MRN [id number]  PCP: Wende Neighbors, MD  Primary Cardiologist: [name], MD   Chief Complaint  Patient presents with  . Coronary Artery Disease    History of Present Illness: [name] is a 65 y.o. female last seen in March. She presents for a routine follow-up visit. Since last encounter she does not report any exertional angina or increasing shortness of breath with typical activities. She saw Dr. [name] for a physical with lab work back in August, I reviewed the results below.  Myoview from January 2015 is outlined below, we have been managing her medically.  I reviewed her ECG today which shows sinus rhythm with decreased R wave progression.  Current cardiac regimen includes aspirin, Plavix, Norvasc, Lipitor, Coreg, Micardis, Dyazide, and as needed nitroglycerin.  Past Medical History:  Diagnosis Date  . COPD (chronic obstructive pulmonary disease) ([place])   . Coronary atherosclerosis of native coronary artery    Multivessel, DES distal left main, unsuccessful PCI circumflex 2/13, LVEF 50-55%  . Essential hypertension, benign   . Hiatal hernia   . Hypothyroidism   . Mixed hyperlipidemia   . NSTEMI (non-ST elevated myocardial infarction) ([place])   . Ovarian cancer ([place])   . Overweight(278.02)   . Pulmonary embolism ([facility])    Recurrent, previously on Coumadin  . Type 2 diabetes mellitus ([place])     Past Surgical History:  Procedure Laterality Date  . ABDOMINAL SURGERY    . APPENDECTOMY    . BREAST LUMPECTOMY    . CATARACT EXTRACTION W/PHACO  01/29/2011   Procedure: CATARACT EXTRACTION PHACO AND INTRAOCULAR LENS PLACEMENT (IOC);  Surgeon: [name];  Location: [facility];  Service: Ophthalmology;  Laterality: Left;  CDE: 1.81  . CESAREAN SECTION    . CHOLECYSTECTOMY    . Closure of abdominal wound/wound vac    . LEFT HEART CATHETERIZATION WITH CORONARY ANGIOGRAM N/A 03/13/2011   Procedure: LEFT HEART CATHETERIZATION WITH CORONARY ANGIOGRAM;  Surgeon: [name], MD;  Location: [facility] CATH LAB;  Service: Cardiovascular;  Laterality: N/A;  . PERCUTANEOUS CORONARY STENT INTERVENTION (PCI-S) N/A 03/18/2011   Procedure: PERCUTANEOUS CORONARY STENT INTERVENTION (PCI-S);  Surgeon: [name], MD;  Location: [facility] CATH LAB;  Service: Cardiovascular;  Laterality: N/A;  . SMALL INTESTINE SURGERY  12/2007   Dr [name]  . TONSILLECTOMY      Current Outpatient Prescriptions  Medication Sig Dispense Refill  . albuterol (PROVENTIL) (2.5 MG/3ML) 0.083% nebulizer solution Take 2.5 mg by nebulization every 6 (six) hours as needed. For wheezing. Combines with atrovent    . ALPRAZolam (XANAX) 1 MG tablet Take 1 mg by mouth at bedtime as needed for anxiety or sleep.     [name] amLODipine (NORVASC) 10 MG tablet Take 10 mg by mouth daily.    [name] aspirin EC 81 MG tablet Take 81 mg by mouth daily.    [name] atorvastatin (LIPITOR) 80 MG tablet TAKE 1 TABLET (80 MG TOTAL) BY MOUTH DAILY. 90 tablet 2  . calcium-vitamin D (OSCAL WITH D) 500-200 MG-UNIT per tablet Take 1 tablet by mouth daily.     . carvedilol (COREG) 6.25 MG tablet TAKE 1 TABLET (6.25 MG TOTAL) BY MOUTH 2 (TWO) TIMES DAILY WITH A MEAL. 60 tablet 5  . clopidogrel (PLAVIX) 75 MG tablet TAKE 1 TABLET (75 MG TOTAL) BY MOUTH DAILY. 90 tablet 3  . Ferrous Sulfate (IRON) 325 (65 FE) MG TABS Take 1 tablet by  mouth daily after breakfast.     . Fluticasone-Salmeterol (ADVAIR) 250-50 MCG/DOSE AEPB Inhale 1 puff into the lungs 2 (two) times daily. 60 each 0  . glipiZIDE (GLUCOTROL XL) 5 MG 24 hr tablet Take 5 mg by mouth 2 (two) times daily.    [name] glucose blood test strip 1 each by Other route as needed for other. Use as instructed one touch ultra    . ipratropium (ATROVENT) 0.02 % nebulizer solution Take 500 mcg by nebulization every 6 (six) hours as needed. For shortness of breath. Combines it with Albuterol    . levothyroxine (SYNTHROID, LEVOTHROID)  150 MCG tablet Take 1 tablet (150 mcg total) by mouth every morning. 30 tablet 1  . meclizine (ANTIVERT) 25 MG tablet Take 25 mg by mouth 2 (two) times daily.     . metFORMIN (GLUCOPHAGE) 500 MG tablet TAKE 1 TABLET BY MOUTH TWICE A DAY (STOP ONGLYZA)  3  . NITROSTAT 0.4 MG SL tablet PLACE 1 TAB UNDER TONGUE EVERY 5 MIN AS NEEDED FOR CHEST PAIN (UP TO 3 DOSES) 25 tablet 1  . pantoprazole (PROTONIX) 40 MG tablet Take 40 mg by mouth 2 (two) times daily.    [name] telmisartan (MICARDIS) 80 MG tablet Take 80 mg by mouth daily.  3  . traMADol (ULTRAM) 50 MG tablet Take 50 mg by mouth daily.   3  . triamterene-hydrochlorothiazide (DYAZIDE) 37.5-25 MG capsule      No current facility-administered medications for this visit.    Allergies:  Codeine; Penicillins; and Sulfa antibiotics   Social History: The patient  reports that she quit smoking about 35 years ago. Her smoking use included Cigarettes. She has never used smokeless tobacco. She reports that she does not drink alcohol or use drugs.   ROS:  Please see the history of present illness. Otherwise, complete review of systems is positive for chronic back pain.  All other systems are reviewed and negative.   Physical Exam: VS:  BP 124/67   Pulse 74   Ht 5\' 1"  (1.549 m)   Wt 281 lb (127.5 kg)   SpO2 99%   BMI 53.09 kg/m , BMI Body mass index is 53.09 kg/m.  Wt Readings from Last 3 Encounters:  11/19/15 281 lb (127.5 kg)  04/24/15 280 lb (127 kg)  10/23/14 269 lb 9.6 oz (122.3 kg)    Morbidly obese, comfortable at rest.  HEENT: Conjunctiva and lids normal, oropharynx clear.  Neck: Supple ,increased girth, no carotid bruits, no thyromegaly.  Lungs: Clear to auscultation, nonlabored breathing at rest.  Cardiac: Regular rate and rhythm, no S3, soft systolic murmur, no pericardial rub.  Abdomen: Protuberent, nontender, bowel sounds present, no guarding or rebound.  Extremities: No pitting edema, distal pulses 2+. Skin: Warm and  dry. Musculoskeletal: No kyphosis. Neuropsychiatric: Alert and oriented 3, affect appropriate.  ECG: I personally reviewed the tracing from 10/23/2014 showed sinus rhythm with low voltage.  Recent Labwork:  August 2017: BUN 40, creatinine 1.3, potassium 4.3, AST 21, ALT 24, Hgb 13.4, platelets 267, cholesterol 168, triglycerides 115, HDL 59, LDL 86, TSH 2.37, HgbA1c 5.8  Other Studies Reviewed Today:  Lexiscan Myoview 02/02/2013: IMPRESSION: 1.Abnormal Lexiscan myocardial perfusion imaging.  2. Large myocardial scar in the apex and mid to distal anteroseptal wall with no reversible ischemia.  3. Moderate-sized area of ischemia in the lateral wall/Left circumflex territory  4.  Abnormal pharmacologic stress EKG for ischemia  5.  Normal left ventricular ejection fraction.  6. Overall intermediate risk  study for major cardiac events. There is a large area of scar in the apex, a moderate-sized area of myocardium at jeopardy in the lateral wall corresponding to known left circumflex disease.  Assessment and Plan:  1. CAD status post DES to the left main with unsuccessful circumflex intervention in 2013. Circumflex is known to be occluded. Her last Cardiolite showed an area of scar with moderate peri-infarct ischemia in the lateral wall corresponding with this distribution. She remains clinical stable on medical therapy and we will continue with observation.  2. Hyperlipidemia, recent LDL 86 on Lipitor.  3. Type 2 diabetes mellitus, continues follow with Dr. [name]. Recent hemoglobin A1c was 5.8.  4. Morbid obesity. We continue to discuss weight loss. She is fairly inactive.  5. CKD stage 3, creatinine stable at 1.3.  Current medicines were reviewed with the patient today.   Orders Placed This Encounter  Procedures  . EKG 12-Lead    Disposition: Follow-up with me in 6 months.  Signed, Satira Sark, MD, [facility] 11/19/2015 1:47 PM     Medical Group  [facility]Care at [facility] [address] [place], [place] [address] Phone: [phone]; Fax: [phone]

## 2015-11-19 ENCOUNTER — Encounter: Payer: Self-pay | Admitting: Cardiology

## 2015-11-19 ENCOUNTER — Ambulatory Visit (INDEPENDENT_AMBULATORY_CARE_PROVIDER_SITE_OTHER): Payer: Medicare Other | Admitting: Cardiology

## 2015-11-19 VITALS — BP 124/67 | HR 74 | Ht 61.0 in | Wt 281.0 lb

## 2015-11-19 DIAGNOSIS — N183 Chronic kidney disease, stage 3 unspecified: Secondary | ICD-10-CM

## 2015-11-19 DIAGNOSIS — I10 Essential (primary) hypertension: Secondary | ICD-10-CM

## 2015-11-19 DIAGNOSIS — I2510 Atherosclerotic heart disease of native coronary artery without angina pectoris: Secondary | ICD-10-CM

## 2015-11-19 DIAGNOSIS — E782 Mixed hyperlipidemia: Secondary | ICD-10-CM | POA: Diagnosis not present

## 2015-11-19 NOTE — Patient Instructions (Signed)
Your physician wants you to follow-up in: 6 months You will receive a reminder letter in the mail two months in advance. If you don't receive a letter, please call our office to schedule the follow-up appointment.     Your physician recommends that you continue on your current medications as directed. Please refer to the Current Medication list given to you today.      Thank you for choosing [facility] !        

## 2016-01-09 DIAGNOSIS — Z0000 Encounter for general adult medical examination without abnormal findings: Secondary | ICD-10-CM | POA: Diagnosis not present

## 2016-02-11 ENCOUNTER — Other Ambulatory Visit: Payer: Self-pay | Admitting: Cardiology

## 2016-02-24 DIAGNOSIS — E1122 Type 2 diabetes mellitus with diabetic chronic kidney disease: Secondary | ICD-10-CM | POA: Diagnosis not present

## 2016-02-24 DIAGNOSIS — I10 Essential (primary) hypertension: Secondary | ICD-10-CM | POA: Diagnosis not present

## 2016-02-24 DIAGNOSIS — E782 Mixed hyperlipidemia: Secondary | ICD-10-CM | POA: Diagnosis not present

## 2016-02-24 DIAGNOSIS — E039 Hypothyroidism, unspecified: Secondary | ICD-10-CM | POA: Diagnosis not present

## 2016-02-27 DIAGNOSIS — E039 Hypothyroidism, unspecified: Secondary | ICD-10-CM | POA: Diagnosis not present

## 2016-02-27 DIAGNOSIS — E1122 Type 2 diabetes mellitus with diabetic chronic kidney disease: Secondary | ICD-10-CM | POA: Diagnosis not present

## 2016-02-27 DIAGNOSIS — I10 Essential (primary) hypertension: Secondary | ICD-10-CM | POA: Diagnosis not present

## 2016-02-27 DIAGNOSIS — N182 Chronic kidney disease, stage 2 (mild): Secondary | ICD-10-CM | POA: Diagnosis not present

## 2016-03-16 ENCOUNTER — Other Ambulatory Visit: Payer: Self-pay | Admitting: Cardiology

## 2016-03-16 MED ORDER — CARVEDILOL 6.25 MG PO TABS: 6.2500 mg | ORAL_TABLET | Freq: Two times a day (BID) | ORAL | 2 refills | Status: DC

## 2016-03-16 NOTE — Telephone Encounter (Signed)
90 day refill per pt request

## 2016-03-16 NOTE — Telephone Encounter (Signed)
Pt would like a 3 month supply carvedilol (COREG) 6.25 MG tablet [id number]  Sent in to [facility]

## 2016-05-02 ENCOUNTER — Other Ambulatory Visit: Payer: Self-pay | Admitting: Cardiology

## 2016-05-05 ENCOUNTER — Other Ambulatory Visit: Payer: Self-pay | Admitting: Cardiology

## 2016-05-11 NOTE — Progress Notes (Signed)
Cardiology Office Note  Date: 05/12/2016   ID: CHEVI LIM, DOB September 01, 1950, MRN [id number]  PCP: Wende Neighbors, MD  Primary Cardiologist: [name], MD   Chief Complaint  Patient presents with  . Coronary Artery Disease    History of Present Illness: [name] is a 66 y.o. female last seen in October 2017. She presents for a routine follow-up visit. Since last encounter she has not had any significant chest pain. She continues to follow regularly with Dr. [name]. I reviewed her most recent lab work from January, outlined below.  We discussed her medications today. She continues on aspirin, Norvasc, Lipitor, Coreg, Plavix, and Micardis. She has not required any nitroglycerin so far.  She reports NYHA class II dyspnea. No regular exercise regimen. She is morbidly obese, has not been able to lose weight. We have discussed her diet.  Past Medical History:  Diagnosis Date  . COPD (chronic obstructive pulmonary disease) ([place])   . Coronary atherosclerosis of native coronary artery    Multivessel, DES distal left main, unsuccessful PCI circumflex 2/13, LVEF 50-55%  . Essential hypertension, benign   . Hiatal hernia   . Hypothyroidism   . Mixed hyperlipidemia   . NSTEMI (non-ST elevated myocardial infarction) ([place])   . Ovarian cancer ([place])   . Overweight(278.02)   . Pulmonary embolism (H[facility])    Recurrent, previously on Coumadin  . Type 2 diabetes mellitus ([place])     Current Outpatient Prescriptions  Medication Sig Dispense Refill  . albuterol (PROVENTIL) (2.5 MG/3ML) 0.083% nebulizer solution Take 2.5 mg by nebulization every 6 (six) hours as needed. For wheezing. Combines with atrovent    . ALPRAZolam (XANAX) 1 MG tablet Take 1 mg by mouth at bedtime as needed for anxiety or sleep.     [name] amLODipine (NORVASC) 10 MG tablet Take 10 mg by mouth daily.    [name] aspirin EC 81 MG tablet Take 81 mg by mouth daily.    [name] atorvastatin (LIPITOR) 80 MG tablet TAKE 1 TABLET BY  MOUTH EVERY DAY 90 tablet 2  . atorvastatin (LIPITOR) 80 MG tablet TAKE 1 TABLET BY MOUTH EVERY DAY 90 tablet 2  . calcium-vitamin D (OSCAL WITH D) 500-200 MG-UNIT per tablet Take 1 tablet by mouth daily.     . carvedilol (COREG) 6.25 MG tablet Take 1 tablet (6.25 mg total) by mouth 2 (two) times daily with a meal. 180 tablet 2  . clopidogrel (PLAVIX) 75 MG tablet TAKE 1 TABLET (75 MG TOTAL) BY MOUTH DAILY. 90 tablet 3  . Ferrous Sulfate (IRON) 325 (65 FE) MG TABS Take 1 tablet by mouth daily after breakfast.     . Fluticasone-Salmeterol (ADVAIR) 250-50 MCG/DOSE AEPB Inhale 1 puff into the lungs 2 (two) times daily. 60 each 0  . glipiZIDE (GLUCOTROL XL) 5 MG 24 hr tablet Take 5 mg by mouth 2 (two) times daily.    [name] glucose blood test strip 1 each by Other route as needed for other. Use as instructed one touch ultra    . ipratropium (ATROVENT) 0.02 % nebulizer solution Take 500 mcg by nebulization every 6 (six) hours as needed. For shortness of breath. Combines it with Albuterol    . levothyroxine (SYNTHROID, LEVOTHROID) 150 MCG tablet Take 1 tablet (150 mcg total) by mouth every morning. 30 tablet 1  . meclizine (ANTIVERT) 25 MG tablet Take 25 mg by mouth 2 (two) times daily.     . metFORMIN (GLUCOPHAGE) 500 MG tablet TAKE 1  TABLET BY MOUTH TWICE A DAY (STOP ONGLYZA)  3  . NITROSTAT 0.4 MG SL tablet PLACE 1 TAB UNDER TONGUE EVERY 5 MIN AS NEEDED FOR CHEST PAIN (UP TO 3 DOSES) 25 tablet 1  . pantoprazole (PROTONIX) 40 MG tablet Take 40 mg by mouth 2 (two) times daily.    [name] telmisartan (MICARDIS) 80 MG tablet Take 80 mg by mouth daily.  3  . traMADol (ULTRAM) 50 MG tablet Take 50 mg by mouth daily.   3  . triamterene-hydrochlorothiazide (DYAZIDE) 37.5-25 MG capsule      No current facility-administered medications for this visit.    Allergies:  Codeine; Penicillins; and Sulfa antibiotics   Social History: The patient  reports that she quit smoking about 36 years ago. Her smoking use included  Cigarettes. She has never used smokeless tobacco. She reports that she does not drink alcohol or use drugs.   ROS:  Please see the history of present illness. Otherwise, complete review of systems is positive for recent accidental fall, no injury.  All other systems are reviewed and negative.   Physical Exam: VS:  BP 128/68   Pulse 89   Ht 5\' 1"  (1.549 m)   Wt 285 lb (129.3 kg)   SpO2 96%   BMI 53.85 kg/m , BMI Body mass index is 53.85 kg/m.  Wt Readings from Last 3 Encounters:  05/12/16 285 lb (129.3 kg)  11/19/15 281 lb (127.5 kg)  04/24/15 280 lb (127 kg)    Morbidly obese, comfortable at rest.  HEENT: Conjunctiva and lids normal, oropharynx clear.  Neck: Supple ,increased girth, no carotid bruits, no thyromegaly.  Lungs: Clear to auscultation, nonlabored breathing at rest.  Cardiac: Regular rate and rhythm, no S3, soft systolic murmur, no pericardial rub.  Abdomen: Protuberent, nontender, bowel sounds present, no guarding or rebound.  Extremities: No pitting edema, distal pulses 2+. Skin: Warm and dry. Musculoskeletal: No kyphosis. Neuropsychiatric: Alert and oriented 3, affect appropriate.  ECG: I personally reviewed the tracing from 11/19/2015 which showed sinus rhythm with decreased R wave progression.  Recent Labwork:  August 2017: BUN 40, creatinine 1.3, potassium 4.3, AST 21, ALT 24, Hgb 13.4, platelets 267, cholesterol 168, triglycerides 115, HDL 59, LDL 86, TSH 2.37, HgbA1c 5.03 February 2016: Cholesterol 192, HDL 51, triglycerides 134, LDL 116, BUN 33, creatinine 1.3, potassium 4.0, AST 19, ALT 21, hemoglobin A1c 5.9, hemoglobin 14.0, platelets 279  Other Studies Reviewed Today:  Lexiscan Myoview 02/02/2013: IMPRESSION: 1.Abnormal Lexiscan myocardial perfusion imaging.  2. Large myocardial scar in the apex and mid to distal anteroseptal wall with no reversible ischemia.  3. Moderate-sized area of ischemia in the lateral wall/Left circumflex  territory  4. Abnormal pharmacologic stress EKG for ischemia  5. Normal left ventricular ejection fraction.  6. Overall intermediate risk study for major cardiac events. There is a large area of scar in the apex, a moderate-sized area of myocardium at jeopardy in the lateral wall corresponding to known left circumflex disease.  Assessment and Plan:  1. Multivessel CAD with prior DES to the left main and unsuccessful circumflex PCI. She is stable without angina on medical therapy, last Myoview showed lateral ischemia consistent with her circumflex disease. We continue with observation for now.  2. Essential hypertension, systolic blood pressure in the 120s today. No changes made to current regimen.  3. Hyperlipidemia, and is on Lipitor. Recent LDL 116.  4. Morbid obesity. We have discussed weight loss.  Current medicines were reviewed with the patient today.  Disposition: Follow-up in 6 months.  Signed, Satira Sark, MD, [facility] 05/12/2016 11:19 AM    [facility] at [facility] [address], [place], [place] [address] Phone: [phone]; Fax: [phone]

## 2016-05-12 ENCOUNTER — Encounter: Payer: Self-pay | Admitting: Cardiology

## 2016-05-12 ENCOUNTER — Ambulatory Visit (INDEPENDENT_AMBULATORY_CARE_PROVIDER_SITE_OTHER): Payer: Medicare Other | Admitting: Cardiology

## 2016-05-12 VITALS — BP 128/68 | HR 89 | Ht 61.0 in | Wt 285.0 lb

## 2016-05-12 DIAGNOSIS — E782 Mixed hyperlipidemia: Secondary | ICD-10-CM

## 2016-05-12 DIAGNOSIS — I10 Essential (primary) hypertension: Secondary | ICD-10-CM

## 2016-05-12 DIAGNOSIS — I2510 Atherosclerotic heart disease of native coronary artery without angina pectoris: Secondary | ICD-10-CM

## 2016-05-12 NOTE — Patient Instructions (Signed)

## 2016-06-25 ENCOUNTER — Other Ambulatory Visit: Payer: Self-pay | Admitting: Cardiology

## 2016-06-25 MED ORDER — NITROGLYCERIN 0.4 MG SL SUBL: SUBLINGUAL_TABLET | SUBLINGUAL | 1 refills | Status: DC

## 2016-06-25 NOTE — Telephone Encounter (Signed)
°*  STAT* If patient is at the pharmacy, call can be transferred to refill team.   1. Which medications need to be refilled? (please list name of each medication and dose if known) NITROSTAT 0.4 MG SL tablet [[id number]]    2. Which pharmacy/location (including street and city if local pharmacy) is medication to be sent to? [facility]  3. Do they need a 30 day or 90 day supply?

## 2016-06-25 NOTE — Telephone Encounter (Signed)
Refilled  NTG per request 

## 2016-08-20 DIAGNOSIS — I10 Essential (primary) hypertension: Secondary | ICD-10-CM | POA: Diagnosis not present

## 2016-08-20 DIAGNOSIS — E1122 Type 2 diabetes mellitus with diabetic chronic kidney disease: Secondary | ICD-10-CM | POA: Diagnosis not present

## 2016-08-20 DIAGNOSIS — E039 Hypothyroidism, unspecified: Secondary | ICD-10-CM | POA: Diagnosis not present

## 2016-09-04 ENCOUNTER — Encounter: Payer: Self-pay | Admitting: Cardiovascular Disease

## 2016-09-07 ENCOUNTER — Other Ambulatory Visit: Payer: Self-pay | Admitting: Cardiology

## 2016-09-09 DIAGNOSIS — E039 Hypothyroidism, unspecified: Secondary | ICD-10-CM | POA: Diagnosis not present

## 2016-09-09 DIAGNOSIS — N182 Chronic kidney disease, stage 2 (mild): Secondary | ICD-10-CM | POA: Diagnosis not present

## 2016-09-09 DIAGNOSIS — I10 Essential (primary) hypertension: Secondary | ICD-10-CM | POA: Diagnosis not present

## 2016-09-09 DIAGNOSIS — E782 Mixed hyperlipidemia: Secondary | ICD-10-CM | POA: Diagnosis not present

## 2016-09-09 DIAGNOSIS — E1122 Type 2 diabetes mellitus with diabetic chronic kidney disease: Secondary | ICD-10-CM | POA: Diagnosis not present

## 2016-09-14 ENCOUNTER — Telehealth: Payer: Self-pay | Admitting: Cardiology

## 2016-09-14 NOTE — Telephone Encounter (Signed)
Pt has a question about a medication that Dr. [name] has given her, wants to make sure it's ok for her to take.

## 2016-09-14 NOTE — Telephone Encounter (Signed)
Pt was questioning a gel that Dr. [name] had prescribed her. I advised her to call his office and speak with his nurse about side effects. She voiced understanding and stated she will call his office.

## 2016-11-17 NOTE — Progress Notes (Signed)
Cardiology Office Note  Date: 11/18/2016   ID: [name], DOB 01-01-1951, MRN [id number]  PCP: [name], MD  Primary Cardiologist: [name], MD   Chief Complaint  Patient presents with  . Coronary Artery Disease    History of Present Illness: [name] is a 66 y.o. female last seen in April. She presents for a routine follow-up visit. Reports no angina symptoms or worsening shortness of breath with typical activities. She has not needed any nitroglycerin.  She does complain of worsening leg pain and cramping. Some of the symptoms sound potentially arthritic, but she is also concerned about potential side effects from Lipitor. She had previously been on Crestor although this was changed due to cost prior to it becoming generic.  I reviewed her most recent lab work from [name] as outlined below.  I personally reviewed her ECG today which shows sinus rhythm with decreased voltage in the precordial leads.  Past Medical History:  Diagnosis Date  . COPD (chronic obstructive pulmonary disease) ([place])   . Coronary atherosclerosis of native coronary artery    Multivessel, DES distal left main, unsuccessful PCI circumflex 2/13, LVEF 50-55%  . Essential hypertension, benign   . Hiatal hernia   . Hypothyroidism   . Mixed hyperlipidemia   . NSTEMI (non-ST elevated myocardial infarction) ([place])   . Ovarian cancer ([place])   . Overweight(278.02)   . Pulmonary embolism (HCC)    Recurrent, previously on Coumadin  . Type 2 diabetes mellitus ([place])     Past Surgical History:  Procedure Laterality Date  . ABDOMINAL SURGERY    . APPENDECTOMY    . BREAST LUMPECTOMY    . CATARACT EXTRACTION W/PHACO  01/29/2011   Procedure: CATARACT EXTRACTION PHACO AND INTRAOCULAR LENS PLACEMENT (IOC);  Surgeon: [name];  Location: [facility];  Service: Ophthalmology;  Laterality: Left;  CDE: 1.81  . CESAREAN SECTION    . CHOLECYSTECTOMY    . Closure of abdominal wound/wound  vac    . LEFT HEART CATHETERIZATION WITH CORONARY ANGIOGRAM N/A 03/13/2011   Procedure: LEFT HEART CATHETERIZATION WITH CORONARY ANGIOGRAM;  Surgeon: Chelsey Mocha, MD;  Location[facility] CATH LAB;  Service: Cardiovascular;  Laterality: N/A;  . PERCUTANEOUS CORONARY STENT INTERVENTION (PCI-S) N/A 03/18/2011   Procedure: PERCUTANEOUS CORONARY STENT INTERVENTION (PCI-S);  Surgeon: Chelsey Mocha, MD;  Location: [facility] CATH LAB;  Service: Cardiovascular;  Laterality: N/A;  . SMALL INTESTINE SURGERY  12/2007   Chelsey Arnoldo [name]  . TONSILLECTOMY      Current Outpatient Prescriptions  Medication Sig Dispense Refill  . albuterol (PROVENTIL) (2.5 MG/3ML) 0.083% nebulizer solution Take 2.5 mg by nebulization every 6 (six) hours as needed. For wheezing. Combines with atrovent    . ALPRAZolam (XANAX) 1 MG tablet Take 1 mg by mouth at bedtime as needed for anxiety or sleep.     [name] amLODipine (NORVASC) 10 MG tablet Take 10 mg by mouth daily.    [name] aspirin EC 81 MG tablet Take 81 mg by mouth daily.    [name] atorvastatin (LIPITOR) 80 MG tablet TAKE 1 TABLET BY MOUTH EVERY DAY 90 tablet 2  . calcium-vitamin D (OSCAL WITH D) 500-200 MG-UNIT per tablet Take 1 tablet by mouth daily.     . carvedilol (COREG) 6.25 MG tablet Take 1 tablet (6.25 mg total) by mouth 2 (two) times daily with a meal. 180 tablet 2  . clopidogrel (PLAVIX) 75 MG tablet TAKE 1 TABLET (75 MG TOTAL) BY MOUTH DAILY. 90 tablet  3  . Ferrous Sulfate (IRON) 325 (65 FE) MG TABS Take 1 tablet by mouth daily after breakfast.     . Fluticasone-Salmeterol (ADVAIR) 250-50 MCG/DOSE AEPB Inhale 1 puff into the lungs 2 (two) times daily. 60 each 0  . glipiZIDE (GLUCOTROL XL) 5 MG 24 hr tablet Take 5 mg by mouth 2 (two) times daily.    [name] glucose blood test strip 1 each by Other route as needed for other. Use as instructed one touch ultra    . ipratropium (ATROVENT) 0.02 % nebulizer solution Take 500 mcg by nebulization every 6 (six) hours as needed. For shortness of breath.  Combines it with Albuterol    . levothyroxine (SYNTHROID, LEVOTHROID) 150 MCG tablet Take 1 tablet (150 mcg total) by mouth every morning. 30 tablet 1  . meclizine (ANTIVERT) 25 MG tablet Take 25 mg by mouth 2 (two) times daily.     . metFORMIN (GLUCOPHAGE) 500 MG tablet TAKE 1 TABLET BY MOUTH TWICE A DAY (STOP ONGLYZA)  3  . nitroGLYCERIN (NITROSTAT) 0.4 MG SL tablet PLACE 1 TAB UNDER TONGUE EVERY 5 MIN AS NEEDED FOR CHEST PAIN (UP TO 3 DOSES) 25 tablet 1  . pantoprazole (PROTONIX) 40 MG tablet Take 40 mg by mouth 2 (two) times daily.    [name] telmisartan (MICARDIS) 80 MG tablet Take 80 mg by mouth daily.  3  . traMADol (ULTRAM) 50 MG tablet Take 50 mg by mouth daily.   3  . triamterene-hydrochlorothiazide (DYAZIDE) 37.5-25 MG capsule      No current facility-administered medications for this visit.    Allergies:  Codeine; Penicillins; and Sulfa antibiotics   Social History: The patient  reports that she quit smoking about 36 years ago. Her smoking use included Cigarettes. She has never used smokeless tobacco. She reports that she does not drink alcohol or use drugs.   ROS:  Please see the history of present illness. Otherwise, complete review of systems is positive for hearing loss.  All other systems are reviewed and negative.   Physical Exam: VS:  BP 130/72 (BP Location: Left Arm)   Pulse (!) 103   Ht 5\' 1"  (1.549 m)   Wt 280 lb (127 kg)   SpO2 97%   BMI 52.91 kg/m , BMI Body mass index is 52.91 kg/m.  Wt Readings from Last 3 Encounters:  11/18/16 280 lb (127 kg)  05/12/16 285 lb (129.3 kg)  11/19/15 281 lb (127.5 kg)    General: Morbidly obese woman, appears comfortable at rest. HEENT: Conjunctiva and lids normal, oropharynx clear. Neck: Supple, no elevated JVP or carotid bruits, no thyromegaly. Lungs: Clear to auscultation, nonlabored breathing at rest. Cardiac: Regular rate and rhythm, no S3, soft systolic murmur, no pericardial rub. Abdomen: Obese with pannus, nontender,  bowel sounds present. Extremities: Increased adipose tissue, distal pulses 2+. Skin: Warm and dry. Musculoskeletal: No kyphosis. Neuropsychiatric: Alert and oriented x3, affect grossly appropriate.  ECG: I personally reviewed the tracing from 11/19/2015 which showed sinus rhythm with decreased R wave progression.  Recent Labwork:  January 2018: Cholesterol 192, HDL 51, triglycerides 134, LDL 116, BUN 33, creatinine 1.31, potassium 4.0, AST 19, ALT 21, hemoglobin A1c 5.9, TSH 1.42, hemoglobin 14.0, platelets 279 July 2018: Hgb 13.9, platelets 258, BUN 35, creatinine 1.4, potassium 4.2, AST 18, ALT17, cholesterol 174, HDL 52, triglycerides 97, LDL 103, HgbA1c 6.3, TSH 1.25  Other Studies Reviewed Today:  Echocardiogram 03/15/2011: Study Conclusions  - Left ventricle: Possible mild posterior hypokinesis. However, even with  contrast injection posterior wall difficult to visualize. No other obvious segmental wall motion abnormalities. The cavity size was normal. Wall thickness was normal. Systolic function was normal. The estimated ejection fraction was in the range of 50% to 55%. Left ventricular diastolic function parameters were normal. - Aortic valve: Valve area: 1.88cm^2(VTI). Valve area: 1.88cm^2 (Vmax).  Lexiscan Myoview 02/02/2013: IMPRESSION: 1.Abnormal Lexiscan myocardial perfusion imaging.  2. Large myocardial scar in the apex and mid to distal anteroseptal wall with no reversible ischemia.  3. Moderate-sized area of ischemia in the lateral wall/Left circumflex territory  4.  Abnormal pharmacologic stress EKG for ischemia  5.  Normal left ventricular ejection fraction.  6. Overall intermediate risk study for major cardiac events. There is a large area of scar in the apex, a moderate-sized area of myocardium at jeopardy in the lateral wall corresponding to known left circumflex disease.  Assessment and Plan:  1. Multivessel CAD status post DES  to the left main and unsuccessful intervention to the circumflex. She has done well on medical therapy without progressive angina symptoms. Last ischemic testing was consistent with her circumflex disease. ECG reviewed and stable. Continue with observation.  2. Hyperlipidemia, and is on high-dose Lipitor. Recent lipids reviewed. She is concerned about possible side effects in the form of leg discomfort. She will hold Lipitor for a few weeks and let us know about her symptoms. We might consider switching back to Crestor.  3. Essential hypertension, continues on multimodal therapy with adequate control.  4. Morbid obesity. Weight is down about 5 pounds. She has cut out soft drinks.  Current medicines were reviewed with the patient today.   Orders Placed This Encounter  Procedures  . EKG 12-Lead    Disposition: Follow-up in 6 months.  Signed, Satira Sark, MD, [facility] 11/18/2016 11:20 AM    [facility] [place]. [address], [place], [place] [address] Phone: [phone]; Fax: [phone]

## 2016-11-18 ENCOUNTER — Encounter: Payer: Self-pay | Admitting: Cardiology

## 2016-11-18 ENCOUNTER — Ambulatory Visit (INDEPENDENT_AMBULATORY_CARE_PROVIDER_SITE_OTHER): Payer: Medicare Other | Admitting: Cardiology

## 2016-11-18 VITALS — BP 130/72 | HR 103 | Ht 61.0 in | Wt 280.0 lb

## 2016-11-18 DIAGNOSIS — I25119 Atherosclerotic heart disease of native coronary artery with unspecified angina pectoris: Secondary | ICD-10-CM

## 2016-11-18 DIAGNOSIS — E782 Mixed hyperlipidemia: Secondary | ICD-10-CM | POA: Diagnosis not present

## 2016-11-18 DIAGNOSIS — I10 Essential (primary) hypertension: Secondary | ICD-10-CM

## 2016-11-18 MED ORDER — ATORVASTATIN CALCIUM 80 MG PO TABS: 80.0000 mg | ORAL_TABLET | Freq: Every day | ORAL | 2 refills | Status: DC

## 2016-11-18 NOTE — Patient Instructions (Signed)
Medication Instructions:   Your physician has recommended you make the following change in your medication:   Hold lipitor for 2 weeks; then, call our office with the response/symptoms.  Continue all other medications the same.  Labwork:  NONE  Testing/Procedures:  NONE  Follow-Up:  Your physician recommends that you schedule a follow-up appointment in: 6 months. You will receive a reminder letter in the mail in about 4 months reminding you to call and schedule your appointment. If you don't receive this letter, please contact our office.  Any Other Special Instructions Will Be Listed Below (If Applicable).  If you need a refill on your cardiac medications before your next appointment, please call your pharmacy.

## 2016-12-02 ENCOUNTER — Telehealth: Payer: Self-pay | Admitting: Cardiology

## 2016-12-02 MED ORDER — ROSUVASTATIN CALCIUM 5 MG PO TABS: 5.0000 mg | ORAL_TABLET | Freq: Every day | ORAL | 3 refills | Status: DC

## 2016-12-02 NOTE — Telephone Encounter (Signed)
Patient informed and verbalized understanding of plan. 

## 2016-12-02 NOTE — Telephone Encounter (Signed)
Leg pain reduced with holding lipitor.

## 2016-12-02 NOTE — Telephone Encounter (Signed)
Per phone call--pt states that her legs are feeling better and not hurting like they were when she was taking the generic Lipitor

## 2016-12-02 NOTE — Telephone Encounter (Signed)
Stay off Lipitor.  Start Crestor at 5 mg daily.

## 2016-12-03 ENCOUNTER — Other Ambulatory Visit: Payer: Self-pay | Admitting: Cardiology

## 2016-12-14 DIAGNOSIS — E782 Mixed hyperlipidemia: Secondary | ICD-10-CM | POA: Diagnosis not present

## 2016-12-14 DIAGNOSIS — E1122 Type 2 diabetes mellitus with diabetic chronic kidney disease: Secondary | ICD-10-CM | POA: Diagnosis not present

## 2016-12-14 DIAGNOSIS — I10 Essential (primary) hypertension: Secondary | ICD-10-CM | POA: Diagnosis not present

## 2016-12-14 DIAGNOSIS — E039 Hypothyroidism, unspecified: Secondary | ICD-10-CM | POA: Diagnosis not present

## 2016-12-16 DIAGNOSIS — E782 Mixed hyperlipidemia: Secondary | ICD-10-CM | POA: Diagnosis not present

## 2016-12-16 DIAGNOSIS — E1122 Type 2 diabetes mellitus with diabetic chronic kidney disease: Secondary | ICD-10-CM | POA: Diagnosis not present

## 2017-03-17 DIAGNOSIS — Z6841 Body Mass Index (BMI) 40.0 and over, adult: Secondary | ICD-10-CM | POA: Diagnosis not present

## 2017-03-17 DIAGNOSIS — K219 Gastro-esophageal reflux disease without esophagitis: Secondary | ICD-10-CM | POA: Diagnosis not present

## 2017-03-17 DIAGNOSIS — I2510 Atherosclerotic heart disease of native coronary artery without angina pectoris: Secondary | ICD-10-CM | POA: Diagnosis not present

## 2017-03-17 DIAGNOSIS — E782 Mixed hyperlipidemia: Secondary | ICD-10-CM | POA: Diagnosis not present

## 2017-03-17 DIAGNOSIS — E1122 Type 2 diabetes mellitus with diabetic chronic kidney disease: Secondary | ICD-10-CM | POA: Diagnosis not present

## 2017-03-17 DIAGNOSIS — G4709 Other insomnia: Secondary | ICD-10-CM | POA: Diagnosis not present

## 2017-03-17 DIAGNOSIS — N182 Chronic kidney disease, stage 2 (mild): Secondary | ICD-10-CM | POA: Diagnosis not present

## 2017-03-17 DIAGNOSIS — J45909 Unspecified asthma, uncomplicated: Secondary | ICD-10-CM | POA: Diagnosis not present

## 2017-03-17 DIAGNOSIS — E039 Hypothyroidism, unspecified: Secondary | ICD-10-CM | POA: Diagnosis not present

## 2017-03-24 DIAGNOSIS — G894 Chronic pain syndrome: Secondary | ICD-10-CM | POA: Diagnosis not present

## 2017-03-24 DIAGNOSIS — I10 Essential (primary) hypertension: Secondary | ICD-10-CM | POA: Diagnosis not present

## 2017-03-24 DIAGNOSIS — K219 Gastro-esophageal reflux disease without esophagitis: Secondary | ICD-10-CM | POA: Diagnosis not present

## 2017-03-24 DIAGNOSIS — E782 Mixed hyperlipidemia: Secondary | ICD-10-CM | POA: Diagnosis not present

## 2017-03-24 DIAGNOSIS — H9209 Otalgia, unspecified ear: Secondary | ICD-10-CM | POA: Diagnosis not present

## 2017-03-24 DIAGNOSIS — G4709 Other insomnia: Secondary | ICD-10-CM | POA: Diagnosis not present

## 2017-03-24 DIAGNOSIS — E1122 Type 2 diabetes mellitus with diabetic chronic kidney disease: Secondary | ICD-10-CM | POA: Diagnosis not present

## 2017-03-24 DIAGNOSIS — I2510 Atherosclerotic heart disease of native coronary artery without angina pectoris: Secondary | ICD-10-CM | POA: Diagnosis not present

## 2017-03-24 DIAGNOSIS — N182 Chronic kidney disease, stage 2 (mild): Secondary | ICD-10-CM | POA: Diagnosis not present

## 2017-03-24 DIAGNOSIS — E039 Hypothyroidism, unspecified: Secondary | ICD-10-CM | POA: Diagnosis not present

## 2017-04-19 DIAGNOSIS — R69 Illness, unspecified: Secondary | ICD-10-CM | POA: Diagnosis not present

## 2017-04-26 DIAGNOSIS — R69 Illness, unspecified: Secondary | ICD-10-CM | POA: Diagnosis not present

## 2017-05-22 DIAGNOSIS — R69 Illness, unspecified: Secondary | ICD-10-CM | POA: Diagnosis not present

## 2017-05-25 NOTE — Progress Notes (Signed)
Cardiology Office Note  Date: 05/26/2017   ID: [name], DOB 1950-12-16, MRN [id number]  PCP: [name], MD  Primary Cardiologist: [name], MD   Chief Complaint  Patient presents with  . Coronary Artery Disease    History of Present Illness: [name] is a 67 y.o. female last seen in October 2018.  She presents for a follow-up visit, no new angina symptoms or nitroglycerin use.  She is fairly sedentary.  We have talked about a walking plan weight loss, but she complains of chronic foot pain that is limiting.  Current cardiac regimen includes aspirin, Norvasc, Coreg, Plavix, Crestor, Dyazide, Micardis, and as needed nitroglycerin.  She had follow-up lab work with Dr. [place] [name] in February which I reviewed.  Past Medical History:  Diagnosis Date  . COPD (chronic obstructive pulmonary disease) ([place])   . Coronary atherosclerosis of native coronary artery    Multivessel, DES distal left main, unsuccessful PCI circumflex 2/13, LVEF 50-55%  . Essential hypertension, benign   . Hiatal hernia   . Hypothyroidism   . Mixed hyperlipidemia   . NSTEMI (non-ST elevated myocardial infarction) ([place])   . Ovarian cancer ([place])   . Overweight(278.02)   . Pulmonary embolism (HCC)    Recurrent, previously on Coumadin  . Type 2 diabetes mellitus ([place])     Past Surgical History:  Procedure Laterality Date  . ABDOMINAL SURGERY    . APPENDECTOMY    . BREAST LUMPECTOMY    . CATARACT EXTRACTION W/PHACO  01/29/2011   Procedure: CATARACT EXTRACTION PHACO AND INTRAOCULAR LENS PLACEMENT (IOC);  Surgeon: [name];  Location: [facility];  Service: Ophthalmology;  Laterality: Left;  CDE: 1.81  . CESAREAN SECTION    . CHOLECYSTECTOMY    . Closure of abdominal wound/wound vac    . LEFT HEART CATHETERIZATION WITH CORONARY ANGIOGRAM N/A 03/13/2011   Procedure: LEFT HEART CATHETERIZATION WITH CORONARY ANGIOGRAM;  Surgeon: [name], MD;  Location: [facility] CATH LAB;  Service:  Cardiovascular;  Laterality: N/A;  . PERCUTANEOUS CORONARY STENT INTERVENTION (PCI-S) N/A 03/18/2011   Procedure: PERCUTANEOUS CORONARY STENT INTERVENTION (PCI-S);  Surgeon: [name], MD;  Location: [facility] CATH LAB;  Service: Cardiovascular;  Laterality: N/A;  . SMALL INTESTINE SURGERY  12/2007   Dr [name]  . TONSILLECTOMY      Current Outpatient Medications  Medication Sig Dispense Refill  . albuterol (PROVENTIL) (2.5 MG/3ML) 0.083% nebulizer solution Take 2.5 mg by nebulization every 6 (six) hours as needed. For wheezing. Combines with atrovent    . ALPRAZolam (XANAX) 1 MG tablet Take 1 mg by mouth at bedtime as needed for anxiety or sleep.     [name] amLODipine (NORVASC) 10 MG tablet Take 10 mg by mouth daily.    [name] aspirin EC 81 MG tablet Take 81 mg by mouth daily.    [name] BREO ELLIPTA 100-25 MCG/INH AEPB INHALE 1 PUFF INTO THE LUNGS DAILY  5  . calcium-vitamin D (OSCAL WITH D) 500-200 MG-UNIT per tablet Take 1 tablet by mouth daily.     . carvedilol (COREG) 6.25 MG tablet TAKE 1 TABLET (6.25 MG TOTAL) BY MOUTH 2 (TWO) TIMES DAILY WITH A MEAL. 180 tablet 3  . clopidogrel (PLAVIX) 75 MG tablet TAKE 1 TABLET (75 MG TOTAL) BY MOUTH DAILY. 90 tablet 3  . Ferrous Sulfate (IRON) 325 (65 FE) MG TABS Take 1 tablet by mouth daily after breakfast.     . glipiZIDE (GLUCOTROL XL) 5 MG 24 hr tablet Take 5  mg by mouth 2 (two) times daily.    [name] glucose blood test strip 1 each by Other route as needed for other. Use as instructed one touch ultra    . levothyroxine (SYNTHROID, LEVOTHROID) 150 MCG tablet Take 1 tablet (150 mcg total) by mouth every morning. 30 tablet 1  . meclizine (ANTIVERT) 25 MG tablet Take 25 mg by mouth 2 (two) times daily.     . metFORMIN (GLUCOPHAGE) 500 MG tablet TAKE 1 TABLET BY MOUTH TWICE A DAY (STOP ONGLYZA)  3  . nitroGLYCERIN (NITROSTAT) 0.4 MG SL tablet PLACE 1 TAB UNDER TONGUE EVERY 5 MIN AS NEEDED FOR CHEST PAIN (UP TO 3 DOSES) 25 tablet 1  . pantoprazole (PROTONIX) 40 MG tablet  Take 40 mg by mouth 2 (two) times daily.    . rosuvastatin (CRESTOR) 10 MG tablet Take 10 mg by mouth daily.    [name] telmisartan (MICARDIS) 80 MG tablet Take 80 mg by mouth daily.  3  . triamterene-hydrochlorothiazide (DYAZIDE) 37.5-25 MG capsule      No current facility-administered medications for this visit.    Allergies:  Codeine; Penicillins; and Sulfa antibiotics   Social History: The patient  reports that she quit smoking about 37 years ago. Her smoking use included cigarettes. She has never used smokeless tobacco. She reports that she does not drink alcohol or use drugs.   ROS:  Please see the history of present illness. Otherwise, complete review of systems is positive for chronic arthritic pain.  All other systems are reviewed and negative.   Physical Exam: VS:  BP 132/84   Pulse 85   Ht 5\' 1"  (1.549 m)   Wt 280 lb (127 kg)   SpO2 98%   BMI 52.91 kg/m , BMI Body mass index is 52.91 kg/m.  Wt Readings from Last 3 Encounters:  05/26/17 280 lb (127 kg)  11/18/16 280 lb (127 kg)  05/12/16 285 lb (129.3 kg)    General: Morbidly obese, appears comfortable at rest. HEENT: Conjunctiva and lids normal, oropharynx clear. Neck: Supple, no elevated JVP or carotid bruits, no thyromegaly. Lungs: Clear to auscultation, nonlabored breathing at rest. Cardiac: Regular rate and rhythm, no S3, soft systolic murmur. Abdomen: Soft, nontender, bowel sounds present. Extremities: No pitting edema, distal pulses 2+. Skin: Warm and dry. Musculoskeletal: No kyphosis. Neuropsychiatric: Alert and oriented x3, affect grossly appropriate.  ECG: I personally reviewed the tracing from 11/18/2016 with nonspecific ST-T changes and possible old inferior infarct pattern.  Recent Labwork:  February 2019: Hemoglobin 14.1, platelets 278, BUN 35, creatinine 1.36, potassium 4.7, AST 19, ALT 19, cholesterol 218, triglycerides 102, HDL 59, LDL 139, hemoglobin A1c 6.0  Other Studies Reviewed  Today:  Echocardiogram 03/15/2011: Study Conclusions  - Left ventricle: Possible mild posterior hypokinesis. However, even with contrast injection posterior wall difficult to visualize. No other obvious segmental wall motion abnormalities. The cavity size was normal. Wall thickness was normal. Systolic function was normal. The estimated ejection fraction was in the range of 50% to 55%. Left ventricular diastolic function parameters were normal. - Aortic valve: Valve area: 1.88cm^2(VTI). Valve area: 1.88cm^2 (Vmax).  Lexiscan Myoview 02/02/2013: IMPRESSION: 1.Abnormal Lexiscan myocardial perfusion imaging.  2. Large myocardial scar in the apex and mid to distal anteroseptal wall with no reversible ischemia.  3. Moderate-sized area of ischemia in the lateral wall/Left circumflex territory  4. Abnormal pharmacologic stress EKG for ischemia  5. Normal left ventricular ejection fraction.  6. Overall intermediate risk study for major cardiac events. There  is a large area of scar in the apex, a moderate-sized area of myocardium at jeopardy in the lateral wall corresponding to known left circumflex disease.  Assessment and Plan:  1.  Multivessel CAD status post DES intervention to the left main with unsuccessful PCI of the circumflex.  We are continuing with observation on medical therapy in the absence of progressive angina symptoms.  Last formal ischemic testing was in 2015.  She continues on dual antiplatelet therapy.  2.  Mixed hyperlipidemia, she has been switched to Crestor, dose increased to 10 mg daily based on recent lab work.  3.  Essential hypertension, no changes made to present regimen with adequate blood pressure control today.  4.  Morbid obesity.  Patient is sedentary, she complains of arthritic pain that limits her ambulation.  Current medicines were reviewed with the patient today.  Disposition: Follow-up in 6 months.  Signed, Satira Sark, MD, [facility] 05/26/2017 10:56 AM    [facility] [place]. [address], [place], [place] [address] Phone: [phone]; Fax: [phone]

## 2017-05-26 ENCOUNTER — Ambulatory Visit: Payer: Medicare HMO | Admitting: Cardiology

## 2017-05-26 ENCOUNTER — Encounter: Payer: Self-pay | Admitting: Cardiology

## 2017-05-26 VITALS — BP 132/84 | HR 85 | Ht 61.0 in | Wt 280.0 lb

## 2017-05-26 DIAGNOSIS — I10 Essential (primary) hypertension: Secondary | ICD-10-CM | POA: Diagnosis not present

## 2017-05-26 DIAGNOSIS — I25119 Atherosclerotic heart disease of native coronary artery with unspecified angina pectoris: Secondary | ICD-10-CM

## 2017-05-26 DIAGNOSIS — E782 Mixed hyperlipidemia: Secondary | ICD-10-CM

## 2017-05-26 NOTE — Patient Instructions (Signed)

## 2017-06-16 DIAGNOSIS — I10 Essential (primary) hypertension: Secondary | ICD-10-CM | POA: Diagnosis not present

## 2017-06-16 DIAGNOSIS — E119 Type 2 diabetes mellitus without complications: Secondary | ICD-10-CM | POA: Diagnosis not present

## 2017-06-16 DIAGNOSIS — E782 Mixed hyperlipidemia: Secondary | ICD-10-CM | POA: Diagnosis not present

## 2017-06-16 DIAGNOSIS — G894 Chronic pain syndrome: Secondary | ICD-10-CM | POA: Diagnosis not present

## 2017-06-16 DIAGNOSIS — I2510 Atherosclerotic heart disease of native coronary artery without angina pectoris: Secondary | ICD-10-CM | POA: Diagnosis not present

## 2017-06-16 DIAGNOSIS — B86 Scabies: Secondary | ICD-10-CM | POA: Diagnosis not present

## 2017-06-23 DIAGNOSIS — E039 Hypothyroidism, unspecified: Secondary | ICD-10-CM | POA: Diagnosis not present

## 2017-06-23 DIAGNOSIS — N182 Chronic kidney disease, stage 2 (mild): Secondary | ICD-10-CM | POA: Diagnosis not present

## 2017-06-23 DIAGNOSIS — G894 Chronic pain syndrome: Secondary | ICD-10-CM | POA: Diagnosis not present

## 2017-06-23 DIAGNOSIS — E782 Mixed hyperlipidemia: Secondary | ICD-10-CM | POA: Diagnosis not present

## 2017-06-23 DIAGNOSIS — I2510 Atherosclerotic heart disease of native coronary artery without angina pectoris: Secondary | ICD-10-CM | POA: Diagnosis not present

## 2017-06-23 DIAGNOSIS — E1122 Type 2 diabetes mellitus with diabetic chronic kidney disease: Secondary | ICD-10-CM | POA: Diagnosis not present

## 2017-06-23 DIAGNOSIS — R69 Illness, unspecified: Secondary | ICD-10-CM | POA: Diagnosis not present

## 2017-06-23 DIAGNOSIS — J45909 Unspecified asthma, uncomplicated: Secondary | ICD-10-CM | POA: Diagnosis not present

## 2017-06-23 DIAGNOSIS — I10 Essential (primary) hypertension: Secondary | ICD-10-CM | POA: Diagnosis not present

## 2017-06-23 DIAGNOSIS — K219 Gastro-esophageal reflux disease without esophagitis: Secondary | ICD-10-CM | POA: Diagnosis not present

## 2017-07-16 ENCOUNTER — Other Ambulatory Visit: Payer: Self-pay | Admitting: Cardiology

## 2017-07-22 DIAGNOSIS — R69 Illness, unspecified: Secondary | ICD-10-CM | POA: Diagnosis not present

## 2017-07-23 DIAGNOSIS — R69 Illness, unspecified: Secondary | ICD-10-CM | POA: Diagnosis not present

## 2017-08-14 DIAGNOSIS — R69 Illness, unspecified: Secondary | ICD-10-CM | POA: Diagnosis not present

## 2017-08-18 ENCOUNTER — Other Ambulatory Visit: Payer: Self-pay | Admitting: Cardiology

## 2017-09-06 DIAGNOSIS — R69 Illness, unspecified: Secondary | ICD-10-CM | POA: Diagnosis not present

## 2017-09-29 DIAGNOSIS — R69 Illness, unspecified: Secondary | ICD-10-CM | POA: Diagnosis not present

## 2017-10-13 DIAGNOSIS — E1122 Type 2 diabetes mellitus with diabetic chronic kidney disease: Secondary | ICD-10-CM | POA: Diagnosis not present

## 2017-10-13 DIAGNOSIS — N182 Chronic kidney disease, stage 2 (mild): Secondary | ICD-10-CM | POA: Diagnosis not present

## 2017-10-13 DIAGNOSIS — I10 Essential (primary) hypertension: Secondary | ICD-10-CM | POA: Diagnosis not present

## 2017-10-13 DIAGNOSIS — J45909 Unspecified asthma, uncomplicated: Secondary | ICD-10-CM | POA: Diagnosis not present

## 2017-10-13 DIAGNOSIS — G894 Chronic pain syndrome: Secondary | ICD-10-CM | POA: Diagnosis not present

## 2017-10-13 DIAGNOSIS — K219 Gastro-esophageal reflux disease without esophagitis: Secondary | ICD-10-CM | POA: Diagnosis not present

## 2017-10-13 DIAGNOSIS — E782 Mixed hyperlipidemia: Secondary | ICD-10-CM | POA: Diagnosis not present

## 2017-10-13 DIAGNOSIS — E039 Hypothyroidism, unspecified: Secondary | ICD-10-CM | POA: Diagnosis not present

## 2017-10-13 DIAGNOSIS — Z6841 Body Mass Index (BMI) 40.0 and over, adult: Secondary | ICD-10-CM | POA: Diagnosis not present

## 2017-10-13 DIAGNOSIS — I2510 Atherosclerotic heart disease of native coronary artery without angina pectoris: Secondary | ICD-10-CM | POA: Diagnosis not present

## 2017-10-13 DIAGNOSIS — G4709 Other insomnia: Secondary | ICD-10-CM | POA: Diagnosis not present

## 2017-10-20 DIAGNOSIS — I2510 Atherosclerotic heart disease of native coronary artery without angina pectoris: Secondary | ICD-10-CM | POA: Diagnosis not present

## 2017-10-20 DIAGNOSIS — K219 Gastro-esophageal reflux disease without esophagitis: Secondary | ICD-10-CM | POA: Diagnosis not present

## 2017-10-20 DIAGNOSIS — J45909 Unspecified asthma, uncomplicated: Secondary | ICD-10-CM | POA: Diagnosis not present

## 2017-10-20 DIAGNOSIS — R69 Illness, unspecified: Secondary | ICD-10-CM | POA: Diagnosis not present

## 2017-10-20 DIAGNOSIS — E782 Mixed hyperlipidemia: Secondary | ICD-10-CM | POA: Diagnosis not present

## 2017-10-20 DIAGNOSIS — I10 Essential (primary) hypertension: Secondary | ICD-10-CM | POA: Diagnosis not present

## 2017-10-20 DIAGNOSIS — E1122 Type 2 diabetes mellitus with diabetic chronic kidney disease: Secondary | ICD-10-CM | POA: Diagnosis not present

## 2017-10-20 DIAGNOSIS — E039 Hypothyroidism, unspecified: Secondary | ICD-10-CM | POA: Diagnosis not present

## 2017-10-20 DIAGNOSIS — N182 Chronic kidney disease, stage 2 (mild): Secondary | ICD-10-CM | POA: Diagnosis not present

## 2017-10-20 DIAGNOSIS — M545 Low back pain: Secondary | ICD-10-CM | POA: Diagnosis not present

## 2017-10-22 DIAGNOSIS — R69 Illness, unspecified: Secondary | ICD-10-CM | POA: Diagnosis not present

## 2017-11-14 DIAGNOSIS — R69 Illness, unspecified: Secondary | ICD-10-CM | POA: Diagnosis not present

## 2017-12-07 ENCOUNTER — Other Ambulatory Visit: Payer: Self-pay | Admitting: *Deleted

## 2017-12-07 ENCOUNTER — Other Ambulatory Visit: Payer: Self-pay | Admitting: Cardiology

## 2017-12-07 DIAGNOSIS — R69 Illness, unspecified: Secondary | ICD-10-CM | POA: Diagnosis not present

## 2017-12-07 MED ORDER — CARVEDILOL 6.25 MG PO TABS: 6.2500 mg | ORAL_TABLET | Freq: Two times a day (BID) | ORAL | 3 refills | Status: DC

## 2017-12-14 DIAGNOSIS — E785 Hyperlipidemia, unspecified: Secondary | ICD-10-CM | POA: Diagnosis not present

## 2017-12-14 DIAGNOSIS — J449 Chronic obstructive pulmonary disease, unspecified: Secondary | ICD-10-CM | POA: Diagnosis not present

## 2017-12-14 DIAGNOSIS — N183 Chronic kidney disease, stage 3 (moderate): Secondary | ICD-10-CM | POA: Diagnosis not present

## 2017-12-14 DIAGNOSIS — E1159 Type 2 diabetes mellitus with other circulatory complications: Secondary | ICD-10-CM | POA: Diagnosis not present

## 2017-12-14 DIAGNOSIS — I25119 Atherosclerotic heart disease of native coronary artery with unspecified angina pectoris: Secondary | ICD-10-CM | POA: Diagnosis not present

## 2017-12-14 DIAGNOSIS — E1122 Type 2 diabetes mellitus with diabetic chronic kidney disease: Secondary | ICD-10-CM | POA: Diagnosis not present

## 2017-12-14 DIAGNOSIS — I2782 Chronic pulmonary embolism: Secondary | ICD-10-CM | POA: Diagnosis not present

## 2017-12-14 DIAGNOSIS — Z6841 Body Mass Index (BMI) 40.0 and over, adult: Secondary | ICD-10-CM | POA: Diagnosis not present

## 2017-12-14 DIAGNOSIS — E039 Hypothyroidism, unspecified: Secondary | ICD-10-CM | POA: Diagnosis not present

## 2017-12-15 NOTE — Progress Notes (Signed)
Cardiology Office Note  Date: 12/16/2017   ID: [name], DOB [birth year], MRN [id number]  PCP: Chelsey Squibb, MD  Primary Cardiologist: Chelsey Lesches, MD   Chief Complaint  Patient presents with  . Coronary Artery Disease    History of Present Illness: [name] is a 67 y.o. female last seen in May.  She is here for a routine visit.  She does not report any angina symptoms or nitroglycerin use.  Remains functional with ADLs around her home.  He continues to follow with Dr. Nevada [name].  I reviewed her interval lab work from September which is outlined below.  She tells me that Crestor was increased to 40 mg daily, her LDL was 114.  I personally reviewed her ECG today which shows normal sinus rhythm.  Past Medical History:  Diagnosis Date  . COPD (chronic obstructive pulmonary disease) ([place])   . Coronary atherosclerosis of native coronary artery    Multivessel, DES distal left main, unsuccessful PCI circumflex 2/13, LVEF 50-55%  . Essential hypertension, benign   . Hiatal hernia   . Hypothyroidism   . Mixed hyperlipidemia   . NSTEMI (non-ST elevated myocardial infarction) ([place])   . Ovarian cancer ([place])   . Overweight(278.02)   . Pulmonary embolism ([facility])    Recurrent, previously on Coumadin  . Type 2 diabetes mellitus ([place])     Past Surgical History:  Procedure Laterality Date  . ABDOMINAL SURGERY    . APPENDECTOMY    . BREAST LUMPECTOMY    . CATARACT EXTRACTION W/PHACO  01/29/2011   Procedure: CATARACT EXTRACTION PHACO AND INTRAOCULAR LENS PLACEMENT (IOC);  Surgeon: [name];  Location: [facility];  Service: Ophthalmology;  Laterality: Left;  CDE: 1.81  . CESAREAN SECTION    . CHOLECYSTECTOMY    . Closure of abdominal wound/wound vac    . LEFT HEART CATHETERIZATION WITH CORONARY ANGIOGRAM N/A 03/13/2011   Procedure: LEFT HEART CATHETERIZATION WITH CORONARY ANGIOGRAM;  Surgeon: [name], MD;  Location[facility] CATH LAB;  Service: Cardiovascular;   Laterality: N/A;  . PERCUTANEOUS CORONARY STENT INTERVENTION (PCI-S) N/A 03/18/2011   Procedure: PERCUTANEOUS CORONARY STENT INTERVENTION (PCI-S);  Surgeon: [name], MD;  Location: [facility] CATH LAB;  Service: Cardiovascular;  Laterality: N/A;  . SMALL INTESTINE SURGERY  12/2007   Dr [name]  . TONSILLECTOMY      Current Outpatient Medications  Medication Sig Dispense Refill  . albuterol (PROVENTIL) (2.5 MG/3ML) 0.083% nebulizer solution Take 2.5 mg by nebulization every 6 (six) hours as needed. For wheezing. Combines with atrovent    . ALPRAZolam (XANAX) 1 MG tablet Take 1 mg by mouth at bedtime as needed for anxiety or sleep.     [name] amLODipine (NORVASC) 10 MG tablet Take 10 mg by mouth daily.    [name] aspirin EC 81 MG tablet Take 81 mg by mouth daily.    [name] BREO ELLIPTA 100-25 MCG/INH AEPB INHALE 1 PUFF INTO THE LUNGS DAILY  5  . calcium-vitamin D (OSCAL WITH D) 500-200 MG-UNIT per tablet Take 1 tablet by mouth daily.     . carvedilol (COREG) 6.25 MG tablet Take 1 tablet (6.25 mg total) by mouth 2 (two) times daily with a meal. 180 tablet 3  . carvedilol (COREG) 6.25 MG tablet TAKE 1 TABLET (6.25 MG TOTAL) BY MOUTH 2 (TWO) TIMES DAILY WITH A MEAL. 180 tablet 3  . clopidogrel (PLAVIX) 75 MG tablet TAKE 1 TABLET BY MOUTH EVERY DAY 90 tablet 3  . Ferrous Sulfate (  IRON) 325 (65 FE) MG TABS Take 1 tablet by mouth daily after breakfast.     . glipiZIDE (GLUCOTROL XL) 5 MG 24 hr tablet Take 5 mg by mouth 2 (two) times daily.    [name] glucose blood test strip 1 each by Other route as needed for other. Use as instructed one touch ultra    . levothyroxine (SYNTHROID, LEVOTHROID) 150 MCG tablet Take 1 tablet (150 mcg total) by mouth every morning. 30 tablet 1  . meclizine (ANTIVERT) 25 MG tablet Take 25 mg by mouth 2 (two) times daily.     . metFORMIN (GLUCOPHAGE) 500 MG tablet TAKE 1 TABLET BY MOUTH TWICE A DAY (STOP ONGLYZA)  3  . nitroGLYCERIN (NITROSTAT) 0.4 MG SL tablet PLACE 1 TAB UNDER TONGUE EVERY 5  MIN AS NEEDED FOR CHEST PAIN (UP TO 3 DOSES) 25 tablet 1  . pantoprazole (PROTONIX) 40 MG tablet Take 40 mg by mouth 2 (two) times daily.    . rosuvastatin (CRESTOR) 40 MG tablet TAKE 1 TABLET BY MOUTH EVERY DAY AT NIGHT  3  . telmisartan (MICARDIS) 80 MG tablet Take 80 mg by mouth daily.  3  . triamterene-hydrochlorothiazide (DYAZIDE) 37.5-25 MG capsule      No current facility-administered medications for this visit.    Allergies:  Codeine; Penicillins; and Sulfa antibiotics   Social History: The patient  reports that she quit smoking about 37 years ago. Her smoking use included cigarettes. She has never used smokeless tobacco. She reports that she does not drink alcohol or use drugs.   ROS:  Please see the history of present illness. Otherwise, complete review of systems is positive for arthritic symptoms.  All other systems are reviewed and negative.   Physical Exam: VS:  BP 132/74 (BP Location: Right Arm)   Pulse 76   Ht 5\' 1"  (1.549 m)   Wt 281 lb (127.5 kg)   SpO2 98%   BMI 53.09 kg/m , BMI Body mass index is 53.09 kg/m.  Wt Readings from Last 3 Encounters:  12/16/17 281 lb (127.5 kg)  05/26/17 280 lb (127 kg)  11/18/16 280 lb (127 kg)    General: Morbidly obese woman, appears comfortable at rest. HEENT: Conjunctiva and lids normal, oropharynx clear. Neck: Supple, no elevated JVP or carotid bruits, no thyromegaly. Lungs: Clear to auscultation, nonlabored breathing at rest. Cardiac: Regular rate and rhythm, no S3, soft systolic murmur. Abdomen: Obese with pannus, nontender, bowel sounds present. Extremities: No pitting edema, distal pulses 2+. Skin: Warm and dry. Musculoskeletal: No kyphosis. Neuropsychiatric: Alert and oriented x3, affect grossly appropriate.  ECG: I personally reviewed the tracing from 11/18/2016 which showed sinus rhythm with nonspecific ST-T changes and possible old inferior infarct pattern.  Recent Labwork:  September 2019: Hemoglobin 13.8,  platelets 288, BUN 38, creatinine 1.36, potassium 4.2, AST 20, ALT 25, cholesterol 194, triglycerides 117, HDL 57, LDL 114, hemoglobin A1c 5.9, TSH 1.55  Other Studies Reviewed Today:  Echocardiogram 03/15/2011: Study Conclusions  - Left ventricle: Possible mild posterior hypokinesis. However, even with contrast injection posterior wall difficult to visualize. No other obvious segmental wall motion abnormalities. The cavity size was normal. Wall thickness was normal. Systolic function was normal. The estimated ejection fraction was in the range of 50% to 55%. Left ventricular diastolic function parameters were normal. - Aortic valve: Valve area: 1.88cm^2(VTI). Valve area: 1.88cm^2 (Vmax).  Lexiscan Myoview 02/02/2013: IMPRESSION: 1.Abnormal Lexiscan myocardial perfusion imaging.  2. Large myocardial scar in the apex and mid to distal  anteroseptal wall with no reversible ischemia.  3. Moderate-sized area of ischemia in the lateral wall/Left circumflex territory  4. Abnormal pharmacologic stress EKG for ischemia  5. Normal left ventricular ejection fraction.  6. Overall intermediate risk study for major cardiac events. There is a large area of scar in the apex, a moderate-sized area of myocardium at jeopardy in the lateral wall corresponding to known left circumflex disease.  Assessment and Plan:  1.  Multivessel CAD status post DES intervention to the left main with unsuccessful PCI of the circumflex.  She has actually done well on medical therapy without active angina symptoms and we continue with observation at this time.  She remains on dual antiplatelet therapy.  No reported bleeding problems.  2.  Mixed hyperlipidemia, Crestor increased to 40 mg daily by Dr. [place] [name].  Recent LDL 114.  3.  Essential hypertension, blood pressure control is adequate today.  No changes made to current regimen.  4.  Morbid obesity.  Current medicines were reviewed with  the patient today.   Orders Placed This Encounter  Procedures  . EKG 12-Lead    Disposition: Follow-up in 6 months.  Signed, Satira Sark, MD, [facility] 12/16/2017 11:37 AM    [place] [facility] [place]. [address] [place], [place] [address] Phone: [phone]; Fax: [phone]

## 2017-12-16 ENCOUNTER — Ambulatory Visit: Payer: Medicare HMO | Admitting: Cardiology

## 2017-12-16 ENCOUNTER — Encounter: Payer: Self-pay | Admitting: Cardiology

## 2017-12-16 VITALS — BP 132/74 | HR 76 | Ht 61.0 in | Wt 281.0 lb

## 2017-12-16 DIAGNOSIS — E782 Mixed hyperlipidemia: Secondary | ICD-10-CM | POA: Diagnosis not present

## 2017-12-16 DIAGNOSIS — I10 Essential (primary) hypertension: Secondary | ICD-10-CM | POA: Diagnosis not present

## 2017-12-16 DIAGNOSIS — I25119 Atherosclerotic heart disease of native coronary artery with unspecified angina pectoris: Secondary | ICD-10-CM

## 2017-12-16 NOTE — Patient Instructions (Signed)
Medication Instructions:  Your physician recommends that you continue on your current medications as directed. Please refer to the Current Medication list given to you today.  If you need a refill on your cardiac medications before your next appointment, please call your pharmacy.   Lab work: none If you have labs (blood work) drawn today and your tests are completely normal, you will receive your results only by: . MyChart Message (if you have MyChart) OR . A paper copy in the mail If you have any lab test that is abnormal or we need to change your treatment, we will call you to review the results.  Testing/Procedures: none  Follow-Up: At [facility], you and your health needs are our priority.  As part of our continuing mission to provide you with exceptional heart care, we have created designated Provider Care Teams.  These Care Teams include your primary Cardiologist (physician) and Advanced Practice Providers (APPs -  Physician Assistants and Nurse Practitioners) who all work together to provide you with the care you need, when you need it. You will need a follow up appointment in 6 months.  Please call our office 2 months in advance to schedule this appointment.  You may see Samuel McDowell, MD or one of the following Advanced Practice Providers on your designated Care Team:   Brittany Strader, PA-C ([place] Office) . Michele Lenze, PA-C ([place] Office)  Any Other Special Instructions Will Be Listed Below (If Applicable). NONE   

## 2017-12-30 DIAGNOSIS — R69 Illness, unspecified: Secondary | ICD-10-CM | POA: Diagnosis not present

## 2018-01-03 DIAGNOSIS — R69 Illness, unspecified: Secondary | ICD-10-CM | POA: Diagnosis not present

## 2018-01-22 DIAGNOSIS — R69 Illness, unspecified: Secondary | ICD-10-CM | POA: Diagnosis not present

## 2018-02-14 DIAGNOSIS — R69 Illness, unspecified: Secondary | ICD-10-CM | POA: Diagnosis not present

## 2018-02-16 DIAGNOSIS — E782 Mixed hyperlipidemia: Secondary | ICD-10-CM | POA: Diagnosis not present

## 2018-02-16 DIAGNOSIS — N182 Chronic kidney disease, stage 2 (mild): Secondary | ICD-10-CM | POA: Diagnosis not present

## 2018-02-16 DIAGNOSIS — G894 Chronic pain syndrome: Secondary | ICD-10-CM | POA: Diagnosis not present

## 2018-02-16 DIAGNOSIS — E039 Hypothyroidism, unspecified: Secondary | ICD-10-CM | POA: Diagnosis not present

## 2018-02-16 DIAGNOSIS — I10 Essential (primary) hypertension: Secondary | ICD-10-CM | POA: Diagnosis not present

## 2018-02-16 DIAGNOSIS — I2510 Atherosclerotic heart disease of native coronary artery without angina pectoris: Secondary | ICD-10-CM | POA: Diagnosis not present

## 2018-02-16 DIAGNOSIS — Z6841 Body Mass Index (BMI) 40.0 and over, adult: Secondary | ICD-10-CM | POA: Diagnosis not present

## 2018-02-16 DIAGNOSIS — E1122 Type 2 diabetes mellitus with diabetic chronic kidney disease: Secondary | ICD-10-CM | POA: Diagnosis not present

## 2018-02-16 DIAGNOSIS — H9209 Otalgia, unspecified ear: Secondary | ICD-10-CM | POA: Diagnosis not present

## 2018-02-23 DIAGNOSIS — H81399 Other peripheral vertigo, unspecified ear: Secondary | ICD-10-CM | POA: Diagnosis not present

## 2018-02-23 DIAGNOSIS — G4709 Other insomnia: Secondary | ICD-10-CM | POA: Diagnosis not present

## 2018-02-23 DIAGNOSIS — N183 Chronic kidney disease, stage 3 (moderate): Secondary | ICD-10-CM | POA: Diagnosis not present

## 2018-02-23 DIAGNOSIS — E782 Mixed hyperlipidemia: Secondary | ICD-10-CM | POA: Diagnosis not present

## 2018-02-23 DIAGNOSIS — K219 Gastro-esophageal reflux disease without esophagitis: Secondary | ICD-10-CM | POA: Diagnosis not present

## 2018-02-23 DIAGNOSIS — I2510 Atherosclerotic heart disease of native coronary artery without angina pectoris: Secondary | ICD-10-CM | POA: Diagnosis not present

## 2018-02-23 DIAGNOSIS — E039 Hypothyroidism, unspecified: Secondary | ICD-10-CM | POA: Diagnosis not present

## 2018-02-23 DIAGNOSIS — R809 Proteinuria, unspecified: Secondary | ICD-10-CM | POA: Diagnosis not present

## 2018-02-23 DIAGNOSIS — I10 Essential (primary) hypertension: Secondary | ICD-10-CM | POA: Diagnosis not present

## 2018-02-23 DIAGNOSIS — E1122 Type 2 diabetes mellitus with diabetic chronic kidney disease: Secondary | ICD-10-CM | POA: Diagnosis not present

## 2018-03-09 DIAGNOSIS — R69 Illness, unspecified: Secondary | ICD-10-CM | POA: Diagnosis not present

## 2018-03-11 DIAGNOSIS — R69 Illness, unspecified: Secondary | ICD-10-CM | POA: Diagnosis not present

## 2018-04-07 DIAGNOSIS — R69 Illness, unspecified: Secondary | ICD-10-CM | POA: Diagnosis not present

## 2018-05-04 DIAGNOSIS — R69 Illness, unspecified: Secondary | ICD-10-CM | POA: Diagnosis not present

## 2018-05-05 DIAGNOSIS — R69 Illness, unspecified: Secondary | ICD-10-CM | POA: Diagnosis not present

## 2018-05-11 DIAGNOSIS — Z0000 Encounter for general adult medical examination without abnormal findings: Secondary | ICD-10-CM | POA: Diagnosis not present

## 2018-06-04 DIAGNOSIS — R69 Illness, unspecified: Secondary | ICD-10-CM | POA: Diagnosis not present

## 2018-06-15 ENCOUNTER — Ambulatory Visit: Payer: Medicare HMO | Admitting: Cardiology

## 2018-06-29 DIAGNOSIS — E1122 Type 2 diabetes mellitus with diabetic chronic kidney disease: Secondary | ICD-10-CM | POA: Diagnosis not present

## 2018-06-29 DIAGNOSIS — E039 Hypothyroidism, unspecified: Secondary | ICD-10-CM | POA: Diagnosis not present

## 2018-06-29 DIAGNOSIS — I129 Hypertensive chronic kidney disease with stage 1 through stage 4 chronic kidney disease, or unspecified chronic kidney disease: Secondary | ICD-10-CM | POA: Diagnosis not present

## 2018-06-29 DIAGNOSIS — N183 Chronic kidney disease, stage 3 (moderate): Secondary | ICD-10-CM | POA: Diagnosis not present

## 2018-06-29 DIAGNOSIS — I10 Essential (primary) hypertension: Secondary | ICD-10-CM | POA: Diagnosis not present

## 2018-06-29 DIAGNOSIS — R69 Illness, unspecified: Secondary | ICD-10-CM | POA: Diagnosis not present

## 2018-06-29 DIAGNOSIS — J45909 Unspecified asthma, uncomplicated: Secondary | ICD-10-CM | POA: Diagnosis not present

## 2018-06-29 DIAGNOSIS — K219 Gastro-esophageal reflux disease without esophagitis: Secondary | ICD-10-CM | POA: Diagnosis not present

## 2018-06-29 DIAGNOSIS — E782 Mixed hyperlipidemia: Secondary | ICD-10-CM | POA: Diagnosis not present

## 2018-06-29 DIAGNOSIS — G4709 Other insomnia: Secondary | ICD-10-CM | POA: Diagnosis not present

## 2018-06-29 DIAGNOSIS — I2510 Atherosclerotic heart disease of native coronary artery without angina pectoris: Secondary | ICD-10-CM | POA: Diagnosis not present

## 2018-06-30 DIAGNOSIS — R69 Illness, unspecified: Secondary | ICD-10-CM | POA: Diagnosis not present

## 2018-07-07 DIAGNOSIS — R69 Illness, unspecified: Secondary | ICD-10-CM | POA: Diagnosis not present

## 2018-08-05 DIAGNOSIS — R69 Illness, unspecified: Secondary | ICD-10-CM | POA: Diagnosis not present

## 2018-08-11 ENCOUNTER — Other Ambulatory Visit: Payer: Self-pay | Admitting: Cardiology

## 2018-08-24 ENCOUNTER — Ambulatory Visit: Payer: Medicare HMO | Admitting: Cardiology

## 2018-10-19 DIAGNOSIS — E782 Mixed hyperlipidemia: Secondary | ICD-10-CM | POA: Diagnosis not present

## 2018-10-19 DIAGNOSIS — I2510 Atherosclerotic heart disease of native coronary artery without angina pectoris: Secondary | ICD-10-CM | POA: Diagnosis not present

## 2018-10-19 DIAGNOSIS — N182 Chronic kidney disease, stage 2 (mild): Secondary | ICD-10-CM | POA: Diagnosis not present

## 2018-10-19 DIAGNOSIS — K219 Gastro-esophageal reflux disease without esophagitis: Secondary | ICD-10-CM | POA: Diagnosis not present

## 2018-10-19 DIAGNOSIS — I10 Essential (primary) hypertension: Secondary | ICD-10-CM | POA: Diagnosis not present

## 2018-10-19 DIAGNOSIS — E1122 Type 2 diabetes mellitus with diabetic chronic kidney disease: Secondary | ICD-10-CM | POA: Diagnosis not present

## 2018-10-19 DIAGNOSIS — G894 Chronic pain syndrome: Secondary | ICD-10-CM | POA: Diagnosis not present

## 2018-10-19 DIAGNOSIS — E039 Hypothyroidism, unspecified: Secondary | ICD-10-CM | POA: Diagnosis not present

## 2018-10-26 DIAGNOSIS — N183 Chronic kidney disease, stage 3 (moderate): Secondary | ICD-10-CM | POA: Diagnosis not present

## 2018-10-26 DIAGNOSIS — E039 Hypothyroidism, unspecified: Secondary | ICD-10-CM | POA: Diagnosis not present

## 2018-10-26 DIAGNOSIS — E782 Mixed hyperlipidemia: Secondary | ICD-10-CM | POA: Diagnosis not present

## 2018-10-26 DIAGNOSIS — E1122 Type 2 diabetes mellitus with diabetic chronic kidney disease: Secondary | ICD-10-CM | POA: Diagnosis not present

## 2018-10-26 DIAGNOSIS — I10 Essential (primary) hypertension: Secondary | ICD-10-CM | POA: Diagnosis not present

## 2018-11-02 DIAGNOSIS — I10 Essential (primary) hypertension: Secondary | ICD-10-CM | POA: Diagnosis not present

## 2018-11-02 DIAGNOSIS — I2510 Atherosclerotic heart disease of native coronary artery without angina pectoris: Secondary | ICD-10-CM | POA: Diagnosis not present

## 2018-11-02 DIAGNOSIS — E1122 Type 2 diabetes mellitus with diabetic chronic kidney disease: Secondary | ICD-10-CM | POA: Diagnosis not present

## 2018-11-02 DIAGNOSIS — E782 Mixed hyperlipidemia: Secondary | ICD-10-CM | POA: Diagnosis not present

## 2018-11-02 DIAGNOSIS — E039 Hypothyroidism, unspecified: Secondary | ICD-10-CM | POA: Diagnosis not present

## 2018-11-02 DIAGNOSIS — N183 Chronic kidney disease, stage 3 unspecified: Secondary | ICD-10-CM | POA: Diagnosis not present

## 2018-11-02 DIAGNOSIS — Z23 Encounter for immunization: Secondary | ICD-10-CM | POA: Diagnosis not present

## 2018-11-02 DIAGNOSIS — G4709 Other insomnia: Secondary | ICD-10-CM | POA: Diagnosis not present

## 2018-11-02 DIAGNOSIS — K219 Gastro-esophageal reflux disease without esophagitis: Secondary | ICD-10-CM | POA: Diagnosis not present

## 2018-11-02 DIAGNOSIS — R809 Proteinuria, unspecified: Secondary | ICD-10-CM | POA: Diagnosis not present

## 2018-11-14 ENCOUNTER — Other Ambulatory Visit (HOSPITAL_COMMUNITY): Payer: Self-pay | Admitting: Internal Medicine

## 2018-11-14 ENCOUNTER — Other Ambulatory Visit: Payer: Self-pay | Admitting: Internal Medicine

## 2018-11-14 DIAGNOSIS — I739 Peripheral vascular disease, unspecified: Secondary | ICD-10-CM

## 2018-11-18 ENCOUNTER — Ambulatory Visit (HOSPITAL_COMMUNITY)
Admission: RE | Admit: 2018-11-18 | Discharge: 2018-11-18 | Disposition: A | Discharge status: Home / Self Care | Payer: Medicare HMO | Source: Ambulatory Visit | Attending: Internal Medicine | Admitting: Internal Medicine

## 2018-11-18 ENCOUNTER — Other Ambulatory Visit: Payer: Self-pay

## 2018-11-18 DIAGNOSIS — I739 Peripheral vascular disease, unspecified: Secondary | ICD-10-CM | POA: Diagnosis not present

## 2018-11-18 DIAGNOSIS — G894 Chronic pain syndrome: Secondary | ICD-10-CM | POA: Diagnosis not present

## 2018-11-18 DIAGNOSIS — K219 Gastro-esophageal reflux disease without esophagitis: Secondary | ICD-10-CM | POA: Diagnosis not present

## 2018-11-18 DIAGNOSIS — I10 Essential (primary) hypertension: Secondary | ICD-10-CM | POA: Diagnosis not present

## 2018-11-18 DIAGNOSIS — E1122 Type 2 diabetes mellitus with diabetic chronic kidney disease: Secondary | ICD-10-CM | POA: Diagnosis not present

## 2018-11-18 DIAGNOSIS — M79604 Pain in right leg: Secondary | ICD-10-CM | POA: Diagnosis not present

## 2018-11-18 DIAGNOSIS — M79605 Pain in left leg: Secondary | ICD-10-CM | POA: Diagnosis not present

## 2018-11-18 DIAGNOSIS — E039 Hypothyroidism, unspecified: Secondary | ICD-10-CM | POA: Diagnosis not present

## 2018-11-18 DIAGNOSIS — N182 Chronic kidney disease, stage 2 (mild): Secondary | ICD-10-CM | POA: Diagnosis not present

## 2018-11-18 DIAGNOSIS — I2510 Atherosclerotic heart disease of native coronary artery without angina pectoris: Secondary | ICD-10-CM | POA: Diagnosis not present

## 2018-11-18 DIAGNOSIS — E782 Mixed hyperlipidemia: Secondary | ICD-10-CM | POA: Diagnosis not present

## 2018-11-22 ENCOUNTER — Telehealth: Payer: Self-pay | Admitting: Cardiology

## 2018-11-22 DIAGNOSIS — R69 Illness, unspecified: Secondary | ICD-10-CM | POA: Diagnosis not present

## 2018-11-22 NOTE — Telephone Encounter (Signed)
Virtual Visit Pre-Appointment Phone Call  "(Name), I am calling you today to discuss your upcoming appointment. We are currently trying to limit exposure to the virus that causes COVID-19 by seeing patients at home rather than in the office."  1. "What is the BEST phone number to call the day of the visit?" - include this in appointment notes  2. Do you have or have access to (through a family member/friend) a smartphone with video capability that we can use for your visit?" a. If yes - list this number in appt notes as cell (if different from BEST phone #) and list the appointment type as a VIDEO visit in appointment notes b. If no - list the appointment type as a PHONE visit in appointment notes  Confirm consent - "In the setting of the current Covid19 crisis, you are scheduled for a (phone or video) visit with your provider on (date) at (time).  Just as we do with many in-office visits, in order for you to participate in this visit, we must obtain consent.  If you'd like, I can send this to your mychart (if signed up) or email for you to review.  Otherwise, I can obtain your verbal consent now.  All virtual visits are billed to your insurance company just like a normal visit would be.  By agreeing to a virtual visit, we'd like you to understand that the technology does not allow for your provider to perform an examination, and thus may limit your provider's ability to fully assess your condition. If your provider identifies any concerns that need to be evaluated in person, we will make arrangements to do so.  Finally, though the technology is pretty good, we cannot assure that it will always work on either your or our end, and in the setting of a video visit, we may have to convert it to a phone-only visit.  In either situation, we cannot ensure that we have a secure connection.  Are you willing to proceed?" STAFF: Did the patient verbally acknowledge consent to telehealth visit? Document  YES/NO here: yes 3. Advise patient to be prepared - "Two hours prior to your appointment, go ahead and check your blood pressure, pulse, oxygen saturation, and your weight (if you have the equipment to check those) and write them all down. When your visit starts, your provider will ask you for this information. If you have an Apple Watch or Kardia device, please plan to have heart rate information ready on the day of your appointment. Please have a pen and paper handy nearby the day of the visit as well."  4. Give patient instructions for MyChart download to smartphone OR Doximity/Doxy.me as below if video visit (depending on what platform provider is using)  5. Inform patient they will receive a phone call 15 minutes prior to their appointment time (may be from unknown caller ID) so they should be prepared to answer    TELEPHONE CALL NOTE  [name] has been deemed a candidate for a follow-up tele-health visit to limit community exposure during the Covid-19 pandemic. I spoke with the patient via phone to ensure availability of phone/video source, confirm preferred email & phone number, and discuss instructions and expectations.  I reminded [name] to be prepared with any vital sign and/or heart rhythm information that could potentially be obtained via home monitoring, at the time of her visit. I reminded [name] to expect a phone call prior to her visit.  [name] 11/22/2018 2:59 PM   INSTRUCTIONS FOR DOWNLOADING THE MYCHART APP TO SMARTPHONE  - The patient must first make sure to have activated MyChart and know their login information - If Apple, go to [employer] and type in MyChart in the search bar and download the app. If Android, ask patient to go to [employer] and type in [place] in the search bar and download the app. The app is free but as with any other app downloads, their phone may require them to verify saved payment information or  Apple/Android password.  - The patient will need to then log into the app with their MyChart username and password, and select [facility] as their healthcare provider to link the account. When it is time for your visit, go to the MyChart app, find appointments, and click Begin Video Visit. Be sure to Select Allow for your device to access the Microphone and Camera for your visit. You will then be connected, and your provider will be with you shortly.  **If they have any issues connecting, or need assistance please contact MyChart service desk [phone])**  **If using a computer, in order to ensure the best quality for their visit they will need to use either of the following Internet Browsers: [employer], or Google Chrome**  IF USING DOXIMITY or DOXY.ME - The patient will receive a link just prior to their visit by text.     FULL LENGTH CONSENT FOR TELE-HEALTH VISIT   I hereby voluntarily request, consent and authorize [facility] and its employed or contracted physicians, physician assistants, nurse practitioners or other licensed health care professionals (the Practitioner), to provide me with telemedicine health care services (the Services") as deemed necessary by the treating Practitioner. I acknowledge and consent to receive the Services by the Practitioner via telemedicine. I understand that the telemedicine visit will involve communicating with the Practitioner through live audiovisual communication technology and the disclosure of certain medical information by electronic transmission. I acknowledge that I have been given the opportunity to request an in-person assessment or other available alternative prior to the telemedicine visit and am voluntarily participating in the telemedicine visit.  I understand that I have the right to withhold or withdraw my consent to the use of telemedicine in the course of my care at any time, without affecting my right to future care  or treatment, and that the Practitioner or I may terminate the telemedicine visit at any time. I understand that I have the right to inspect all information obtained and/or recorded in the course of the telemedicine visit and may receive copies of available information for a reasonable fee.  I understand that some of the potential risks of receiving the Services via telemedicine include:   Delay or interruption in medical evaluation due to technological equipment failure or disruption;  Information transmitted may not be sufficient (e.g. poor resolution of images) to allow for appropriate medical decision making by the Practitioner; and/or   In rare instances, security protocols could fail, causing a breach of personal health information.  Furthermore, I acknowledge that it is my responsibility to provide information about my medical history, conditions and care that is complete and accurate to the best of my ability. I acknowledge that Practitioner's advice, recommendations, and/or decision may be based on factors not within their control, such as incomplete or inaccurate data provided by me or distortions of diagnostic images or specimens that may result from electronic transmissions. I understand that the  practice of medicine is not an Chief Strategy Officer and that Practitioner makes no warranties or guarantees regarding treatment outcomes. I acknowledge that I will receive a copy of this consent concurrently upon execution via email to the email address I last provided but may also request a printed copy by calling the office of [facility].    I understand that my insurance will be billed for this visit.   I have read or had this consent read to me.  I understand the contents of this consent, which adequately explains the benefits and risks of the Services being provided via telemedicine.   I have been provided ample opportunity to ask questions regarding this consent and the Services and have had  my questions answered to my satisfaction.  I give my informed consent for the services to be provided through the use of telemedicine in my medical care  By participating in this telemedicine visit I agree to the above.

## 2018-11-27 NOTE — Progress Notes (Signed)
Virtual Visit via Telephone Note   This visit type was conducted due to national recommendations for restrictions regarding the COVID-19 Pandemic (e.g. social distancing) in an effort to limit this patient's exposure and mitigate transmission in our community.  Due to her co-morbid illnesses, this patient is at least at moderate risk for complications without adequate follow up.  This format is felt to be most appropriate for this patient at this time.  The patient did not have access to video technology/had technical difficulties with video requiring transitioning to audio format only (telephone).  All issues noted in this document were discussed and addressed.  No physical exam could be performed with this format.  Please refer to the patient's chart for her  consent to telehealth for [facility].   Date:  11/28/2018   ID:  [name], DOB 19-Jan-1951, MRN [id number]  Patient Location: Home Provider Location: Office  PCP:  [name], MD  Cardiologist:  [name], MD Electrophysiologist:  None   Evaluation Performed:  Follow-Up Visit  Chief Complaint:   Cardiac follow-up  History of Present Illness:    [name] is a 68 y.o. female last seen in November 2019.  We spoke by phone today.  She states that she has been staying home mostly during the pandemic, has not gotten out very much.  Her son lives with her (was laid off from his job).  She does not report any obvious angina symptoms at this time with low level activity.  She has been limited by arthritic pain in her knees.  I reviewed her recent lab work from September as outlined below.  She also had lower extremity arterial Dopplers done recently which were overall reassuring.  I reviewed her medications.  Current cardiac regimen includes aspirin, Norvasc, Coreg, Plavix, Micardis, and Crestor.  The patient does not have symptoms concerning for COVID-19 infection (fever, chills, cough, or new shortness  of breath).    Past Medical History:  Diagnosis Date  . COPD (chronic obstructive pulmonary disease) ([place])   . Coronary atherosclerosis of native coronary artery    Multivessel, DES distal left main, unsuccessful PCI circumflex 2/13, LVEF 50-55%  . Essential hypertension   . Hiatal hernia   . Hypothyroidism   . Mixed hyperlipidemia   . NSTEMI (non-ST elevated myocardial infarction) ([place])   . Ovarian cancer ([place])   . Overweight(278.02)   . Pulmonary embolism (HCC)    Recurrent, previously on Coumadin  . Type 2 diabetes mellitus ([place])    Past Surgical History:  Procedure Laterality Date  . ABDOMINAL SURGERY    . APPENDECTOMY    . BREAST LUMPECTOMY    . CATARACT EXTRACTION W/PHACO  01/29/2011   Procedure: CATARACT EXTRACTION PHACO AND INTRAOCULAR LENS PLACEMENT (IOC);  Surgeon: [name];  Location: [facility];  Service: Ophthalmology;  Laterality: Left;  CDE: 1.81  . CESAREAN SECTION    . CHOLECYSTECTOMY    . Closure of abdominal wound/wound vac    . LEFT HEART CATHETERIZATION WITH CORONARY ANGIOGRAM N/A 03/13/2011   Procedure: LEFT HEART CATHETERIZATION WITH CORONARY ANGIOGRAM;  Surgeon: [name], MD;  [facility] LAB;  Service: Cardiovascular;  Laterality: N/A;  . PERCUTANEOUS CORONARY STENT INTERVENTION (PCI-S) N/A 03/18/2011   Procedure: PERCUTANEOUS CORONARY STENT INTERVENTION (PCI-S);  Surgeon: [name], MD;  Location: [facility] CATH LAB;  Service: Cardiovascular;  Laterality: N/A;  . SMALL INTESTINE SURGERY  12/2007   Dr [name]  . TONSILLECTOMY  Current Meds  Medication Sig  . albuterol (PROVENTIL) (2.5 MG/3ML) 0.083% nebulizer solution Take 2.5 mg by nebulization every 6 (six) hours as needed. For wheezing. Combines with atrovent  . ALPRAZolam (XANAX) 1 MG tablet Take 1 mg by mouth at bedtime as needed for anxiety or sleep.   [name] amLODipine (NORVASC) 10 MG tablet Take 10 mg by mouth daily.  [name] aspirin EC 81 MG tablet Take 81 mg by mouth daily.  [name] BREO ELLIPTA  100-25 MCG/INH AEPB INHALE 1 PUFF INTO THE LUNGS DAILY  . calcium-vitamin D (OSCAL WITH D) 500-200 MG-UNIT per tablet Take 1 tablet by mouth daily.   . carvedilol (COREG) 6.25 MG tablet Take 1 tablet (6.25 mg total) by mouth 2 (two) times daily with a meal.  . clopidogrel (PLAVIX) 75 MG tablet TAKE 1 TABLET BY MOUTH EVERY DAY  . Ferrous Sulfate (IRON) 325 (65 FE) MG TABS Take 1 tablet by mouth daily after breakfast.   . glipiZIDE (GLUCOTROL XL) 5 MG 24 hr tablet Take 5 mg by mouth 2 (two) times daily.  [name] glucose blood test strip 1 each by Other route as needed for other. Use as instructed one touch ultra  . levocetirizine (XYZAL) 5 MG tablet Take 5 mg by mouth daily.  [name] levothyroxine (SYNTHROID, LEVOTHROID) 150 MCG tablet Take 1 tablet (150 mcg total) by mouth every morning.  . meclizine (ANTIVERT) 25 MG tablet Take 25 mg by mouth 2 (two) times daily.   . metFORMIN (GLUCOPHAGE) 500 MG tablet TAKE 1 TABLET BY MOUTH TWICE A DAY (STOP ONGLYZA)  . mupirocin ointment (BACTROBAN) 2 % Apply 1 application topically 4 (four) times daily as needed.  . nitroGLYCERIN (NITROSTAT) 0.4 MG SL tablet PLACE 1 TAB UNDER TONGUE EVERY 5 MIN AS NEEDED FOR CHEST PAIN (UP TO 3 DOSES)  . pantoprazole (PROTONIX) 40 MG tablet Take 40 mg by mouth 2 (two) times daily.  . rosuvastatin (CRESTOR) 40 MG tablet TAKE 1 TABLET BY MOUTH EVERY DAY AT NIGHT  . telmisartan (MICARDIS) 80 MG tablet Take 80 mg by mouth daily.  [name] triamterene-hydrochlorothiazide (DYAZIDE) 37.5-25 MG capsule Take 1 capsule by mouth daily.      Allergies:   Codeine, Penicillins, and Sulfa antibiotics   Social History   Tobacco Use  . Smoking status: Former Smoker    Types: Cigarettes    Quit date: 01/27/1980    Years since quitting: 38.8  . Smokeless tobacco: Never Used  Substance Use Topics  . Alcohol use: No    Alcohol/week: 0.0 standard drinks  . Drug use: No     Family Hx: The patient's family history includes Cirrhosis in her sister;  Coronary artery disease in her father and mother; Fibromyalgia in her sister; Heart attack in her father and mother; Osteoporosis in her sister.  ROS:   Please see the history of present illness. All other systems reviewed and are negative.   Prior CV studies:   The following studies were reviewed today:  Echocardiogram 03/15/2011: Study Conclusions  - Left ventricle: Possible mild posterior hypokinesis. However, even with contrast injection posterior wall difficult to visualize. No other obvious segmental wall motion abnormalities. The cavity size was normal. Wall thickness was normal. Systolic function was normal. The estimated ejection fraction was in the range of 50% to 55%. Left ventricular diastolic function parameters were normal. - Aortic valve: Valve area: 1.88cm^2(VTI). Valve area: 1.88cm^2 (Vmax).  Lexiscan Myoview 02/02/2013: IMPRESSION: 1.Abnormal Lexiscan myocardial perfusion imaging.  2. Large myocardial scar in  the apex and mid to distal anteroseptal wall with no reversible ischemia.  3. Moderate-sized area of ischemia in the lateral wall/Left circumflex territory  4. Abnormal pharmacologic stress EKG for ischemia  5. Normal left ventricular ejection fraction.  6. Overall intermediate risk study for major cardiac events. There is a large area of scar in the apex, a moderate-sized area of myocardium at jeopardy in the lateral wall corresponding to known left circumflex disease.  Lower arterial Dopplers 11/18/2018: FINDINGS: Right ABI:  1.10  Left ABI:  0.93  Right Lower Extremity:  Biphasic arterial waveforms at the ankle.  Left Lower Extremity: Normal triphasic arterial waveforms at the ankle.  1.0-1.4 Normal  IMPRESSION: No significant arterial occlusive disease identified in the lower extremities. There is mild depression of the left ankle-brachial index with normal distal waveforms.  Labs/Other Tests and Data  Reviewed:    EKG:  An ECG dated 12/16/2017 was personally reviewed today and demonstrated:  Normal sinus rhythm.  Recent Labs:  September 2020: Hemoglobin 13.9, platelets 263, BUN 33, creatinine 1.47, potassium 4.5, AST 19, ALT 20, cholesterol 165, triglycerides 138, HDL 53, LDL 88, hemoglobin A1c 6%, TSH 1.21  Wt Readings from Last 3 Encounters:  11/28/18 284 lb (128.8 kg)  12/16/17 281 lb (127.5 kg)  05/26/17 280 lb (127 kg)     Objective:    Vital Signs:  BP 130/74   Pulse 72   Ht 5\' 1"  (1.549 m)   Wt 284 lb (128.8 kg)   BMI 53.66 kg/m    Patient spoke in full sentences, not short of breath. No audible wheezing or coughing. Speech pattern normal.  ASSESSMENT & PLAN:    1.  CAD status post DES to the left main with unsuccessful intervention to the circumflex.  She continues on medical therapy without active angina at current level of activity, limited by arthritic knee pain.  Cardiac regimen is well-rounded, no changes made today.  2.  Mixed hyperlipidemia, she continues on high-dose Crestor, no intolerances.  LDL 88, down from 114.  3.  Essential hypertension, systolic [id number] today.  No change to current regimen.  4.  Morbid obesity.  She has struggled to lose weight over time.  Also affected by sedentary status.  COVID-19 Education: The signs and symptoms of COVID-19 were discussed with the patient and how to seek care for testing (follow up with PCP or arrange E-visit).  The importance of social distancing was discussed today.  Time:   Today, I have spent 7 minutes with the patient with telehealth technology discussing the above problems.      Medication Adjustments/Labs and Tests Ordered: Current medicines are reviewed at length with the patient today.  Concerns regarding medicines are outlined above.   Tests Ordered: No orders of the defined types were placed in this encounter.   Medication Changes: No orders of the defined types were placed in this encounter.    Follow Up:  In Person 1 year in the [place] office.  Signed, [name], MD  11/28/2018 9:15 AM    [place]

## 2018-11-28 ENCOUNTER — Telehealth (INDEPENDENT_AMBULATORY_CARE_PROVIDER_SITE_OTHER): Payer: Medicare HMO | Admitting: Cardiology

## 2018-11-28 ENCOUNTER — Encounter: Payer: Self-pay | Admitting: Cardiology

## 2018-11-28 VITALS — BP 130/74 | HR 72 | Ht 61.0 in | Wt 284.0 lb

## 2018-11-28 DIAGNOSIS — E782 Mixed hyperlipidemia: Secondary | ICD-10-CM | POA: Diagnosis not present

## 2018-11-28 DIAGNOSIS — I25119 Atherosclerotic heart disease of native coronary artery with unspecified angina pectoris: Secondary | ICD-10-CM

## 2018-11-28 DIAGNOSIS — I10 Essential (primary) hypertension: Secondary | ICD-10-CM | POA: Diagnosis not present

## 2018-11-28 NOTE — Patient Instructions (Addendum)
Medication Instructions:   Your physician recommends that you continue on your current medications as directed. Please refer to the Current Medication list given to you today.  Labwork:  NONE  Testing/Procedures:  NONE  Follow-Up:  Your physician recommends that you schedule a follow-up appointment in: 1 year at the [place] office. You will receive a reminder letter in the mail in about 10 months reminding you to call and schedule your appointment. If you don't receive this letter, please contact our office.  Any Other Special Instructions Will Be Listed Below (If Applicable).  If you need a refill on your cardiac medications before your next appointment, please call your pharmacy.

## 2018-11-30 ENCOUNTER — Ambulatory Visit: Payer: Medicare HMO | Admitting: Cardiology

## 2018-12-15 DIAGNOSIS — E782 Mixed hyperlipidemia: Secondary | ICD-10-CM | POA: Diagnosis not present

## 2018-12-15 DIAGNOSIS — K219 Gastro-esophageal reflux disease without esophagitis: Secondary | ICD-10-CM | POA: Diagnosis not present

## 2018-12-15 DIAGNOSIS — E1122 Type 2 diabetes mellitus with diabetic chronic kidney disease: Secondary | ICD-10-CM | POA: Diagnosis not present

## 2018-12-15 DIAGNOSIS — E039 Hypothyroidism, unspecified: Secondary | ICD-10-CM | POA: Diagnosis not present

## 2018-12-15 DIAGNOSIS — I10 Essential (primary) hypertension: Secondary | ICD-10-CM | POA: Diagnosis not present

## 2018-12-15 DIAGNOSIS — G894 Chronic pain syndrome: Secondary | ICD-10-CM | POA: Diagnosis not present

## 2018-12-15 DIAGNOSIS — I2510 Atherosclerotic heart disease of native coronary artery without angina pectoris: Secondary | ICD-10-CM | POA: Diagnosis not present

## 2018-12-15 DIAGNOSIS — N182 Chronic kidney disease, stage 2 (mild): Secondary | ICD-10-CM | POA: Diagnosis not present

## 2018-12-30 ENCOUNTER — Other Ambulatory Visit: Payer: Self-pay | Admitting: Cardiology

## 2019-01-06 ENCOUNTER — Telehealth: Payer: Self-pay | Admitting: Cardiology

## 2019-01-06 NOTE — Telephone Encounter (Signed)
Yes, she can hold her aspirin.

## 2019-01-06 NOTE — Telephone Encounter (Signed)
Will forward to Dr. McDowell to advise.  

## 2019-01-06 NOTE — Telephone Encounter (Signed)
Pt made aware, she voiced understanding.  

## 2019-01-06 NOTE — Telephone Encounter (Signed)
Pt has been having nose bleeds this week and would like to know if she can go off her Asprin for a few days.   States she had one last night and it took her over an hr last night to get it stopped.   [phone]

## 2019-01-17 DIAGNOSIS — N182 Chronic kidney disease, stage 2 (mild): Secondary | ICD-10-CM | POA: Diagnosis not present

## 2019-01-17 DIAGNOSIS — K219 Gastro-esophageal reflux disease without esophagitis: Secondary | ICD-10-CM | POA: Diagnosis not present

## 2019-01-17 DIAGNOSIS — I129 Hypertensive chronic kidney disease with stage 1 through stage 4 chronic kidney disease, or unspecified chronic kidney disease: Secondary | ICD-10-CM | POA: Diagnosis not present

## 2019-01-17 DIAGNOSIS — E039 Hypothyroidism, unspecified: Secondary | ICD-10-CM | POA: Diagnosis not present

## 2019-01-17 DIAGNOSIS — E1122 Type 2 diabetes mellitus with diabetic chronic kidney disease: Secondary | ICD-10-CM | POA: Diagnosis not present

## 2019-01-17 DIAGNOSIS — I2510 Atherosclerotic heart disease of native coronary artery without angina pectoris: Secondary | ICD-10-CM | POA: Diagnosis not present

## 2019-01-17 DIAGNOSIS — E782 Mixed hyperlipidemia: Secondary | ICD-10-CM | POA: Diagnosis not present

## 2019-01-17 DIAGNOSIS — I10 Essential (primary) hypertension: Secondary | ICD-10-CM | POA: Diagnosis not present

## 2019-01-17 DIAGNOSIS — G894 Chronic pain syndrome: Secondary | ICD-10-CM | POA: Diagnosis not present

## 2019-02-14 DIAGNOSIS — E782 Mixed hyperlipidemia: Secondary | ICD-10-CM | POA: Diagnosis not present

## 2019-02-14 DIAGNOSIS — G894 Chronic pain syndrome: Secondary | ICD-10-CM | POA: Diagnosis not present

## 2019-02-14 DIAGNOSIS — I10 Essential (primary) hypertension: Secondary | ICD-10-CM | POA: Diagnosis not present

## 2019-02-14 DIAGNOSIS — E1122 Type 2 diabetes mellitus with diabetic chronic kidney disease: Secondary | ICD-10-CM | POA: Diagnosis not present

## 2019-02-14 DIAGNOSIS — K219 Gastro-esophageal reflux disease without esophagitis: Secondary | ICD-10-CM | POA: Diagnosis not present

## 2019-02-14 DIAGNOSIS — I2510 Atherosclerotic heart disease of native coronary artery without angina pectoris: Secondary | ICD-10-CM | POA: Diagnosis not present

## 2019-02-14 DIAGNOSIS — N182 Chronic kidney disease, stage 2 (mild): Secondary | ICD-10-CM | POA: Diagnosis not present

## 2019-02-14 DIAGNOSIS — I129 Hypertensive chronic kidney disease with stage 1 through stage 4 chronic kidney disease, or unspecified chronic kidney disease: Secondary | ICD-10-CM | POA: Diagnosis not present

## 2019-02-14 DIAGNOSIS — E039 Hypothyroidism, unspecified: Secondary | ICD-10-CM | POA: Diagnosis not present

## 2019-02-17 ENCOUNTER — Other Ambulatory Visit: Payer: Self-pay | Admitting: Cardiology

## 2019-03-12 ENCOUNTER — Ambulatory Visit: Payer: Medicare HMO

## 2019-03-23 DIAGNOSIS — K219 Gastro-esophageal reflux disease without esophagitis: Secondary | ICD-10-CM | POA: Diagnosis not present

## 2019-03-23 DIAGNOSIS — I10 Essential (primary) hypertension: Secondary | ICD-10-CM | POA: Diagnosis not present

## 2019-03-23 DIAGNOSIS — E1122 Type 2 diabetes mellitus with diabetic chronic kidney disease: Secondary | ICD-10-CM | POA: Diagnosis not present

## 2019-03-23 DIAGNOSIS — E039 Hypothyroidism, unspecified: Secondary | ICD-10-CM | POA: Diagnosis not present

## 2019-03-23 DIAGNOSIS — G894 Chronic pain syndrome: Secondary | ICD-10-CM | POA: Diagnosis not present

## 2019-03-23 DIAGNOSIS — E782 Mixed hyperlipidemia: Secondary | ICD-10-CM | POA: Diagnosis not present

## 2019-03-23 DIAGNOSIS — I129 Hypertensive chronic kidney disease with stage 1 through stage 4 chronic kidney disease, or unspecified chronic kidney disease: Secondary | ICD-10-CM | POA: Diagnosis not present

## 2019-03-23 DIAGNOSIS — I2510 Atherosclerotic heart disease of native coronary artery without angina pectoris: Secondary | ICD-10-CM | POA: Diagnosis not present

## 2019-03-23 DIAGNOSIS — N182 Chronic kidney disease, stage 2 (mild): Secondary | ICD-10-CM | POA: Diagnosis not present

## 2019-04-05 DIAGNOSIS — R69 Illness, unspecified: Secondary | ICD-10-CM | POA: Diagnosis not present

## 2019-04-07 DIAGNOSIS — K279 Peptic ulcer, site unspecified, unspecified as acute or chronic, without hemorrhage or perforation: Secondary | ICD-10-CM | POA: Diagnosis not present

## 2019-04-07 DIAGNOSIS — K259 Gastric ulcer, unspecified as acute or chronic, without hemorrhage or perforation: Secondary | ICD-10-CM | POA: Diagnosis not present

## 2019-04-07 DIAGNOSIS — H81399 Other peripheral vertigo, unspecified ear: Secondary | ICD-10-CM | POA: Diagnosis not present

## 2019-04-07 DIAGNOSIS — R809 Proteinuria, unspecified: Secondary | ICD-10-CM | POA: Diagnosis not present

## 2019-04-07 DIAGNOSIS — M79606 Pain in leg, unspecified: Secondary | ICD-10-CM | POA: Diagnosis not present

## 2019-04-07 DIAGNOSIS — R69 Illness, unspecified: Secondary | ICD-10-CM | POA: Diagnosis not present

## 2019-04-07 DIAGNOSIS — G4700 Insomnia, unspecified: Secondary | ICD-10-CM | POA: Diagnosis not present

## 2019-04-07 DIAGNOSIS — Z0000 Encounter for general adult medical examination without abnormal findings: Secondary | ICD-10-CM | POA: Diagnosis not present

## 2019-04-07 DIAGNOSIS — I129 Hypertensive chronic kidney disease with stage 1 through stage 4 chronic kidney disease, or unspecified chronic kidney disease: Secondary | ICD-10-CM | POA: Diagnosis not present

## 2019-04-07 DIAGNOSIS — N183 Chronic kidney disease, stage 3 unspecified: Secondary | ICD-10-CM | POA: Diagnosis not present

## 2019-04-07 DIAGNOSIS — Z23 Encounter for immunization: Secondary | ICD-10-CM | POA: Diagnosis not present

## 2019-04-14 DIAGNOSIS — I129 Hypertensive chronic kidney disease with stage 1 through stage 4 chronic kidney disease, or unspecified chronic kidney disease: Secondary | ICD-10-CM | POA: Diagnosis not present

## 2019-04-14 DIAGNOSIS — I2510 Atherosclerotic heart disease of native coronary artery without angina pectoris: Secondary | ICD-10-CM | POA: Diagnosis not present

## 2019-04-14 DIAGNOSIS — Z7409 Other reduced mobility: Secondary | ICD-10-CM | POA: Diagnosis not present

## 2019-04-14 DIAGNOSIS — E782 Mixed hyperlipidemia: Secondary | ICD-10-CM | POA: Diagnosis not present

## 2019-04-14 DIAGNOSIS — M545 Low back pain: Secondary | ICD-10-CM | POA: Diagnosis not present

## 2019-04-14 DIAGNOSIS — K219 Gastro-esophageal reflux disease without esophagitis: Secondary | ICD-10-CM | POA: Diagnosis not present

## 2019-04-14 DIAGNOSIS — G4709 Other insomnia: Secondary | ICD-10-CM | POA: Diagnosis not present

## 2019-04-14 DIAGNOSIS — I10 Essential (primary) hypertension: Secondary | ICD-10-CM | POA: Diagnosis not present

## 2019-04-14 DIAGNOSIS — J4520 Mild intermittent asthma, uncomplicated: Secondary | ICD-10-CM | POA: Diagnosis not present

## 2019-04-14 DIAGNOSIS — J309 Allergic rhinitis, unspecified: Secondary | ICD-10-CM | POA: Diagnosis not present

## 2019-04-14 DIAGNOSIS — N182 Chronic kidney disease, stage 2 (mild): Secondary | ICD-10-CM | POA: Diagnosis not present

## 2019-04-14 DIAGNOSIS — G894 Chronic pain syndrome: Secondary | ICD-10-CM | POA: Diagnosis not present

## 2019-04-14 DIAGNOSIS — Z0001 Encounter for general adult medical examination with abnormal findings: Secondary | ICD-10-CM | POA: Diagnosis not present

## 2019-04-14 DIAGNOSIS — E039 Hypothyroidism, unspecified: Secondary | ICD-10-CM | POA: Diagnosis not present

## 2019-04-14 DIAGNOSIS — E1122 Type 2 diabetes mellitus with diabetic chronic kidney disease: Secondary | ICD-10-CM | POA: Diagnosis not present

## 2019-04-25 DIAGNOSIS — G894 Chronic pain syndrome: Secondary | ICD-10-CM | POA: Diagnosis not present

## 2019-04-25 DIAGNOSIS — K219 Gastro-esophageal reflux disease without esophagitis: Secondary | ICD-10-CM | POA: Diagnosis not present

## 2019-04-25 DIAGNOSIS — I2510 Atherosclerotic heart disease of native coronary artery without angina pectoris: Secondary | ICD-10-CM | POA: Diagnosis not present

## 2019-04-25 DIAGNOSIS — E039 Hypothyroidism, unspecified: Secondary | ICD-10-CM | POA: Diagnosis not present

## 2019-04-25 DIAGNOSIS — E1122 Type 2 diabetes mellitus with diabetic chronic kidney disease: Secondary | ICD-10-CM | POA: Diagnosis not present

## 2019-04-25 DIAGNOSIS — I10 Essential (primary) hypertension: Secondary | ICD-10-CM | POA: Diagnosis not present

## 2019-04-25 DIAGNOSIS — E782 Mixed hyperlipidemia: Secondary | ICD-10-CM | POA: Diagnosis not present

## 2019-04-25 DIAGNOSIS — I129 Hypertensive chronic kidney disease with stage 1 through stage 4 chronic kidney disease, or unspecified chronic kidney disease: Secondary | ICD-10-CM | POA: Diagnosis not present

## 2019-04-25 DIAGNOSIS — N182 Chronic kidney disease, stage 2 (mild): Secondary | ICD-10-CM | POA: Diagnosis not present

## 2019-05-10 DIAGNOSIS — Z1211 Encounter for screening for malignant neoplasm of colon: Secondary | ICD-10-CM | POA: Diagnosis not present

## 2019-05-11 DIAGNOSIS — E1122 Type 2 diabetes mellitus with diabetic chronic kidney disease: Secondary | ICD-10-CM | POA: Diagnosis not present

## 2019-05-11 DIAGNOSIS — I10 Essential (primary) hypertension: Secondary | ICD-10-CM | POA: Diagnosis not present

## 2019-05-11 DIAGNOSIS — E782 Mixed hyperlipidemia: Secondary | ICD-10-CM | POA: Diagnosis not present

## 2019-05-11 DIAGNOSIS — E039 Hypothyroidism, unspecified: Secondary | ICD-10-CM | POA: Diagnosis not present

## 2019-05-11 DIAGNOSIS — I2510 Atherosclerotic heart disease of native coronary artery without angina pectoris: Secondary | ICD-10-CM | POA: Diagnosis not present

## 2019-05-11 DIAGNOSIS — N182 Chronic kidney disease, stage 2 (mild): Secondary | ICD-10-CM | POA: Diagnosis not present

## 2019-05-11 DIAGNOSIS — G894 Chronic pain syndrome: Secondary | ICD-10-CM | POA: Diagnosis not present

## 2019-05-11 DIAGNOSIS — I129 Hypertensive chronic kidney disease with stage 1 through stage 4 chronic kidney disease, or unspecified chronic kidney disease: Secondary | ICD-10-CM | POA: Diagnosis not present

## 2019-05-11 DIAGNOSIS — K219 Gastro-esophageal reflux disease without esophagitis: Secondary | ICD-10-CM | POA: Diagnosis not present

## 2019-05-22 DIAGNOSIS — R69 Illness, unspecified: Secondary | ICD-10-CM | POA: Diagnosis not present

## 2019-05-30 DIAGNOSIS — G894 Chronic pain syndrome: Secondary | ICD-10-CM | POA: Diagnosis not present

## 2019-05-30 DIAGNOSIS — N182 Chronic kidney disease, stage 2 (mild): Secondary | ICD-10-CM | POA: Diagnosis not present

## 2019-05-30 DIAGNOSIS — E782 Mixed hyperlipidemia: Secondary | ICD-10-CM | POA: Diagnosis not present

## 2019-05-30 DIAGNOSIS — E1122 Type 2 diabetes mellitus with diabetic chronic kidney disease: Secondary | ICD-10-CM | POA: Diagnosis not present

## 2019-05-30 DIAGNOSIS — I10 Essential (primary) hypertension: Secondary | ICD-10-CM | POA: Diagnosis not present

## 2019-05-30 DIAGNOSIS — I129 Hypertensive chronic kidney disease with stage 1 through stage 4 chronic kidney disease, or unspecified chronic kidney disease: Secondary | ICD-10-CM | POA: Diagnosis not present

## 2019-05-30 DIAGNOSIS — I2510 Atherosclerotic heart disease of native coronary artery without angina pectoris: Secondary | ICD-10-CM | POA: Diagnosis not present

## 2019-05-30 DIAGNOSIS — E039 Hypothyroidism, unspecified: Secondary | ICD-10-CM | POA: Diagnosis not present

## 2019-05-30 DIAGNOSIS — K219 Gastro-esophageal reflux disease without esophagitis: Secondary | ICD-10-CM | POA: Diagnosis not present

## 2019-06-14 DIAGNOSIS — Z7409 Other reduced mobility: Secondary | ICD-10-CM | POA: Diagnosis not present

## 2019-06-14 DIAGNOSIS — R296 Repeated falls: Secondary | ICD-10-CM | POA: Diagnosis not present

## 2019-07-15 DIAGNOSIS — R296 Repeated falls: Secondary | ICD-10-CM | POA: Diagnosis not present

## 2019-07-15 DIAGNOSIS — Z7409 Other reduced mobility: Secondary | ICD-10-CM | POA: Diagnosis not present

## 2019-08-08 DIAGNOSIS — R69 Illness, unspecified: Secondary | ICD-10-CM | POA: Diagnosis not present

## 2019-08-09 DIAGNOSIS — I2510 Atherosclerotic heart disease of native coronary artery without angina pectoris: Secondary | ICD-10-CM | POA: Diagnosis not present

## 2019-08-09 DIAGNOSIS — E039 Hypothyroidism, unspecified: Secondary | ICD-10-CM | POA: Diagnosis not present

## 2019-08-09 DIAGNOSIS — I10 Essential (primary) hypertension: Secondary | ICD-10-CM | POA: Diagnosis not present

## 2019-08-09 DIAGNOSIS — I129 Hypertensive chronic kidney disease with stage 1 through stage 4 chronic kidney disease, or unspecified chronic kidney disease: Secondary | ICD-10-CM | POA: Diagnosis not present

## 2019-08-09 DIAGNOSIS — E1122 Type 2 diabetes mellitus with diabetic chronic kidney disease: Secondary | ICD-10-CM | POA: Diagnosis not present

## 2019-08-09 DIAGNOSIS — K219 Gastro-esophageal reflux disease without esophagitis: Secondary | ICD-10-CM | POA: Diagnosis not present

## 2019-08-09 DIAGNOSIS — G894 Chronic pain syndrome: Secondary | ICD-10-CM | POA: Diagnosis not present

## 2019-08-09 DIAGNOSIS — E782 Mixed hyperlipidemia: Secondary | ICD-10-CM | POA: Diagnosis not present

## 2019-08-09 DIAGNOSIS — N182 Chronic kidney disease, stage 2 (mild): Secondary | ICD-10-CM | POA: Diagnosis not present

## 2019-08-14 DIAGNOSIS — Z7409 Other reduced mobility: Secondary | ICD-10-CM | POA: Diagnosis not present

## 2019-08-14 DIAGNOSIS — R296 Repeated falls: Secondary | ICD-10-CM | POA: Diagnosis not present

## 2019-08-18 DIAGNOSIS — Z8521 Personal history of malignant neoplasm of larynx: Secondary | ICD-10-CM | POA: Diagnosis not present

## 2019-08-18 DIAGNOSIS — N183 Chronic kidney disease, stage 3 unspecified: Secondary | ICD-10-CM | POA: Diagnosis not present

## 2019-08-18 DIAGNOSIS — M79606 Pain in leg, unspecified: Secondary | ICD-10-CM | POA: Diagnosis not present

## 2019-08-18 DIAGNOSIS — Z23 Encounter for immunization: Secondary | ICD-10-CM | POA: Diagnosis not present

## 2019-08-18 DIAGNOSIS — R809 Proteinuria, unspecified: Secondary | ICD-10-CM | POA: Diagnosis not present

## 2019-08-18 DIAGNOSIS — K279 Peptic ulcer, site unspecified, unspecified as acute or chronic, without hemorrhage or perforation: Secondary | ICD-10-CM | POA: Diagnosis not present

## 2019-08-18 DIAGNOSIS — I129 Hypertensive chronic kidney disease with stage 1 through stage 4 chronic kidney disease, or unspecified chronic kidney disease: Secondary | ICD-10-CM | POA: Diagnosis not present

## 2019-08-18 DIAGNOSIS — Z0001 Encounter for general adult medical examination with abnormal findings: Secondary | ICD-10-CM | POA: Diagnosis not present

## 2019-08-18 DIAGNOSIS — G4700 Insomnia, unspecified: Secondary | ICD-10-CM | POA: Diagnosis not present

## 2019-08-18 DIAGNOSIS — Z0000 Encounter for general adult medical examination without abnormal findings: Secondary | ICD-10-CM | POA: Diagnosis not present

## 2019-08-21 ENCOUNTER — Other Ambulatory Visit: Payer: Self-pay | Admitting: Cardiology

## 2019-08-25 DIAGNOSIS — E782 Mixed hyperlipidemia: Secondary | ICD-10-CM | POA: Diagnosis not present

## 2019-08-25 DIAGNOSIS — J4520 Mild intermittent asthma, uncomplicated: Secondary | ICD-10-CM | POA: Diagnosis not present

## 2019-08-25 DIAGNOSIS — I129 Hypertensive chronic kidney disease with stage 1 through stage 4 chronic kidney disease, or unspecified chronic kidney disease: Secondary | ICD-10-CM | POA: Diagnosis not present

## 2019-08-25 DIAGNOSIS — M545 Low back pain: Secondary | ICD-10-CM | POA: Diagnosis not present

## 2019-08-25 DIAGNOSIS — J309 Allergic rhinitis, unspecified: Secondary | ICD-10-CM | POA: Diagnosis not present

## 2019-08-25 DIAGNOSIS — G4709 Other insomnia: Secondary | ICD-10-CM | POA: Diagnosis not present

## 2019-08-25 DIAGNOSIS — I2510 Atherosclerotic heart disease of native coronary artery without angina pectoris: Secondary | ICD-10-CM | POA: Diagnosis not present

## 2019-08-25 DIAGNOSIS — E039 Hypothyroidism, unspecified: Secondary | ICD-10-CM | POA: Diagnosis not present

## 2019-08-25 DIAGNOSIS — H8113 Benign paroxysmal vertigo, bilateral: Secondary | ICD-10-CM | POA: Diagnosis not present

## 2019-08-25 DIAGNOSIS — K219 Gastro-esophageal reflux disease without esophagitis: Secondary | ICD-10-CM | POA: Diagnosis not present

## 2019-09-14 DIAGNOSIS — Z7409 Other reduced mobility: Secondary | ICD-10-CM | POA: Diagnosis not present

## 2019-09-14 DIAGNOSIS — R296 Repeated falls: Secondary | ICD-10-CM | POA: Diagnosis not present

## 2019-10-05 DIAGNOSIS — K219 Gastro-esophageal reflux disease without esophagitis: Secondary | ICD-10-CM | POA: Diagnosis not present

## 2019-10-05 DIAGNOSIS — E782 Mixed hyperlipidemia: Secondary | ICD-10-CM | POA: Diagnosis not present

## 2019-10-05 DIAGNOSIS — E1122 Type 2 diabetes mellitus with diabetic chronic kidney disease: Secondary | ICD-10-CM | POA: Diagnosis not present

## 2019-10-15 DIAGNOSIS — Z7409 Other reduced mobility: Secondary | ICD-10-CM | POA: Diagnosis not present

## 2019-10-15 DIAGNOSIS — R296 Repeated falls: Secondary | ICD-10-CM | POA: Diagnosis not present

## 2019-10-19 DIAGNOSIS — M810 Age-related osteoporosis without current pathological fracture: Secondary | ICD-10-CM | POA: Diagnosis not present

## 2019-10-19 DIAGNOSIS — E1122 Type 2 diabetes mellitus with diabetic chronic kidney disease: Secondary | ICD-10-CM | POA: Diagnosis not present

## 2019-10-19 DIAGNOSIS — E785 Hyperlipidemia, unspecified: Secondary | ICD-10-CM | POA: Diagnosis not present

## 2019-11-02 DIAGNOSIS — R69 Illness, unspecified: Secondary | ICD-10-CM | POA: Diagnosis not present

## 2019-11-03 DIAGNOSIS — K219 Gastro-esophageal reflux disease without esophagitis: Secondary | ICD-10-CM | POA: Diagnosis not present

## 2019-11-03 DIAGNOSIS — E782 Mixed hyperlipidemia: Secondary | ICD-10-CM | POA: Diagnosis not present

## 2019-11-03 DIAGNOSIS — I2510 Atherosclerotic heart disease of native coronary artery without angina pectoris: Secondary | ICD-10-CM | POA: Diagnosis not present

## 2019-11-03 DIAGNOSIS — I129 Hypertensive chronic kidney disease with stage 1 through stage 4 chronic kidney disease, or unspecified chronic kidney disease: Secondary | ICD-10-CM | POA: Diagnosis not present

## 2019-11-03 DIAGNOSIS — E1122 Type 2 diabetes mellitus with diabetic chronic kidney disease: Secondary | ICD-10-CM | POA: Diagnosis not present

## 2019-11-03 DIAGNOSIS — G894 Chronic pain syndrome: Secondary | ICD-10-CM | POA: Diagnosis not present

## 2019-11-03 DIAGNOSIS — E039 Hypothyroidism, unspecified: Secondary | ICD-10-CM | POA: Diagnosis not present

## 2019-11-03 DIAGNOSIS — I10 Essential (primary) hypertension: Secondary | ICD-10-CM | POA: Diagnosis not present

## 2019-11-03 DIAGNOSIS — N182 Chronic kidney disease, stage 2 (mild): Secondary | ICD-10-CM | POA: Diagnosis not present

## 2019-11-14 DIAGNOSIS — Z7409 Other reduced mobility: Secondary | ICD-10-CM | POA: Diagnosis not present

## 2019-11-14 DIAGNOSIS — R296 Repeated falls: Secondary | ICD-10-CM | POA: Diagnosis not present

## 2019-11-16 ENCOUNTER — Telehealth: Payer: Self-pay | Admitting: Cardiology

## 2019-11-16 NOTE — Telephone Encounter (Signed)
  Patient Consent for Virtual Visit         [name] has provided verbal consent on 11/16/2019 for a virtual visit (video or telephone).   CONSENT FOR VIRTUAL VISIT FOR:  [name]  By participating in this virtual visit I agree to the following:  I hereby voluntarily request, consent and authorize [facility] and its employed or contracted physicians, physician assistants, nurse practitioners or other licensed health care professionals (the Practitioner), to provide me with telemedicine health care services (the "Services") as deemed necessary by the treating Practitioner. I acknowledge and consent to receive the Services by the Practitioner via telemedicine. I understand that the telemedicine visit will involve communicating with the Practitioner through live audiovisual communication technology and the disclosure of certain medical information by electronic transmission. I acknowledge that I have been given the opportunity to request an in-person assessment or other available alternative prior to the telemedicine visit and am voluntarily participating in the telemedicine visit.  I understand that I have the right to withhold or withdraw my consent to the use of telemedicine in the course of my care at any time, without affecting my right to future care or treatment, and that the Practitioner or I may terminate the telemedicine visit at any time. I understand that I have the right to inspect all information obtained and/or recorded in the course of the telemedicine visit and may receive copies of available information for a reasonable fee.  I understand that some of the potential risks of receiving the Services via telemedicine include:  [name] Delay or interruption in medical evaluation due to technological equipment failure or disruption; . Information transmitted may not be sufficient (e.g. poor resolution of images) to allow for appropriate medical decision making by the  Practitioner; and/or  . In rare instances, security protocols could fail, causing a breach of personal health information.  Furthermore, I acknowledge that it is my responsibility to provide information about my medical history, conditions and care that is complete and accurate to the best of my ability. I acknowledge that Practitioner's advice, recommendations, and/or decision may be based on factors not within their control, such as incomplete or inaccurate data provided by me or distortions of diagnostic images or specimens that may result from electronic transmissions. I understand that the practice of medicine is not an exact science and that Practitioner makes no warranties or guarantees regarding treatment outcomes. I acknowledge that a copy of this consent can be made available to me via my patient portal ([place]), or I can request a printed copy by calling the office of [place].    I understand that my insurance will be billed for this visit.   I have read or had this consent read to me. . I understand the contents of this consent, which adequately explains the benefits and risks of the Services being provided via telemedicine.  . I have been provided ample opportunity to ask questions regarding this consent and the Services and have had my questions answered to my satisfaction. . I give my informed consent for the services to be provided through the use of telemedicine in my medical care

## 2019-11-30 NOTE — Progress Notes (Signed)
Virtual Visit via Telephone Note   This visit type was conducted due to national recommendations for restrictions regarding the COVID-19 Pandemic (e.g. social distancing) in an effort to limit this patient's exposure and mitigate transmission in our community.  Due to her co-morbid illnesses, this patient is at least at moderate risk for complications without adequate follow up.  This format is felt to be most appropriate for this patient at this time.  The patient did not have access to video technology/had technical difficulties with video requiring transitioning to audio format only (telephone).  All issues noted in this document were discussed and addressed.  No physical exam could be performed with this format.  Please refer to the patient's chart for her  consent to telehealth for [facility].    Date:  12/01/2019   ID:  [name], DOB 1950-07-05, MRN [id number] The patient was identified using 2 identifiers.  Patient Location: Home Provider Location: Home Office  PCP:  [name], MD  Cardiologist:  [name], MD Electrophysiologist:  None   Evaluation Performed:  Follow-Up Visit  Chief Complaint:  Cardiac follow-up  History of Present Illness:    [name] is a 69 y.o. female last assessed via telehealth encounter in November 2020.  We spoke by phone today.  She tells me that she has had trouble ambulating due to leg pain and stiffness, has been told by Dr. [name] that she has arthritis and also has trouble with varicose veins and swelling.  ABIs from last October were reassuring as noted below.  She reports having recent lab work with Dr. [name], we are requesting the results for review.  Her blood pressure was up today, she tells me that this is not usually the case but she does have a visit scheduled to see Dr. [name] later this month.  I reviewed her medications which are outlined below and stable from a cardiac perspective.  She does not report any  recent nitroglycerin use.   Past Medical History:  Diagnosis Date  . COPD (chronic obstructive pulmonary disease) ([place])   . Coronary atherosclerosis of native coronary artery    Multivessel, DES distal left main, unsuccessful PCI circumflex 2/13, LVEF 50-55%  . Essential hypertension   . Hiatal hernia   . Hypothyroidism   . Mixed hyperlipidemia   . NSTEMI (non-ST elevated myocardial infarction) ([place])   . Ovarian cancer ([place])   . Overweight(278.02)   . Pulmonary embolism (H[facility])    Recurrent, previously on Coumadin  . Type 2 diabetes mellitus ([place])    Past Surgical History:  Procedure Laterality Date  . ABDOMINAL SURGERY    . APPENDECTOMY    . BREAST LUMPECTOMY    . CATARACT EXTRACTION W/PHACO  01/29/2011   Procedure: CATARACT EXTRACTION PHACO AND INTRAOCULAR LENS PLACEMENT (IOC);  Surgeon: [name];  Location: [facility];  Service: Ophthalmology;  Laterality: Left;  CDE: 1.81  . CESAREAN SECTION    . CHOLECYSTECTOMY    . Closure of abdominal wound/wound vac    . LEFT HEART CATHETERIZATION WITH CORONARY ANGIOGRAM N/A 03/13/2011   Procedure: LEFT HEART CATHETERIZATION WITH CORONARY ANGIOGRAM;  Surgeon: [name], MD;  Location: [facility] CATH LAB;  Service: Cardiovascular;  Laterality: N/A;  . PERCUTANEOUS CORONARY STENT INTERVENTION (PCI-S) N/A 03/18/2011   Procedure: PERCUTANEOUS CORONARY STENT INTERVENTION (PCI-S);  Surgeon: [name], MD;  Location: [facility] CATH LAB;  Service: Cardiovascular;  Laterality: N/A;  . SMALL INTESTINE SURGERY  12/2007   Dr [name]  .  TONSILLECTOMY       Current Meds  Medication Sig  . albuterol (PROVENTIL) (2.5 MG/3ML) 0.083% nebulizer solution Take 2.5 mg by nebulization every 6 (six) hours as needed. For wheezing. Combines with atrovent  . ALPRAZolam (XANAX) 1 MG tablet Take 1 mg by mouth at bedtime as needed for anxiety or sleep.   [name] amLODipine (NORVASC) 10 MG tablet Take 10 mg by mouth daily.  [name] aspirin EC 81 MG tablet Take 81 mg by mouth daily.  [name]  BREO ELLIPTA 100-25 MCG/INH AEPB INHALE 1 PUFF INTO THE LUNGS DAILY  . calcium-vitamin D (OSCAL WITH D) 500-200 MG-UNIT per tablet Take 1 tablet by mouth daily.   . carvedilol (COREG) 6.25 MG tablet TAKE 1 TABLET (6.25 MG TOTAL) BY MOUTH 2 (TWO) TIMES DAILY WITH A MEAL.  [name] clopidogrel (PLAVIX) 75 MG tablet TAKE 1 TABLET BY MOUTH EVERY DAY  . Ferrous Sulfate (IRON) 325 (65 FE) MG TABS Take 1 tablet by mouth daily after breakfast.   . glipiZIDE (GLUCOTROL XL) 5 MG 24 hr tablet Take 5 mg by mouth 2 (two) times daily.  [name] glucose blood test strip 1 each by Other route as needed for other. Use as instructed one touch ultra  . levocetirizine (XYZAL) 5 MG tablet Take 5 mg by mouth daily.  [name] levothyroxine (SYNTHROID, LEVOTHROID) 150 MCG tablet Take 1 tablet (150 mcg total) by mouth every morning.  . meclizine (ANTIVERT) 25 MG tablet Take 25 mg by mouth 2 (two) times daily.   . metFORMIN (GLUCOPHAGE) 500 MG tablet TAKE 1 TABLET BY MOUTH TWICE A DAY (STOP ONGLYZA)  . mupirocin ointment (BACTROBAN) 2 % Apply 1 application topically 4 (four) times daily as needed.  . nitroGLYCERIN (NITROSTAT) 0.4 MG SL tablet PLACE 1 TAB UNDER TONGUE EVERY 5 MIN AS NEEDED FOR CHEST PAIN (UP TO 3 DOSES)  . pantoprazole (PROTONIX) 40 MG tablet Take 40 mg by mouth 2 (two) times daily.  . rosuvastatin (CRESTOR) 40 MG tablet TAKE 1 TABLET BY MOUTH EVERY DAY AT NIGHT  . telmisartan (MICARDIS) 80 MG tablet Take 80 mg by mouth daily.  . traMADol (ULTRAM) 50 MG tablet Take 50 mg by mouth 2 (two) times daily as needed.  . triamterene-hydrochlorothiazide (DYAZIDE) 37.5-25 MG capsule Take 1 capsule by mouth daily.      Allergies:   Codeine, Penicillins, and Sulfa antibiotics   ROS: No palpitations or syncope.  Prior CV studies:   The following studies were reviewed today:  Echocardiogram 03/15/2011: Study Conclusions  - Left ventricle: Possible mild posterior hypokinesis. However, even with contrast injection posterior  wall difficult to visualize. No other obvious segmental wall motion abnormalities. The cavity size was normal. Wall thickness was normal. Systolic function was normal. The estimated ejection fraction was in the range of 50% to 55%. Left ventricular diastolic function parameters were normal. - Aortic valve: Valve area: 1.88cm^2(VTI). Valve area: 1.88cm^2 (Vmax).  Lexiscan Myoview 02/02/2013: IMPRESSION: 1.Abnormal Lexiscan myocardial perfusion imaging.  2. Large myocardial scar in the apex and mid to distal anteroseptal wall with no reversible ischemia.  3. Moderate-sized area of ischemia in the lateral wall/Left circumflex territory  4. Abnormal pharmacologic stress EKG for ischemia  5. Normal left ventricular ejection fraction.  6. Overall intermediate risk study for major cardiac events. There is a large area of scar in the apex, a moderate-sized area of myocardium at jeopardy in the lateral wall corresponding to known left circumflex disease.  Lower arterial Dopplers 11/18/2018: FINDINGS: Right ABI:  1.10  Left ABI: 0.93  Right Lower Extremity: Biphasic arterial waveforms at the ankle.  Left Lower Extremity: Normal triphasic arterial waveforms at the ankle.  1.0-1.4 Normal  IMPRESSION: No significant arterial occlusive disease identified in the lower extremities. There is mild depression of the left ankle-brachial index with normal distal waveforms.  Labs/Other Tests and Data Reviewed:    EKG:  An ECG dated 12/16/2017 was personally reviewed today and demonstrated:  Normal sinus rhythm.  Recent Labs:  September 2020: Hemoglobin 13.9, platelets 263, BUN 33, creatinine 1.47, potassium 4.5, AST 19, ALT 20, cholesterol 165, triglycerides 138, HDL 53, LDL 88, hemoglobin A1c 6%, TSH 1.21  Wt Readings from Last 3 Encounters:  12/01/19 278 lb (126.1 kg)  11/28/18 284 lb (128.8 kg)  12/16/17 281 lb (127.5 kg)     Objective:    Vital  Signs:  BP (!) 163/95   Pulse 77   Ht 5\' 1"  (1.549 m)   Wt 278 lb (126.1 kg)   BMI 52.53 kg/m    Patient spoke in complete sentences, not audibly short of breath.  ASSESSMENT & PLAN:    1.  CAD status post DES to the left main with unsuccessful intervention to the circumflex.  She has done well without progressive angina on medical therapy and we continue with observation at this point.  She is on aspirin, Plavix, Coreg, Norvasc, Micardis, Dyazide, Crestor, and as needed nitroglycerin.  2.  Essential hypertension, blood pressure elevated today.  She tells me that this is not usually the case when she checks it.  She has pending follow-up with Dr. [name].  Requesting interval lab work for review as well.  I did not make any changes today.  3.  Mixed hyperlipidemia, continues on Crestor.  Requesting interval lab work from Dr. [name].  Time:   Today, I have spent 5 minutes with the patient with telehealth technology discussing the above problems.     Medication Adjustments/Labs and Tests Ordered: Current medicines are reviewed at length with the patient today.  Concerns regarding medicines are outlined above.   Tests Ordered: No orders of the defined types were placed in this encounter.   Medication Changes: No orders of the defined types were placed in this encounter.   Follow Up:  In Person 6 months in the [place] office.  Signed, [name], MD  12/01/2019 9:06 AM    [place]

## 2019-12-01 ENCOUNTER — Telehealth (INDEPENDENT_AMBULATORY_CARE_PROVIDER_SITE_OTHER): Payer: Medicare HMO | Admitting: Cardiology

## 2019-12-01 ENCOUNTER — Encounter: Payer: Self-pay | Admitting: Cardiology

## 2019-12-01 VITALS — BP 163/95 | HR 77 | Ht 61.0 in | Wt 278.0 lb

## 2019-12-01 DIAGNOSIS — I25119 Atherosclerotic heart disease of native coronary artery with unspecified angina pectoris: Secondary | ICD-10-CM

## 2019-12-01 DIAGNOSIS — I10 Essential (primary) hypertension: Secondary | ICD-10-CM

## 2019-12-01 DIAGNOSIS — E782 Mixed hyperlipidemia: Secondary | ICD-10-CM | POA: Diagnosis not present

## 2019-12-01 NOTE — Patient Instructions (Signed)
Medication Instructions:  Your physician recommends that you continue on your current medications as directed. Please refer to the Current Medication list given to you today. *If you need a refill on your cardiac medications before your next appointment, please call your pharmacy*   Lab Work: None today If you have labs (blood work) drawn today and your tests are completely normal, you will receive your results only by:  [place] (if you have MyChart) OR  A paper copy in the mail If you have any lab test that is abnormal or we need to change your treatment, we will call you to review the results.   Testing/Procedures: None today   Follow-Up: At [facility], you and your health needs are our priority.  As part of our continuing mission to provide you with exceptional heart care, we have created designated Provider Care Teams.  These Care Teams include your primary Cardiologist (physician) and Advanced Practice Providers (APPs -  Physician Assistants and Nurse Practitioners) who all work together to provide you with the care you need, when you need it.  We recommend signing up for the patient portal called "MyChart".  Sign up information is provided on this After Visit Summary.  MyChart is used to connect with patients for Virtual Visits (Telemedicine).  Patients are able to view lab/test results, encounter notes, upcoming appointments, etc.  Non-urgent messages can be sent to your provider as well.   To learn more about what you can do with MyChart, go to [email or web address].    Your next appointment:   6 month(s)  The format for your next appointment:   In Person  Provider:   Bernerd Pho, PA-C   Other Instructions None today       Thank you for choosing [facility] !

## 2019-12-04 ENCOUNTER — Encounter: Payer: Self-pay | Admitting: Cardiology

## 2019-12-13 DIAGNOSIS — M79606 Pain in leg, unspecified: Secondary | ICD-10-CM | POA: Diagnosis not present

## 2019-12-13 DIAGNOSIS — Z0000 Encounter for general adult medical examination without abnormal findings: Secondary | ICD-10-CM | POA: Diagnosis not present

## 2019-12-13 DIAGNOSIS — R809 Proteinuria, unspecified: Secondary | ICD-10-CM | POA: Diagnosis not present

## 2019-12-13 DIAGNOSIS — Z0001 Encounter for general adult medical examination with abnormal findings: Secondary | ICD-10-CM | POA: Diagnosis not present

## 2019-12-13 DIAGNOSIS — G4700 Insomnia, unspecified: Secondary | ICD-10-CM | POA: Diagnosis not present

## 2019-12-13 DIAGNOSIS — K279 Peptic ulcer, site unspecified, unspecified as acute or chronic, without hemorrhage or perforation: Secondary | ICD-10-CM | POA: Diagnosis not present

## 2019-12-13 DIAGNOSIS — E1122 Type 2 diabetes mellitus with diabetic chronic kidney disease: Secondary | ICD-10-CM | POA: Diagnosis not present

## 2019-12-13 DIAGNOSIS — I10 Essential (primary) hypertension: Secondary | ICD-10-CM | POA: Diagnosis not present

## 2019-12-13 DIAGNOSIS — N183 Chronic kidney disease, stage 3 unspecified: Secondary | ICD-10-CM | POA: Diagnosis not present

## 2019-12-13 DIAGNOSIS — N1832 Chronic kidney disease, stage 3b: Secondary | ICD-10-CM | POA: Diagnosis not present

## 2019-12-13 DIAGNOSIS — Z23 Encounter for immunization: Secondary | ICD-10-CM | POA: Diagnosis not present

## 2019-12-13 DIAGNOSIS — I129 Hypertensive chronic kidney disease with stage 1 through stage 4 chronic kidney disease, or unspecified chronic kidney disease: Secondary | ICD-10-CM | POA: Diagnosis not present

## 2019-12-13 DIAGNOSIS — E559 Vitamin D deficiency, unspecified: Secondary | ICD-10-CM | POA: Diagnosis not present

## 2019-12-15 DIAGNOSIS — Z7409 Other reduced mobility: Secondary | ICD-10-CM | POA: Diagnosis not present

## 2019-12-15 DIAGNOSIS — R296 Repeated falls: Secondary | ICD-10-CM | POA: Diagnosis not present

## 2019-12-18 DIAGNOSIS — Z8543 Personal history of malignant neoplasm of ovary: Secondary | ICD-10-CM | POA: Diagnosis not present

## 2019-12-18 DIAGNOSIS — I2510 Atherosclerotic heart disease of native coronary artery without angina pectoris: Secondary | ICD-10-CM | POA: Diagnosis not present

## 2019-12-18 DIAGNOSIS — H8113 Benign paroxysmal vertigo, bilateral: Secondary | ICD-10-CM | POA: Diagnosis not present

## 2019-12-18 DIAGNOSIS — G4709 Other insomnia: Secondary | ICD-10-CM | POA: Diagnosis not present

## 2019-12-18 DIAGNOSIS — E782 Mixed hyperlipidemia: Secondary | ICD-10-CM | POA: Diagnosis not present

## 2019-12-18 DIAGNOSIS — J4520 Mild intermittent asthma, uncomplicated: Secondary | ICD-10-CM | POA: Diagnosis not present

## 2019-12-18 DIAGNOSIS — K219 Gastro-esophageal reflux disease without esophagitis: Secondary | ICD-10-CM | POA: Diagnosis not present

## 2019-12-18 DIAGNOSIS — E039 Hypothyroidism, unspecified: Secondary | ICD-10-CM | POA: Diagnosis not present

## 2019-12-18 DIAGNOSIS — I129 Hypertensive chronic kidney disease with stage 1 through stage 4 chronic kidney disease, or unspecified chronic kidney disease: Secondary | ICD-10-CM | POA: Diagnosis not present

## 2019-12-18 DIAGNOSIS — J309 Allergic rhinitis, unspecified: Secondary | ICD-10-CM | POA: Diagnosis not present

## 2019-12-19 DIAGNOSIS — J309 Allergic rhinitis, unspecified: Secondary | ICD-10-CM | POA: Diagnosis not present

## 2019-12-19 DIAGNOSIS — N1832 Chronic kidney disease, stage 3b: Secondary | ICD-10-CM | POA: Diagnosis not present

## 2019-12-19 DIAGNOSIS — I2510 Atherosclerotic heart disease of native coronary artery without angina pectoris: Secondary | ICD-10-CM | POA: Diagnosis not present

## 2019-12-19 DIAGNOSIS — I129 Hypertensive chronic kidney disease with stage 1 through stage 4 chronic kidney disease, or unspecified chronic kidney disease: Secondary | ICD-10-CM | POA: Diagnosis not present

## 2019-12-19 DIAGNOSIS — E782 Mixed hyperlipidemia: Secondary | ICD-10-CM | POA: Diagnosis not present

## 2019-12-19 DIAGNOSIS — J4520 Mild intermittent asthma, uncomplicated: Secondary | ICD-10-CM | POA: Diagnosis not present

## 2019-12-19 DIAGNOSIS — E039 Hypothyroidism, unspecified: Secondary | ICD-10-CM | POA: Diagnosis not present

## 2019-12-19 DIAGNOSIS — H8113 Benign paroxysmal vertigo, bilateral: Secondary | ICD-10-CM | POA: Diagnosis not present

## 2019-12-19 DIAGNOSIS — K219 Gastro-esophageal reflux disease without esophagitis: Secondary | ICD-10-CM | POA: Diagnosis not present

## 2019-12-19 DIAGNOSIS — G4709 Other insomnia: Secondary | ICD-10-CM | POA: Diagnosis not present

## 2019-12-28 DIAGNOSIS — I129 Hypertensive chronic kidney disease with stage 1 through stage 4 chronic kidney disease, or unspecified chronic kidney disease: Secondary | ICD-10-CM | POA: Diagnosis not present

## 2019-12-28 DIAGNOSIS — N1832 Chronic kidney disease, stage 3b: Secondary | ICD-10-CM | POA: Diagnosis not present

## 2019-12-28 DIAGNOSIS — G8929 Other chronic pain: Secondary | ICD-10-CM | POA: Diagnosis not present

## 2019-12-28 DIAGNOSIS — R262 Difficulty in walking, not elsewhere classified: Secondary | ICD-10-CM | POA: Diagnosis not present

## 2019-12-28 DIAGNOSIS — H8113 Benign paroxysmal vertigo, bilateral: Secondary | ICD-10-CM | POA: Diagnosis not present

## 2019-12-28 DIAGNOSIS — J45909 Unspecified asthma, uncomplicated: Secondary | ICD-10-CM | POA: Diagnosis not present

## 2019-12-28 DIAGNOSIS — I83811 Varicose veins of right lower extremities with pain: Secondary | ICD-10-CM | POA: Diagnosis not present

## 2019-12-28 DIAGNOSIS — E1122 Type 2 diabetes mellitus with diabetic chronic kidney disease: Secondary | ICD-10-CM | POA: Diagnosis not present

## 2019-12-28 DIAGNOSIS — I2510 Atherosclerotic heart disease of native coronary artery without angina pectoris: Secondary | ICD-10-CM | POA: Diagnosis not present

## 2020-01-03 DIAGNOSIS — I2510 Atherosclerotic heart disease of native coronary artery without angina pectoris: Secondary | ICD-10-CM | POA: Diagnosis not present

## 2020-01-03 DIAGNOSIS — R262 Difficulty in walking, not elsewhere classified: Secondary | ICD-10-CM | POA: Diagnosis not present

## 2020-01-03 DIAGNOSIS — G8929 Other chronic pain: Secondary | ICD-10-CM | POA: Diagnosis not present

## 2020-01-03 DIAGNOSIS — I129 Hypertensive chronic kidney disease with stage 1 through stage 4 chronic kidney disease, or unspecified chronic kidney disease: Secondary | ICD-10-CM | POA: Diagnosis not present

## 2020-01-03 DIAGNOSIS — N1832 Chronic kidney disease, stage 3b: Secondary | ICD-10-CM | POA: Diagnosis not present

## 2020-01-03 DIAGNOSIS — J45909 Unspecified asthma, uncomplicated: Secondary | ICD-10-CM | POA: Diagnosis not present

## 2020-01-03 DIAGNOSIS — E1122 Type 2 diabetes mellitus with diabetic chronic kidney disease: Secondary | ICD-10-CM | POA: Diagnosis not present

## 2020-01-03 DIAGNOSIS — H8113 Benign paroxysmal vertigo, bilateral: Secondary | ICD-10-CM | POA: Diagnosis not present

## 2020-01-03 DIAGNOSIS — I83811 Varicose veins of right lower extremities with pain: Secondary | ICD-10-CM | POA: Diagnosis not present

## 2020-01-05 DIAGNOSIS — E1122 Type 2 diabetes mellitus with diabetic chronic kidney disease: Secondary | ICD-10-CM | POA: Diagnosis not present

## 2020-01-05 DIAGNOSIS — J45909 Unspecified asthma, uncomplicated: Secondary | ICD-10-CM | POA: Diagnosis not present

## 2020-01-05 DIAGNOSIS — H8113 Benign paroxysmal vertigo, bilateral: Secondary | ICD-10-CM | POA: Diagnosis not present

## 2020-01-05 DIAGNOSIS — N1832 Chronic kidney disease, stage 3b: Secondary | ICD-10-CM | POA: Diagnosis not present

## 2020-01-05 DIAGNOSIS — G8929 Other chronic pain: Secondary | ICD-10-CM | POA: Diagnosis not present

## 2020-01-05 DIAGNOSIS — I2510 Atherosclerotic heart disease of native coronary artery without angina pectoris: Secondary | ICD-10-CM | POA: Diagnosis not present

## 2020-01-05 DIAGNOSIS — I129 Hypertensive chronic kidney disease with stage 1 through stage 4 chronic kidney disease, or unspecified chronic kidney disease: Secondary | ICD-10-CM | POA: Diagnosis not present

## 2020-01-05 DIAGNOSIS — R262 Difficulty in walking, not elsewhere classified: Secondary | ICD-10-CM | POA: Diagnosis not present

## 2020-01-05 DIAGNOSIS — I83811 Varicose veins of right lower extremities with pain: Secondary | ICD-10-CM | POA: Diagnosis not present

## 2020-01-10 DIAGNOSIS — I2510 Atherosclerotic heart disease of native coronary artery without angina pectoris: Secondary | ICD-10-CM | POA: Diagnosis not present

## 2020-01-10 DIAGNOSIS — I83811 Varicose veins of right lower extremities with pain: Secondary | ICD-10-CM | POA: Diagnosis not present

## 2020-01-10 DIAGNOSIS — E1122 Type 2 diabetes mellitus with diabetic chronic kidney disease: Secondary | ICD-10-CM | POA: Diagnosis not present

## 2020-01-10 DIAGNOSIS — R262 Difficulty in walking, not elsewhere classified: Secondary | ICD-10-CM | POA: Diagnosis not present

## 2020-01-10 DIAGNOSIS — N1832 Chronic kidney disease, stage 3b: Secondary | ICD-10-CM | POA: Diagnosis not present

## 2020-01-10 DIAGNOSIS — H8113 Benign paroxysmal vertigo, bilateral: Secondary | ICD-10-CM | POA: Diagnosis not present

## 2020-01-10 DIAGNOSIS — J45909 Unspecified asthma, uncomplicated: Secondary | ICD-10-CM | POA: Diagnosis not present

## 2020-01-10 DIAGNOSIS — G8929 Other chronic pain: Secondary | ICD-10-CM | POA: Diagnosis not present

## 2020-01-10 DIAGNOSIS — I129 Hypertensive chronic kidney disease with stage 1 through stage 4 chronic kidney disease, or unspecified chronic kidney disease: Secondary | ICD-10-CM | POA: Diagnosis not present

## 2020-01-12 DIAGNOSIS — H8113 Benign paroxysmal vertigo, bilateral: Secondary | ICD-10-CM | POA: Diagnosis not present

## 2020-01-12 DIAGNOSIS — R262 Difficulty in walking, not elsewhere classified: Secondary | ICD-10-CM | POA: Diagnosis not present

## 2020-01-12 DIAGNOSIS — I129 Hypertensive chronic kidney disease with stage 1 through stage 4 chronic kidney disease, or unspecified chronic kidney disease: Secondary | ICD-10-CM | POA: Diagnosis not present

## 2020-01-12 DIAGNOSIS — G8929 Other chronic pain: Secondary | ICD-10-CM | POA: Diagnosis not present

## 2020-01-12 DIAGNOSIS — N1832 Chronic kidney disease, stage 3b: Secondary | ICD-10-CM | POA: Diagnosis not present

## 2020-01-12 DIAGNOSIS — I83811 Varicose veins of right lower extremities with pain: Secondary | ICD-10-CM | POA: Diagnosis not present

## 2020-01-12 DIAGNOSIS — I2510 Atherosclerotic heart disease of native coronary artery without angina pectoris: Secondary | ICD-10-CM | POA: Diagnosis not present

## 2020-01-12 DIAGNOSIS — E1122 Type 2 diabetes mellitus with diabetic chronic kidney disease: Secondary | ICD-10-CM | POA: Diagnosis not present

## 2020-01-12 DIAGNOSIS — J45909 Unspecified asthma, uncomplicated: Secondary | ICD-10-CM | POA: Diagnosis not present

## 2020-01-14 DIAGNOSIS — R296 Repeated falls: Secondary | ICD-10-CM | POA: Diagnosis not present

## 2020-01-14 DIAGNOSIS — Z7409 Other reduced mobility: Secondary | ICD-10-CM | POA: Diagnosis not present

## 2020-01-26 DIAGNOSIS — K219 Gastro-esophageal reflux disease without esophagitis: Secondary | ICD-10-CM | POA: Diagnosis not present

## 2020-01-26 DIAGNOSIS — J309 Allergic rhinitis, unspecified: Secondary | ICD-10-CM | POA: Diagnosis not present

## 2020-01-26 DIAGNOSIS — E782 Mixed hyperlipidemia: Secondary | ICD-10-CM | POA: Diagnosis not present

## 2020-01-26 DIAGNOSIS — H8113 Benign paroxysmal vertigo, bilateral: Secondary | ICD-10-CM | POA: Diagnosis not present

## 2020-01-26 DIAGNOSIS — J4520 Mild intermittent asthma, uncomplicated: Secondary | ICD-10-CM | POA: Diagnosis not present

## 2020-01-26 DIAGNOSIS — I2510 Atherosclerotic heart disease of native coronary artery without angina pectoris: Secondary | ICD-10-CM | POA: Diagnosis not present

## 2020-01-26 DIAGNOSIS — G4709 Other insomnia: Secondary | ICD-10-CM | POA: Diagnosis not present

## 2020-01-26 DIAGNOSIS — I129 Hypertensive chronic kidney disease with stage 1 through stage 4 chronic kidney disease, or unspecified chronic kidney disease: Secondary | ICD-10-CM | POA: Diagnosis not present

## 2020-01-26 DIAGNOSIS — E039 Hypothyroidism, unspecified: Secondary | ICD-10-CM | POA: Diagnosis not present

## 2020-01-26 DIAGNOSIS — Z8543 Personal history of malignant neoplasm of ovary: Secondary | ICD-10-CM | POA: Diagnosis not present

## 2020-02-14 DIAGNOSIS — R296 Repeated falls: Secondary | ICD-10-CM | POA: Diagnosis not present

## 2020-02-14 DIAGNOSIS — Z7409 Other reduced mobility: Secondary | ICD-10-CM | POA: Diagnosis not present

## 2020-02-17 ENCOUNTER — Other Ambulatory Visit: Payer: Self-pay | Admitting: Cardiology

## 2020-02-24 DIAGNOSIS — I10 Essential (primary) hypertension: Secondary | ICD-10-CM | POA: Diagnosis not present

## 2020-02-24 DIAGNOSIS — G894 Chronic pain syndrome: Secondary | ICD-10-CM | POA: Diagnosis not present

## 2020-02-24 DIAGNOSIS — K219 Gastro-esophageal reflux disease without esophagitis: Secondary | ICD-10-CM | POA: Diagnosis not present

## 2020-02-24 DIAGNOSIS — E039 Hypothyroidism, unspecified: Secondary | ICD-10-CM | POA: Diagnosis not present

## 2020-02-24 DIAGNOSIS — E782 Mixed hyperlipidemia: Secondary | ICD-10-CM | POA: Diagnosis not present

## 2020-02-24 DIAGNOSIS — E1122 Type 2 diabetes mellitus with diabetic chronic kidney disease: Secondary | ICD-10-CM | POA: Diagnosis not present

## 2020-02-24 DIAGNOSIS — N182 Chronic kidney disease, stage 2 (mild): Secondary | ICD-10-CM | POA: Diagnosis not present

## 2020-02-24 DIAGNOSIS — I2510 Atherosclerotic heart disease of native coronary artery without angina pectoris: Secondary | ICD-10-CM | POA: Diagnosis not present

## 2020-02-29 DIAGNOSIS — Z7409 Other reduced mobility: Secondary | ICD-10-CM | POA: Diagnosis not present

## 2020-02-29 DIAGNOSIS — M545 Low back pain, unspecified: Secondary | ICD-10-CM | POA: Diagnosis not present

## 2020-03-16 DIAGNOSIS — Z7409 Other reduced mobility: Secondary | ICD-10-CM | POA: Diagnosis not present

## 2020-03-16 DIAGNOSIS — R296 Repeated falls: Secondary | ICD-10-CM | POA: Diagnosis not present

## 2020-03-25 DIAGNOSIS — Z8543 Personal history of malignant neoplasm of ovary: Secondary | ICD-10-CM | POA: Diagnosis not present

## 2020-03-25 DIAGNOSIS — E039 Hypothyroidism, unspecified: Secondary | ICD-10-CM | POA: Diagnosis not present

## 2020-03-25 DIAGNOSIS — H8113 Benign paroxysmal vertigo, bilateral: Secondary | ICD-10-CM | POA: Diagnosis not present

## 2020-03-25 DIAGNOSIS — Z86718 Personal history of other venous thrombosis and embolism: Secondary | ICD-10-CM | POA: Diagnosis not present

## 2020-03-25 DIAGNOSIS — I2510 Atherosclerotic heart disease of native coronary artery without angina pectoris: Secondary | ICD-10-CM | POA: Diagnosis not present

## 2020-03-25 DIAGNOSIS — E782 Mixed hyperlipidemia: Secondary | ICD-10-CM | POA: Diagnosis not present

## 2020-03-25 DIAGNOSIS — K219 Gastro-esophageal reflux disease without esophagitis: Secondary | ICD-10-CM | POA: Diagnosis not present

## 2020-03-25 DIAGNOSIS — J4520 Mild intermittent asthma, uncomplicated: Secondary | ICD-10-CM | POA: Diagnosis not present

## 2020-03-25 DIAGNOSIS — J309 Allergic rhinitis, unspecified: Secondary | ICD-10-CM | POA: Diagnosis not present

## 2020-03-25 DIAGNOSIS — N1832 Chronic kidney disease, stage 3b: Secondary | ICD-10-CM | POA: Diagnosis not present

## 2020-04-13 DIAGNOSIS — Z7409 Other reduced mobility: Secondary | ICD-10-CM | POA: Diagnosis not present

## 2020-04-13 DIAGNOSIS — R296 Repeated falls: Secondary | ICD-10-CM | POA: Diagnosis not present

## 2020-04-22 DIAGNOSIS — H5210 Myopia, unspecified eye: Secondary | ICD-10-CM | POA: Diagnosis not present

## 2020-04-22 DIAGNOSIS — Z0100 Encounter for examination of eyes and vision without abnormal findings: Secondary | ICD-10-CM | POA: Diagnosis not present

## 2020-04-24 DIAGNOSIS — Z8543 Personal history of malignant neoplasm of ovary: Secondary | ICD-10-CM | POA: Diagnosis not present

## 2020-04-24 DIAGNOSIS — Z86718 Personal history of other venous thrombosis and embolism: Secondary | ICD-10-CM | POA: Diagnosis not present

## 2020-04-24 DIAGNOSIS — Z23 Encounter for immunization: Secondary | ICD-10-CM | POA: Diagnosis not present

## 2020-04-24 DIAGNOSIS — Z0000 Encounter for general adult medical examination without abnormal findings: Secondary | ICD-10-CM | POA: Diagnosis not present

## 2020-04-24 DIAGNOSIS — J309 Allergic rhinitis, unspecified: Secondary | ICD-10-CM | POA: Diagnosis not present

## 2020-04-24 DIAGNOSIS — I129 Hypertensive chronic kidney disease with stage 1 through stage 4 chronic kidney disease, or unspecified chronic kidney disease: Secondary | ICD-10-CM | POA: Diagnosis not present

## 2020-04-24 DIAGNOSIS — M79606 Pain in leg, unspecified: Secondary | ICD-10-CM | POA: Diagnosis not present

## 2020-04-24 DIAGNOSIS — G4700 Insomnia, unspecified: Secondary | ICD-10-CM | POA: Diagnosis not present

## 2020-04-24 DIAGNOSIS — E782 Mixed hyperlipidemia: Secondary | ICD-10-CM | POA: Diagnosis not present

## 2020-04-24 DIAGNOSIS — K219 Gastro-esophageal reflux disease without esophagitis: Secondary | ICD-10-CM | POA: Diagnosis not present

## 2020-04-24 DIAGNOSIS — N183 Chronic kidney disease, stage 3 unspecified: Secondary | ICD-10-CM | POA: Diagnosis not present

## 2020-04-24 DIAGNOSIS — I2510 Atherosclerotic heart disease of native coronary artery without angina pectoris: Secondary | ICD-10-CM | POA: Diagnosis not present

## 2020-04-24 DIAGNOSIS — Z862 Personal history of diseases of the blood and blood-forming organs and certain disorders involving the immune mechanism: Secondary | ICD-10-CM | POA: Diagnosis not present

## 2020-04-24 DIAGNOSIS — K279 Peptic ulcer, site unspecified, unspecified as acute or chronic, without hemorrhage or perforation: Secondary | ICD-10-CM | POA: Diagnosis not present

## 2020-04-24 DIAGNOSIS — N1832 Chronic kidney disease, stage 3b: Secondary | ICD-10-CM | POA: Diagnosis not present

## 2020-04-24 DIAGNOSIS — Z0001 Encounter for general adult medical examination with abnormal findings: Secondary | ICD-10-CM | POA: Diagnosis not present

## 2020-04-24 DIAGNOSIS — J4520 Mild intermittent asthma, uncomplicated: Secondary | ICD-10-CM | POA: Diagnosis not present

## 2020-04-24 DIAGNOSIS — H8113 Benign paroxysmal vertigo, bilateral: Secondary | ICD-10-CM | POA: Diagnosis not present

## 2020-04-24 DIAGNOSIS — R809 Proteinuria, unspecified: Secondary | ICD-10-CM | POA: Diagnosis not present

## 2020-05-01 DIAGNOSIS — G4709 Other insomnia: Secondary | ICD-10-CM | POA: Diagnosis not present

## 2020-05-01 DIAGNOSIS — E039 Hypothyroidism, unspecified: Secondary | ICD-10-CM | POA: Diagnosis not present

## 2020-05-01 DIAGNOSIS — J309 Allergic rhinitis, unspecified: Secondary | ICD-10-CM | POA: Diagnosis not present

## 2020-05-01 DIAGNOSIS — E1122 Type 2 diabetes mellitus with diabetic chronic kidney disease: Secondary | ICD-10-CM | POA: Diagnosis not present

## 2020-05-01 DIAGNOSIS — H8113 Benign paroxysmal vertigo, bilateral: Secondary | ICD-10-CM | POA: Diagnosis not present

## 2020-05-01 DIAGNOSIS — N1832 Chronic kidney disease, stage 3b: Secondary | ICD-10-CM | POA: Diagnosis not present

## 2020-05-01 DIAGNOSIS — I2510 Atherosclerotic heart disease of native coronary artery without angina pectoris: Secondary | ICD-10-CM | POA: Diagnosis not present

## 2020-05-01 DIAGNOSIS — K219 Gastro-esophageal reflux disease without esophagitis: Secondary | ICD-10-CM | POA: Diagnosis not present

## 2020-05-01 DIAGNOSIS — J4520 Mild intermittent asthma, uncomplicated: Secondary | ICD-10-CM | POA: Diagnosis not present

## 2020-05-01 DIAGNOSIS — E782 Mixed hyperlipidemia: Secondary | ICD-10-CM | POA: Diagnosis not present

## 2020-05-14 DIAGNOSIS — Z7409 Other reduced mobility: Secondary | ICD-10-CM | POA: Diagnosis not present

## 2020-05-14 DIAGNOSIS — R296 Repeated falls: Secondary | ICD-10-CM | POA: Diagnosis not present

## 2020-05-17 ENCOUNTER — Other Ambulatory Visit: Payer: Self-pay | Admitting: Cardiology

## 2020-05-29 DIAGNOSIS — K219 Gastro-esophageal reflux disease without esophagitis: Secondary | ICD-10-CM | POA: Diagnosis not present

## 2020-05-29 DIAGNOSIS — I10 Essential (primary) hypertension: Secondary | ICD-10-CM | POA: Diagnosis not present

## 2020-06-13 DIAGNOSIS — Z7409 Other reduced mobility: Secondary | ICD-10-CM | POA: Diagnosis not present

## 2020-06-13 DIAGNOSIS — R296 Repeated falls: Secondary | ICD-10-CM | POA: Diagnosis not present

## 2020-07-09 ENCOUNTER — Ambulatory Visit: Payer: Medicare HMO | Admitting: Student

## 2020-07-09 ENCOUNTER — Encounter: Payer: Self-pay | Admitting: Student

## 2020-07-09 ENCOUNTER — Other Ambulatory Visit: Payer: Self-pay

## 2020-07-09 VITALS — BP 140/86 | HR 82 | Ht 61.0 in | Wt 295.8 lb

## 2020-07-09 DIAGNOSIS — I10 Essential (primary) hypertension: Secondary | ICD-10-CM | POA: Diagnosis not present

## 2020-07-09 DIAGNOSIS — I2510 Atherosclerotic heart disease of native coronary artery without angina pectoris: Secondary | ICD-10-CM

## 2020-07-09 DIAGNOSIS — E1159 Type 2 diabetes mellitus with other circulatory complications: Secondary | ICD-10-CM | POA: Diagnosis not present

## 2020-07-09 DIAGNOSIS — E785 Hyperlipidemia, unspecified: Secondary | ICD-10-CM | POA: Diagnosis not present

## 2020-07-09 NOTE — Progress Notes (Signed)
Cardiology Office Note    Date:  07/09/2020   ID:  [name], DOB [birth year], MRN [id number]  PCP:  Chelsey Squibb, MD  Cardiologist: Chelsey Lesches, MD    Chief Complaint  Patient presents with   Follow-up    6 month visit     History of Present Illness:    [name] is a 70 y.o. female with past medical history of CAD (s/p DES to LM in 2013 and known LCx disease with unsuccessful PCI in 2013, intermediate-risk NST in 01/2013), HTN, HLD, Hypothyroidism, history of PE and history of ovarian cancer who presents to the office today for 51-month follow-up.   She most recently had a telehealth visit with Dr. Domenic [name] in 11/2019 and denied any recent anginal symptoms. Her activity was limited secondary to arthritis. She was continued on her current medication regimen at that time.   In talking with the patient and her son today, she reports she mostly stays at home and only leaves the house for medical visits. She uses a rolling walker around the house and is in a wheelchair today. She denies any dyspnea on exertion or chest pain with ambulation. No recent orthopnea, PND or pitting edema. She reports her blood pressure has overall been well controlled when checked at home. She does report she can sometimes hear her pulse in her ear but checks her vitals and they have been stable when this occurs. This is typically most notable at night and she denies any associated palpitations.   Past Medical History:  Diagnosis Date   COPD (chronic obstructive pulmonary disease) ([place])    Coronary atherosclerosis of native coronary artery    Multivessel, DES distal left main, unsuccessful PCI circumflex 2/13, LVEF 50-55%   Essential hypertension    Hiatal hernia    Hypothyroidism    Mixed hyperlipidemia    NSTEMI (non-ST elevated myocardial infarction) ([place])    Ovarian cancer (HCC)    Overweight(278.02)    Pulmonary embolism (HCC)    Recurrent, previously on Coumadin   Type 2  diabetes mellitus ([place])     Past Surgical History:  Procedure Laterality Date   ABDOMINAL SURGERY     APPENDECTOMY     BREAST LUMPECTOMY     CATARACT EXTRACTION W/PHACO  01/29/2011   Procedure: CATARACT EXTRACTION PHACO AND INTRAOCULAR LENS PLACEMENT ([place]);  Surgeon: [name];  Location: [facility];  Service: Ophthalmology;  Laterality: Left;  CDE: 1.81   CESAREAN SECTION     CHOLECYSTECTOMY     Closure of abdominal wound/wound vac     LEFT HEART CATHETERIZATION WITH CORONARY ANGIOGRAM N/A 03/13/2011   Procedure: LEFT HEART CATHETERIZATION WITH CORONARY ANGIOGRAM;  Surgeon: Chelsey Mocha, MD;  Location[facility] C[facility] LAB;  Service: Cardiovascular;  Laterality: N/A;   PERCUTANEOUS CORONARY STENT INTERVENTION (PCI-S) N/A 03/18/2011   Procedure: PERCUTANEOUS CORONARY STENT INTERVENTION (PCI-S);  Surgeon: Chelsey Mocha, MD;  Location: [facility] CATH LAB;  Service: Cardiovascular;  Laterality: N/A;   SMALL INTESTINE SURGERY  12/2007   Dr [name]   TONSILLECTOMY      Current Medications: Outpatient Medications Prior to Visit  Medication Sig Dispense Refill   albuterol (PROVENTIL) (2.5 MG/3ML) 0.083% nebulizer solution Take 2.5 mg by nebulization every 6 (six) hours as needed. For wheezing. Combines with atrovent     ALPRAZolam (XANAX) 1 MG tablet Take 1 mg by mouth at bedtime as needed for anxiety or sleep.      amLODipine (NORVASC) 10 MG tablet Take 10  mg by mouth daily.     aspirin EC 81 MG tablet Take 81 mg by mouth daily.     BREO ELLIPTA 100-25 MCG/INH AEPB INHALE 1 PUFF INTO THE LUNGS DAILY  5   carvedilol (COREG) 6.25 MG tablet TAKE 1 TABLET (6.25 MG TOTAL) BY MOUTH 2 (TWO) TIMES DAILY WITH A MEAL. 180 tablet 3   clopidogrel (PLAVIX) 75 MG tablet TAKE 1 TABLET BY MOUTH EVERY DAY 90 tablet 3   ezetimibe (ZETIA) 10 MG tablet Take 10 mg by mouth daily.     famotidine (PEPCID) 40 MG tablet SMARTSIG:1 Tablet(s) By Mouth Every Evening     Ferrous Sulfate (IRON) 325 (65 FE) MG TABS Take 1 tablet by mouth  daily after breakfast.      glipiZIDE (GLUCOTROL) 10 MG tablet Take 10 mg by mouth 2 (two) times daily before a meal.     glucose blood test strip 1 each by Other route as needed for other. Use as instructed one touch ultra     levocetirizine (XYZAL) 5 MG tablet Take 5 mg by mouth daily.     levothyroxine (SYNTHROID, LEVOTHROID) 150 MCG tablet Take 1 tablet (150 mcg total) by mouth every morning. 30 tablet 1   meclizine (ANTIVERT) 25 MG tablet Take 25 mg by mouth 2 (two) times daily.      metFORMIN (GLUCOPHAGE) 500 MG tablet 500 mg. Take 1 Tablet in the AM and take 1/2 Tablet in the PM  3   mupirocin ointment (BACTROBAN) 2 % Apply 1 application topically 4 (four) times daily as needed.     nitroGLYCERIN (NITROSTAT) 0.4 MG SL tablet PLACE 1 TABLET UNDER TONGUE EVERY 5 MIN AS NEEDED FOR CHEST PAIN (UP TO 3 DOSES) 25 tablet 4   pantoprazole (PROTONIX) 40 MG tablet Take 40 mg by mouth daily.     rosuvastatin (CRESTOR) 40 MG tablet TAKE 1 TABLET BY MOUTH EVERY DAY AT NIGHT  3   telmisartan (MICARDIS) 80 MG tablet Take 80 mg by mouth daily.  3   traMADol (ULTRAM) 50 MG tablet Take 50 mg by mouth 2 (two) times daily as needed.     triamterene-hydrochlorothiazide (DYAZIDE) 37.5-25 MG capsule Take 1 capsule by mouth daily.      Vitamin D, Ergocalciferol, 50 MCG (2000 UT) CAPS Take by mouth.     calcium-vitamin D (OSCAL WITH D) 500-200 MG-UNIT per tablet Take 1 tablet by mouth daily.      glipiZIDE (GLUCOTROL XL) 5 MG 24 hr tablet Take 5 mg by mouth 2 (two) times daily.     No facility-administered medications prior to visit.     Allergies:   Codeine, Penicillins, and Sulfa antibiotics   Social History   Socioeconomic History   Marital status: Widowed    Spouse name: Not on file   Number of children: Not on file   Years of education: Not on file   Highest education level: Not on file  Occupational History   Not on file  Tobacco Use   Smoking status: Former    Pack years: 0.00    Types:  Cigarettes    Quit date: 01/27/1980    Years since quitting: 40.4   Smokeless tobacco: Never  Vaping Use   Vaping Use: Never used  Substance and Sexual Activity   Alcohol use: No    Alcohol/week: 0.0 standard drinks   Drug use: No   Sexual activity: Not on file  Other Topics Concern   Not on file  Social History Narrative   Not on file   Social Determinants of Health   Financial Resource Strain: Not on file  Food Insecurity: Not on file  Transportation Needs: Not on file  Physical Activity: Not on file  Stress: Not on file  Social Connections: Not on file     Family History:  The patient's family history includes Cirrhosis in her sister; Coronary artery disease in her father and mother; Fibromyalgia in her sister; Heart attack in her father and mother; Osteoporosis in her sister.   Review of Systems:    Please see the history of present illness.     All other systems reviewed and are otherwise negative except as noted above.   Physical Exam:    VS:  BP 140/86   Pulse 82   Ht 5\' 1"  (1.549 m)   Wt 295 lb 12.8 oz (134.2 kg)   SpO2 98%   BMI 55.89 kg/m    General: Pleasant obese female appearing in no acute distress. Sitting in wheelchair.  Head: Normocephalic, atraumatic. Neck: No carotid bruits. JVD not elevated.  Lungs: Respirations regular and unlabored, without wheezes or rales.  Heart: Regular rate and rhythm. No S3 or S4.  No murmur, no rubs, or gallops appreciated. Abdomen: Appears non-distended. No obvious abdominal masses. Msk:  Strength and tone appear normal for age. No obvious joint deformities or effusions. Extremities: No clubbing or cyanosis. No pitting edema. Varicose veins noted. Distal pedal pulses are 2+ bilaterally. Neuro: Alert and oriented X 3. Moves all extremities spontaneously. No focal deficits noted. Psych:  Responds to questions appropriately with a normal affect. Skin: No rashes or lesions noted  Wt Readings from Last 3 Encounters:   07/09/20 295 lb 12.8 oz (134.2 kg)  12/01/19 278 lb (126.1 kg)  11/28/18 284 lb (128.8 kg)     Studies/Labs Reviewed:   EKG:  EKG is ordered today.  The ekg ordered today demonstrates NSR, HR 85 with no acute ST changes when compared to prior tracings.   Recent Labs: No results found for requested labs within last 8760 hours.   Lipid Panel    Component Value Date/Time   CHOL 185 02/23/2014 1142   TRIG 93 02/23/2014 1142   HDL 56 02/23/2014 1142   CHOLHDL 3.3 02/23/2014 1142   VLDL 19 02/23/2014 1142   LDLCALC 110 (H) 02/23/2014 1142    Additional studies/ records that were reviewed today include:   NST: 2015 IMPRESSION:  1.Abnormal Lexiscan myocardial perfusion imaging.   2. Large myocardial scar in the apex and mid to distal anteroseptal  wall with no reversible ischemia.   3. Moderate-sized area of ischemia in the lateral wall/Left  circumflex territory   4.  Abnormal pharmacologic stress EKG for ischemia   5.  Normal left ventricular ejection fraction.   6. Overall intermediate risk study for major cardiac events. There  is a large area of scar in the apex, a moderate-sized area of  myocardium at jeopardy in the lateral wall corresponding to known  left circumflex disease.  Assessment:    1. Coronary artery disease involving native coronary artery of native heart without angina pectoris   2. Essential hypertension   3. Hyperlipidemia LDL goal <70   4. Type 2 diabetes mellitus with other circulatory complication, without long-term current use of insulin ([place])      Plan:   In order of problems listed above:  1. CAD - She is s/p DES to LM in 2013 and known LCx  disease with unsuccessful PCI in 2013 and her most recent ischemic evaluation was an intermediate-risk NST in 01/2013. - She denies any recent anginal symptoms. EKG is without acute ST changes when compared to prior tracings. Will continue with medical therapy to include ASA, Plavix (has been on this  long-term due to LM stent), Coreg, Zetia and Crestor.   2. HTN - BP is at 140/86 during today's visit and she reports this has been well-controlled when checked at home. Continue current regimen at this time. Coreg could be further titrated in the future if needed for additional BP control.   3. HLD - Her LDL was elevated to 109 when checked by her PCP in 03/2020 and Zetia was added to her medication regimen. Continue Zetia 10mg  daily along with Crestor 40mg  daily. If LDL remains above goal of less than 70, could consider PCSK-9 inhibitor therapy.   4. Type 2 DM - Followed by PCP. Hgb A1c was at 6.5 in 03/2020.   Medication Adjustments/Labs and Tests Ordered: Current medicines are reviewed at length with the patient today.  Concerns regarding medicines are outlined above.  Medication changes, Labs and Tests ordered today are listed in the Patient Instructions below. Patient Instructions  Medication Instructions:  Your physician recommends that you continue on your current medications as directed. Please refer to the Current Medication list given to you today.  *If you need a refill on your cardiac medications before your next appointment, please call your pharmacy*   Lab Work: NONE   If you have labs (blood work) drawn today and your tests are completely normal, you will receive your results only by: [place] (if you have MyChart) OR A paper copy in the mail If you have any lab test that is abnormal or we need to change your treatment, we will call you to review the results.   Testing/Procedures: NONE    Follow-Up: At [facility], you and your health needs are our priority.  As part of our continuing mission to provide you with exceptional heart care, we have created designated Provider Care Teams.  These Care Teams include your primary Cardiologist (physician) and Advanced Practice Providers (APPs -  Physician Assistants and Nurse Practitioners) who all work together to  provide you with the care you need, when you need it.  We recommend signing up for the patient portal called "MyChart".  Sign up information is provided on this After Visit Summary.  MyChart is used to connect with patients for Virtual Visits (Telemedicine).  Patients are able to view lab/test results, encounter notes, upcoming appointments, etc.  Non-urgent messages can be sent to your provider as well.   To learn more about what you can do with MyChart, go to [email or web address].    Your next appointment:   6 month(s)  The format for your next appointment:   In Person  Provider:   [name], MD   Other Instructions Thank you for choosing [facility]!     Signed, Erma Heritage, PA-C  07/09/2020 6:46 PM    [place] [address] [place], [place] [address] Phone: [phone] Fax: [phone]

## 2020-07-09 NOTE — Patient Instructions (Signed)

## 2020-07-14 DIAGNOSIS — Z7409 Other reduced mobility: Secondary | ICD-10-CM | POA: Diagnosis not present

## 2020-07-14 DIAGNOSIS — R296 Repeated falls: Secondary | ICD-10-CM | POA: Diagnosis not present

## 2020-07-22 ENCOUNTER — Other Ambulatory Visit: Payer: Self-pay | Admitting: Cardiology

## 2020-07-25 DIAGNOSIS — K219 Gastro-esophageal reflux disease without esophagitis: Secondary | ICD-10-CM | POA: Diagnosis not present

## 2020-07-25 DIAGNOSIS — I10 Essential (primary) hypertension: Secondary | ICD-10-CM | POA: Diagnosis not present

## 2020-08-13 DIAGNOSIS — R296 Repeated falls: Secondary | ICD-10-CM | POA: Diagnosis not present

## 2020-08-13 DIAGNOSIS — Z7409 Other reduced mobility: Secondary | ICD-10-CM | POA: Diagnosis not present

## 2020-10-04 DIAGNOSIS — E119 Type 2 diabetes mellitus without complications: Secondary | ICD-10-CM | POA: Diagnosis not present

## 2020-10-04 DIAGNOSIS — I10 Essential (primary) hypertension: Secondary | ICD-10-CM | POA: Diagnosis not present

## 2020-10-04 DIAGNOSIS — E039 Hypothyroidism, unspecified: Secondary | ICD-10-CM | POA: Diagnosis not present

## 2020-10-09 DIAGNOSIS — K219 Gastro-esophageal reflux disease without esophagitis: Secondary | ICD-10-CM | POA: Diagnosis not present

## 2020-10-09 DIAGNOSIS — R809 Proteinuria, unspecified: Secondary | ICD-10-CM | POA: Diagnosis not present

## 2020-10-09 DIAGNOSIS — E1122 Type 2 diabetes mellitus with diabetic chronic kidney disease: Secondary | ICD-10-CM | POA: Diagnosis not present

## 2020-10-09 DIAGNOSIS — Z0001 Encounter for general adult medical examination with abnormal findings: Secondary | ICD-10-CM | POA: Diagnosis not present

## 2020-10-09 DIAGNOSIS — E039 Hypothyroidism, unspecified: Secondary | ICD-10-CM | POA: Diagnosis not present

## 2020-10-09 DIAGNOSIS — I129 Hypertensive chronic kidney disease with stage 1 through stage 4 chronic kidney disease, or unspecified chronic kidney disease: Secondary | ICD-10-CM | POA: Diagnosis not present

## 2020-10-09 DIAGNOSIS — E782 Mixed hyperlipidemia: Secondary | ICD-10-CM | POA: Diagnosis not present

## 2020-10-09 DIAGNOSIS — Z23 Encounter for immunization: Secondary | ICD-10-CM | POA: Diagnosis not present

## 2020-10-09 DIAGNOSIS — I2510 Atherosclerotic heart disease of native coronary artery without angina pectoris: Secondary | ICD-10-CM | POA: Diagnosis not present

## 2020-10-09 DIAGNOSIS — N1832 Chronic kidney disease, stage 3b: Secondary | ICD-10-CM | POA: Diagnosis not present

## 2020-10-25 DIAGNOSIS — K219 Gastro-esophageal reflux disease without esophagitis: Secondary | ICD-10-CM | POA: Diagnosis not present

## 2020-10-25 DIAGNOSIS — I10 Essential (primary) hypertension: Secondary | ICD-10-CM | POA: Diagnosis not present

## 2020-11-25 DIAGNOSIS — K219 Gastro-esophageal reflux disease without esophagitis: Secondary | ICD-10-CM | POA: Diagnosis not present

## 2020-11-25 DIAGNOSIS — I10 Essential (primary) hypertension: Secondary | ICD-10-CM | POA: Diagnosis not present

## 2020-12-23 ENCOUNTER — Other Ambulatory Visit: Payer: Self-pay | Admitting: Cardiology

## 2020-12-25 DIAGNOSIS — I10 Essential (primary) hypertension: Secondary | ICD-10-CM | POA: Diagnosis not present

## 2020-12-25 DIAGNOSIS — K219 Gastro-esophageal reflux disease without esophagitis: Secondary | ICD-10-CM | POA: Diagnosis not present

## 2020-12-27 ENCOUNTER — Ambulatory Visit: Payer: Medicare HMO | Admitting: Cardiology

## 2020-12-30 ENCOUNTER — Telehealth: Payer: Self-pay | Admitting: Student

## 2020-12-30 NOTE — Telephone Encounter (Signed)
Office visit switched to VV per patient request.

## 2020-12-30 NOTE — Telephone Encounter (Signed)
Patient requesting appointment 12/14 be virtual, because she says her son has her car and she is in a wheelchair and she cannot push herself. She also says with all these viruses going around she does not like to go out.

## 2021-01-08 ENCOUNTER — Encounter: Payer: Self-pay | Admitting: Student

## 2021-01-08 ENCOUNTER — Telehealth: Payer: Self-pay

## 2021-01-08 ENCOUNTER — Ambulatory Visit (INDEPENDENT_AMBULATORY_CARE_PROVIDER_SITE_OTHER): Payer: Medicare HMO | Admitting: Student

## 2021-01-08 VITALS — BP 127/69 | HR 71 | Ht 61.0 in

## 2021-01-08 DIAGNOSIS — E1159 Type 2 diabetes mellitus with other circulatory complications: Secondary | ICD-10-CM

## 2021-01-08 DIAGNOSIS — E785 Hyperlipidemia, unspecified: Secondary | ICD-10-CM

## 2021-01-08 DIAGNOSIS — I2510 Atherosclerotic heart disease of native coronary artery without angina pectoris: Secondary | ICD-10-CM

## 2021-01-08 DIAGNOSIS — I10 Essential (primary) hypertension: Secondary | ICD-10-CM | POA: Diagnosis not present

## 2021-01-08 NOTE — Telephone Encounter (Signed)
°  Patient Consent for Virtual Visit        [name] has provided verbal consent on 01/08/2021 for a virtual visit (video or telephone).   CONSENT FOR VIRTUAL VISIT FOR:  [name]  By participating in this virtual visit I agree to the following:  I hereby voluntarily request, consent and authorize [facility] and its employed or contracted physicians, physician assistants, nurse practitioners or other licensed health care professionals (the Practitioner), to provide me with telemedicine health care services (the Services") as deemed necessary by the treating Practitioner. I acknowledge and consent to receive the Services by the Practitioner via telemedicine. I understand that the telemedicine visit will involve communicating with the Practitioner through live audiovisual communication technology and the disclosure of certain medical information by electronic transmission. I acknowledge that I have been given the opportunity to request an in-person assessment or other available alternative prior to the telemedicine visit and am voluntarily participating in the telemedicine visit.  I understand that I have the right to withhold or withdraw my consent to the use of telemedicine in the course of my care at any time, without affecting my right to future care or treatment, and that the Practitioner or I may terminate the telemedicine visit at any time. I understand that I have the right to inspect all information obtained and/or recorded in the course of the telemedicine visit and may receive copies of available information for a reasonable fee.  I understand that some of the potential risks of receiving the Services via telemedicine include:  Delay or interruption in medical evaluation due to technological equipment failure or disruption; Information transmitted may not be sufficient (e.g. poor resolution of images) to allow for appropriate medical decision making by the  Practitioner; and/or  In rare instances, security protocols could fail, causing a breach of personal health information.  Furthermore, I acknowledge that it is my responsibility to provide information about my medical history, conditions and care that is complete and accurate to the best of my ability. I acknowledge that Practitioner's advice, recommendations, and/or decision may be based on factors not within their control, such as incomplete or inaccurate data provided by me or distortions of diagnostic images or specimens that may result from electronic transmissions. I understand that the practice of medicine is not an exact science and that Practitioner makes no warranties or guarantees regarding treatment outcomes. I acknowledge that a copy of this consent can be made available to me via my patient portal ([place]), or I can request a printed copy by calling the office of [facility].    I understand that my insurance will be billed for this visit.   I have read or had this consent read to me. I understand the contents of this consent, which adequately explains the benefits and risks of the Services being provided via telemedicine.  I have been provided ample opportunity to ask questions regarding this consent and the Services and have had my questions answered to my satisfaction. I give my informed consent for the services to be provided through the use of telemedicine in my medical care

## 2021-01-08 NOTE — Patient Instructions (Signed)

## 2021-01-08 NOTE — Progress Notes (Signed)
Virtual Visit via Telephone Note   This visit type was conducted due to national recommendations for restrictions regarding the COVID-19 Pandemic (e.g. social distancing) in an effort to limit this patient's exposure and mitigate transmission in our community.  Due to her co-morbid illnesses, this patient is at least at moderate risk for complications without adequate follow up.  This format is felt to be most appropriate for this patient at this time.  The patient did not have access to video technology/had technical difficulties with video requiring transitioning to audio format only (telephone).  All issues noted in this document were discussed and addressed.  No physical exam could be performed with this format.  Please refer to the patient's chart for her  consent to telehealth for [facility].    Date:  01/08/2021   ID:  [name], DOB September 15, 1950, MRN [id number] The patient was identified using 2 identifiers.  Patient Location: Home Provider Location: Office/Clinic  PCP:  [name], MD   [facility] Providers Cardiologist:  [name], MD {  Evaluation Performed:  Follow-Up Visit  Chief Complaint: 6 month visit  History of Present Illness:    [name] is a 70 y.o. female with  with past medical history of CAD (s/p DES to LM in 2013 and known LCx disease with unsuccessful PCI in 2013, intermediate-risk NST in 01/2013), HTN, HLD, Hypothyroidism, Type 2 DM, history of PE and history of ovarian cancer who presents for a 75-month follow-up telehealth visit.  She was last examined by myself in 06/2020 and reported mostly staying at home at that time and would use a walker at home to assist with ambulation. She denied any recent anginal symptoms and was continued on her current medication regimen including ASA and Plavix as she had been on DAPT long-term due to prior LM stent.  In talking with the patient today, she reports mostly being homebound as  she is in a wheelchair when she goes out and about. Uses a rolling walker at home. Activity is mostly limited secondary to arthritis. She denies any recent chest pain or dyspnea on exertion. No reported orthopnea, PND or lower extremity edema. She has been checking her blood pressure on occasion at home and it is typically well controlled, at 127/69 when checked earlier today. She was recently started on Zetia and Farxiga by her PCP and reports tolerating the medications well without any side effects.  The patient does not have symptoms concerning for COVID-19 infection (fever, chills, cough, or new shortness of breath).    Past Medical History:  Diagnosis Date   COPD (chronic obstructive pulmonary disease) ([place])    Coronary atherosclerosis of native coronary artery    Multivessel, DES distal left main, unsuccessful PCI circumflex 2/13, LVEF 50-55%   Essential hypertension    Hiatal hernia    Hypothyroidism    Mixed hyperlipidemia    NSTEMI (non-ST elevated myocardial infarction) ([place])    Ovarian cancer (HCC)    Overweight(278.02)    Pulmonary embolism (HCC)    Recurrent, previously on Coumadin   Type 2 diabetes mellitus ([place])    Past Surgical History:  Procedure Laterality Date   ABDOMINAL SURGERY     APPENDECTOMY     BREAST LUMPECTOMY     CATARACT EXTRACTION W/PHACO  01/29/2011   Procedure: CATARACT EXTRACTION PHACO AND INTRAOCULAR LENS PLACEMENT ([place]);  Surgeon: [name];  Location: [facility];  Service: Ophthalmology;  Laterality: Left;  CDE: 1.81   CESAREAN SECTION  CHOLECYSTECTOMY     Closure of abdominal wound/wound vac     LEFT HEART CATHETERIZATION WITH CORONARY ANGIOGRAM N/A 03/13/2011   Procedure: LEFT HEART CATHETERIZATION WITH CORONARY ANGIOGRAM;  Surgeon: [name], MD;  Location[facility] C[facility] LAB;  Service: Cardiovascular;  Laterality: N/A;   PERCUTANEOUS CORONARY STENT INTERVENTION (PCI-S) N/A 03/18/2011   Procedure: PERCUTANEOUS CORONARY STENT INTERVENTION (PCI-S);   Surgeon: [name], MD;  Location: [facility] CATH LAB;  Service: Cardiovascular;  Laterality: N/A;   SMALL INTESTINE SURGERY  12/2007   Dr [name]   TONSILLECTOMY       Current Meds  Medication Sig   albuterol (PROVENTIL) (2.5 MG/3ML) 0.083% nebulizer solution Take 2.5 mg by nebulization every 6 (six) hours as needed. For wheezing. Combines with atrovent   ALPRAZolam (XANAX) 1 MG tablet Take 1 mg by mouth at bedtime as needed for anxiety or sleep.    amLODipine (NORVASC) 10 MG tablet Take 10 mg by mouth daily.   aspirin EC 81 MG tablet Take 81 mg by mouth daily.   BREO ELLIPTA 100-25 MCG/INH AEPB INHALE 1 PUFF INTO THE LUNGS DAILY   carvedilol (COREG) 6.25 MG tablet TAKE 1 TABLET BY MOUTH 2 TIMES DAILY WITH A MEAL.   clopidogrel (PLAVIX) 75 MG tablet TAKE 1 TABLET BY MOUTH EVERY DAY   ezetimibe (ZETIA) 10 MG tablet Take 10 mg by mouth daily.   famotidine (PEPCID) 40 MG tablet SMARTSIG:1 Tablet(s) By Mouth Every Evening   Ferrous Sulfate (IRON) 325 (65 FE) MG TABS Take 1 tablet by mouth daily after breakfast.    glipiZIDE (GLUCOTROL) 10 MG tablet Take 10 mg by mouth 2 (two) times daily before a meal.   glucose blood test strip 1 each by Other route as needed for other. Use as instructed one touch ultra   levocetirizine (XYZAL) 5 MG tablet Take 5 mg by mouth daily.   levothyroxine (SYNTHROID, LEVOTHROID) 150 MCG tablet Take 1 tablet (150 mcg total) by mouth every morning.   meclizine (ANTIVERT) 25 MG tablet Take 25 mg by mouth 2 (two) times daily.    metFORMIN (GLUCOPHAGE) 500 MG tablet 500 mg. Take 1 Tablet in the AM and take 1/2 Tablet in the PM   mupirocin ointment (BACTROBAN) 2 % Apply 1 application topically 4 (four) times daily as needed.   nitroGLYCERIN (NITROSTAT) 0.4 MG SL tablet PLACE 1 TABLET UNDER TONGUE EVERY 5 MIN AS NEEDED FOR CHEST PAIN (UP TO 3 DOSES)   pantoprazole (PROTONIX) 40 MG tablet Take 40 mg by mouth daily.   rosuvastatin (CRESTOR) 40 MG tablet TAKE 1 TABLET BY MOUTH  EVERY DAY AT NIGHT   telmisartan (MICARDIS) 80 MG tablet Take 80 mg by mouth daily.   traMADol (ULTRAM) 50 MG tablet Take 50 mg by mouth 2 (two) times daily as needed.   triamterene-hydrochlorothiazide (DYAZIDE) 37.5-25 MG capsule Take 1 capsule by mouth daily.    Vitamin D, Ergocalciferol, 50 MCG (2000 UT) CAPS Take by mouth.   [DISCONTINUED] esomeprazole (NEXIUM) 40 MG capsule Take 40 mg by mouth daily before breakfast.      Allergies:   Codeine, Penicillins, and Sulfa antibiotics   Social History   Tobacco Use   Smoking status: Former    Types: Cigarettes    Quit date: 01/27/1980    Years since quitting: 40.9   Smokeless tobacco: Never  Vaping Use   Vaping Use: Never used  Substance Use Topics   Alcohol use: No    Alcohol/week: 0.0 standard drinks  Drug use: No     Family Hx: The patient's family history includes Cirrhosis in her sister; Coronary artery disease in her father and mother; Fibromyalgia in her sister; Heart attack in her father and mother; Osteoporosis in her sister.  ROS:   Please see the history of present illness.     All other systems reviewed and are negative.   Prior CV studies:   The following studies were reviewed today:  Echocardiogram: 02/2011 Study Conclusions   - Left ventricle: Possible mild posterior hypokinesis.    However, even with contrast injection posterior wall    difficult to visualize. No other obvious segmental wall    motion abnormalities. The cavity size was normal. Wall    thickness was normal. Systolic function was normal. The    estimated ejection fraction was in the range of 50% to    55%. Left ventricular diastolic function parameters were    normal.   NST: 01/2013 IMPRESSION:  1.Abnormal Lexiscan myocardial perfusion imaging.   2. Large myocardial scar in the apex and mid to distal anteroseptal  wall with no reversible ischemia.   3. Moderate-sized area of ischemia in the lateral wall/Left  circumflex territory    4.  Abnormal pharmacologic stress EKG for ischemia   5.  Normal left ventricular ejection fraction.    Labs/Other Tests and Data Reviewed:    EKG:  An ECG dated 07/09/2020 was personally reviewed today and demonstrated:  NSR, HR 85 with slight ST depression along Lead III.    Recent Labs: No results found for requested labs within last 8760 hours.   Recent Lipid Panel Lab Results  Component Value Date/Time   CHOL 185 02/23/2014 11:42 AM   TRIG 93 02/23/2014 11:42 AM   HDL 56 02/23/2014 11:42 AM   CHOLHDL 3.3 02/23/2014 11:42 AM   LDLCALC 110 (H) 02/23/2014 11:42 AM    Wt Readings from Last 3 Encounters:  07/09/20 295 lb 12.8 oz (134.2 kg)  12/01/19 278 lb (126.1 kg)  11/28/18 284 lb (128.8 kg)      Objective:    Vital Signs:  BP 127/69    Pulse 71    Ht 5\' 1"  (1.549 m)    BMI 55.89 kg/m    General: Pleasant female sounding in NAD Psych: Normal affect. Neuro: Alert and oriented X 3.  Lungs:  Resp regular and unlabored while talking on the phone.     ASSESSMENT & PLAN:    1. CAD - She is s/p DES to LM in 2013 and known LCx disease with unsuccessful PCI in 2013 and she did have an NST in 01/2013 which was intermediate risk as outlined above.  - Her activity is limited secondary to arthritis but she denies any recent chest pain or dyspnea on exertion. She has remained on long-term DAPT with ASA and Plavix given her prior left main stent. No reports of active bleeding with this and hemoglobin and platelets were stable by recent labs in 09/2020. She also remains on Coreg 6.25 mg twice daily and Crestor 40 mg daily.   2. HTN - Her blood pressure has been well controlled when checked at home and was at 127/69 when checked earlier today.  Continue current medication regimen with Amlodipine 10mg  daily, Coreg 6.25 mg twice daily, Telmisartan 80 mg daily and Triamterene-HCTZ 37.5-25 mg daily.   3. HLD - Recent FLP in 09/2020 showed her LDL had improved from 109 down to 79.  Continue Crestor 40 mg daily and Zetia  10 mg daily as this was recently started by her PCP.  4. Type 2 DM - Her Hgb A1c was at 7.1 in 09/2020. Followed by her PCP and she remains on Glipizide, Metformin and Farxiga.    COVID-19 Education: The signs and symptoms of COVID-19 were discussed with the patient and how to seek care for testing (follow up with PCP or arrange E-visit). The importance of social distancing was discussed today.  Time:   Today, I have spent 14 minutes with the patient with telehealth technology discussing the above problems.     Medication Adjustments/Labs and Tests Ordered: Current medicines are reviewed at length with the patient today.  Concerns regarding medicines are outlined above.   Tests Ordered: No orders of the defined types were placed in this encounter.   Medication Changes: No orders of the defined types were placed in this encounter.   Follow Up:  In Person in 6 month(s)  Signed, Erma Heritage, PA-C  01/08/2021 1:53 PM     [facility]

## 2021-01-24 DIAGNOSIS — K219 Gastro-esophageal reflux disease without esophagitis: Secondary | ICD-10-CM | POA: Diagnosis not present

## 2021-01-24 DIAGNOSIS — I10 Essential (primary) hypertension: Secondary | ICD-10-CM | POA: Diagnosis not present

## 2021-03-14 DIAGNOSIS — E782 Mixed hyperlipidemia: Secondary | ICD-10-CM | POA: Diagnosis not present

## 2021-03-14 DIAGNOSIS — E1122 Type 2 diabetes mellitus with diabetic chronic kidney disease: Secondary | ICD-10-CM | POA: Diagnosis not present

## 2021-03-14 DIAGNOSIS — E039 Hypothyroidism, unspecified: Secondary | ICD-10-CM | POA: Diagnosis not present

## 2021-03-20 DIAGNOSIS — H8113 Benign paroxysmal vertigo, bilateral: Secondary | ICD-10-CM | POA: Diagnosis not present

## 2021-03-20 DIAGNOSIS — E1122 Type 2 diabetes mellitus with diabetic chronic kidney disease: Secondary | ICD-10-CM | POA: Diagnosis not present

## 2021-03-20 DIAGNOSIS — M545 Low back pain, unspecified: Secondary | ICD-10-CM | POA: Diagnosis not present

## 2021-03-20 DIAGNOSIS — K219 Gastro-esophageal reflux disease without esophagitis: Secondary | ICD-10-CM | POA: Diagnosis not present

## 2021-03-20 DIAGNOSIS — J4520 Mild intermittent asthma, uncomplicated: Secondary | ICD-10-CM | POA: Diagnosis not present

## 2021-03-20 DIAGNOSIS — N1832 Chronic kidney disease, stage 3b: Secondary | ICD-10-CM | POA: Diagnosis not present

## 2021-03-20 DIAGNOSIS — I129 Hypertensive chronic kidney disease with stage 1 through stage 4 chronic kidney disease, or unspecified chronic kidney disease: Secondary | ICD-10-CM | POA: Diagnosis not present

## 2021-03-20 DIAGNOSIS — I2510 Atherosclerotic heart disease of native coronary artery without angina pectoris: Secondary | ICD-10-CM | POA: Diagnosis not present

## 2021-03-20 DIAGNOSIS — R809 Proteinuria, unspecified: Secondary | ICD-10-CM | POA: Diagnosis not present

## 2021-03-20 DIAGNOSIS — E039 Hypothyroidism, unspecified: Secondary | ICD-10-CM | POA: Diagnosis not present

## 2021-03-20 DIAGNOSIS — E782 Mixed hyperlipidemia: Secondary | ICD-10-CM | POA: Diagnosis not present

## 2021-04-23 DIAGNOSIS — N1832 Chronic kidney disease, stage 3b: Secondary | ICD-10-CM | POA: Diagnosis not present

## 2021-04-23 DIAGNOSIS — J4520 Mild intermittent asthma, uncomplicated: Secondary | ICD-10-CM | POA: Diagnosis not present

## 2021-04-23 DIAGNOSIS — E782 Mixed hyperlipidemia: Secondary | ICD-10-CM | POA: Diagnosis not present

## 2021-04-23 DIAGNOSIS — I2510 Atherosclerotic heart disease of native coronary artery without angina pectoris: Secondary | ICD-10-CM | POA: Diagnosis not present

## 2021-04-23 DIAGNOSIS — R809 Proteinuria, unspecified: Secondary | ICD-10-CM | POA: Diagnosis not present

## 2021-04-23 DIAGNOSIS — E1122 Type 2 diabetes mellitus with diabetic chronic kidney disease: Secondary | ICD-10-CM | POA: Diagnosis not present

## 2021-04-23 DIAGNOSIS — I129 Hypertensive chronic kidney disease with stage 1 through stage 4 chronic kidney disease, or unspecified chronic kidney disease: Secondary | ICD-10-CM | POA: Diagnosis not present

## 2021-04-23 DIAGNOSIS — H8113 Benign paroxysmal vertigo, bilateral: Secondary | ICD-10-CM | POA: Diagnosis not present

## 2021-04-23 DIAGNOSIS — K219 Gastro-esophageal reflux disease without esophagitis: Secondary | ICD-10-CM | POA: Diagnosis not present

## 2021-04-23 DIAGNOSIS — E039 Hypothyroidism, unspecified: Secondary | ICD-10-CM | POA: Diagnosis not present

## 2021-04-23 DIAGNOSIS — M545 Low back pain, unspecified: Secondary | ICD-10-CM | POA: Diagnosis not present

## 2021-06-16 ENCOUNTER — Encounter: Payer: Self-pay | Admitting: Cardiology

## 2021-06-16 ENCOUNTER — Ambulatory Visit (INDEPENDENT_AMBULATORY_CARE_PROVIDER_SITE_OTHER): Payer: No Typology Code available for payment source | Admitting: Cardiology

## 2021-06-16 VITALS — BP 128/66 | HR 80 | Ht 61.0 in

## 2021-06-16 DIAGNOSIS — E782 Mixed hyperlipidemia: Secondary | ICD-10-CM

## 2021-06-16 DIAGNOSIS — I25119 Atherosclerotic heart disease of native coronary artery with unspecified angina pectoris: Secondary | ICD-10-CM | POA: Diagnosis not present

## 2021-06-16 DIAGNOSIS — I10 Essential (primary) hypertension: Secondary | ICD-10-CM

## 2021-06-16 NOTE — Patient Instructions (Signed)
Medication Instructions:  Your physician recommends that you continue on your current medications as directed. Please refer to the Current Medication list given to you today.   Labwork: None today  Testing/Procedures: None today  Follow-Up: 6 months  Any Other Special Instructions Will Be Listed Below (If Applicable).  If you need a refill on your cardiac medications before your next appointment, please call your pharmacy.  

## 2021-06-16 NOTE — Progress Notes (Signed)
Cardiology Office Note  Date: 06/16/2021   ID: Chelsey, [name] 23-Apr-1950, MRN [id number]  PCP:  [name], MD  Cardiologist:  Chelsey Lesches, MD Electrophysiologist:  None   Chief Complaint  Patient presents with   Cardiac follow-up    History of Present Illness: [name] is a 71 y.o. female last seen in December 2022 by Ms. Strader PA-C.  She is here today with her son for a follow-up visit.  Reports no angina symptoms with very limited activity.  She has difficulty walking and standing due to severe arthritis.  Uses a rollator or wheelchair most of the time.  I reviewed her medications which are noted below.  She had lab work with Dr. [name] in the interim, continues to follow regularly.  Today's blood pressure is adequately controlled.  She does not report any worsening leg edema.  I reviewed her ECG today which shows sinus rhythm with low voltage in the precordial leads.  Past Medical History:  Diagnosis Date   COPD (chronic obstructive pulmonary disease) ([place])    Coronary atherosclerosis of native coronary artery    Multivessel, DES distal left main, unsuccessful PCI circumflex 2/13, LVEF 50-55%   Essential hypertension    Hiatal hernia    Hypothyroidism    Mixed hyperlipidemia    NSTEMI (non-ST elevated myocardial infarction) ([place])    Ovarian cancer (HCC)    Overweight(278.02)    Pulmonary embolism (HCC)    Recurrent, previously on Coumadin   Type 2 diabetes mellitus ([place])     Past Surgical History:  Procedure Laterality Date   ABDOMINAL SURGERY     APPENDECTOMY     BREAST LUMPECTOMY     CATARACT EXTRACTION W/PHACO  01/29/2011   Procedure: CATARACT EXTRACTION PHACO AND INTRAOCULAR LENS PLACEMENT ([place]);  Surgeon: [name];  Location: [facility];  Service: Ophthalmology;  Laterality: Left;  CDE: 1.81   CESAREAN SECTION     CHOLECYSTECTOMY     Closure of abdominal wound/wound vac     LEFT HEART CATHETERIZATION WITH CORONARY ANGIOGRAM N/A  03/13/2011   Procedure: LEFT HEART CATHETERIZATION WITH CORONARY ANGIOGRAM;  Surgeon: [name], MD;  Location: [facility] CATH LAB;  Service: Cardiovascular;  Laterality: N/A;   PERCUTANEOUS CORONARY STENT INTERVENTION (PCI-S) N/A 03/18/2011   Procedure: PERCUTANEOUS CORONARY STENT INTERVENTION (PCI-S);  Surgeon: [name], MD;  Location: [facility] CATH LAB;  Service: Cardiovascular;  Laterality: N/A;   SMALL INTESTINE SURGERY  12/2007   Dr [name]   TONSILLECTOMY      Current Outpatient Medications  Medication Sig Dispense Refill   albuterol (PROVENTIL) (2.5 MG/3ML) 0.083% nebulizer solution Take 2.5 mg by nebulization every 6 (six) hours as needed. For wheezing. Combines with atrovent     ALPRAZolam (XANAX) 1 MG tablet Take 1 mg by mouth at bedtime as needed for anxiety or sleep.      amLODipine (NORVASC) 10 MG tablet Take 10 mg by mouth daily.     aspirin EC 81 MG tablet Take 81 mg by mouth daily.     BREO ELLIPTA 100-25 MCG/INH AEPB INHALE 1 PUFF INTO THE LUNGS DAILY  5   carvedilol (COREG) 6.25 MG tablet TAKE 1 TABLET BY MOUTH 2 TIMES DAILY WITH A MEAL. 180 tablet 3   clopidogrel (PLAVIX) 75 MG tablet TAKE 1 TABLET BY MOUTH EVERY DAY 90 tablet 3   dapagliflozin propanediol (FARXIGA) 10 MG TABS tablet Take 10 mg by mouth daily.     ezetimibe (ZETIA) 10 MG  tablet Take 10 mg by mouth daily.     famotidine (PEPCID) 40 MG tablet SMARTSIG:1 Tablet(s) By Mouth Every Evening     Ferrous Sulfate (IRON) 325 (65 FE) MG TABS Take 1 tablet by mouth daily after breakfast.      glipiZIDE (GLUCOTROL) 10 MG tablet Take 10 mg by mouth 2 (two) times daily before a meal.     glucose blood test strip 1 each by Other route as needed for other. Use as instructed one touch ultra     levocetirizine (XYZAL) 5 MG tablet Take 5 mg by mouth daily.     levothyroxine (SYNTHROID, LEVOTHROID) 150 MCG tablet Take 1 tablet (150 mcg total) by mouth every morning. 30 tablet 1   meclizine (ANTIVERT) 25 MG tablet Take 25 mg by  mouth 2 (two) times daily.      metFORMIN (GLUCOPHAGE) 500 MG tablet 500 mg. Take 1 Tablet in the AM and take 1/2 Tablet in the PM  3   mupirocin ointment (BACTROBAN) 2 % Apply 1 application topically 4 (four) times daily as needed.     nitroGLYCERIN (NITROSTAT) 0.4 MG SL tablet PLACE 1 TABLET UNDER TONGUE EVERY 5 MIN AS NEEDED FOR CHEST PAIN (UP TO 3 DOSES) 25 tablet 4   pantoprazole (PROTONIX) 40 MG tablet Take 40 mg by mouth daily.     rosuvastatin (CRESTOR) 40 MG tablet TAKE 1 TABLET BY MOUTH EVERY DAY AT NIGHT  3   telmisartan (MICARDIS) 80 MG tablet Take 80 mg by mouth daily.  3   traMADol (ULTRAM) 50 MG tablet Take 50 mg by mouth 2 (two) times daily as needed.     triamterene-hydrochlorothiazide (DYAZIDE) 37.5-25 MG capsule Take 1 capsule by mouth daily.      Vitamin D, Ergocalciferol, 50 MCG (2000 UT) CAPS Take by mouth.     montelukast (SINGULAIR) 10 MG tablet Take 10 mg by mouth daily.     No current facility-administered medications for this visit.   Allergies:  Codeine, Penicillins, and Sulfa antibiotics   ROS: No palpitations or syncope.  Physical Exam: VS:  BP 128/66   Pulse 80   Ht '5\' 1"'$  (1.549 m)   SpO2 98%   BMI 55.89 kg/m , BMI Body mass index is 55.89 kg/m.  Wt Readings from Last 3 Encounters:  07/09/20 295 lb 12.8 oz (134.2 kg)  12/01/19 278 lb (126.1 kg)  11/28/18 284 lb (128.8 kg)    General: Patient appears comfortable at rest. HEENT: Conjunctiva and lids normal. Neck: Supple, no elevated JVP or carotid bruits, no thyromegaly. Lungs: Clear to auscultation, nonlabored breathing at rest. Cardiac: Regular rate and rhythm, no S3 or significant systolic murmur, no pericardial rub. Extremities: Mild ankle edema.  ECG:  An ECG dated 07/09/2020 was personally reviewed today and demonstrated:  Sinus rhythm.  Recent Labwork:  September 2022: Hemoglobin 13.0, platelets 264, BUN 34, creatinine 1.43, potassium 4.5, AST 18, ALT 21, cholesterol 146, triglycerides  105, HDL 48, LDL 79, hemoglobin A1c 7.1%  Other Studies Reviewed Today:  No interval cardiac testing for review today.  Assessment and Plan:  1.  CAD status post DES to the left main in 2013 with medically managed circumflex disease.  She reports no active angina with low-level activity.  Remains on aspirin and Plavix without reported bleeding problems.  Continue Coreg, Norvasc, Micardis, Crestor, Zetia, and [place].  2.  Essential hypertension on multimodal therapy.  No changes made to current regimen with reasonable blood pressure control noted today.  3.  Mixed hyperlipidemia, continuing on Crestor and Zetia with overall good control and followed by Dr. [name].  Medication Adjustments/Labs and Tests Ordered: Current medicines are reviewed at length with the patient today.  Concerns regarding medicines are outlined above.   Tests Ordered: No orders of the defined types were placed in this encounter.   Medication Changes: No orders of the defined types were placed in this encounter.   Disposition:  Follow up  6 months.  Signed, Satira Sark, MD, [facility] 06/16/2021 1:30 PM    [facility] at [facility] [address] [place], [place] [address] Phone: [phone]; Fax: [phone]

## 2021-06-16 NOTE — Addendum Note (Signed)
Addended by: Berlinda Last on: 06/16/2021 03:19 PM   Modules accepted: Orders

## 2021-07-08 ENCOUNTER — Other Ambulatory Visit: Payer: Self-pay | Admitting: Cardiology

## 2021-10-09 DIAGNOSIS — Z23 Encounter for immunization: Secondary | ICD-10-CM | POA: Diagnosis not present

## 2021-10-09 DIAGNOSIS — N1832 Chronic kidney disease, stage 3b: Secondary | ICD-10-CM | POA: Diagnosis not present

## 2021-10-09 DIAGNOSIS — I2510 Atherosclerotic heart disease of native coronary artery without angina pectoris: Secondary | ICD-10-CM | POA: Diagnosis not present

## 2021-10-09 DIAGNOSIS — K219 Gastro-esophageal reflux disease without esophagitis: Secondary | ICD-10-CM | POA: Diagnosis not present

## 2021-10-09 DIAGNOSIS — R7301 Impaired fasting glucose: Secondary | ICD-10-CM | POA: Diagnosis not present

## 2021-10-09 DIAGNOSIS — M545 Low back pain, unspecified: Secondary | ICD-10-CM | POA: Diagnosis not present

## 2021-10-09 DIAGNOSIS — E039 Hypothyroidism, unspecified: Secondary | ICD-10-CM | POA: Diagnosis not present

## 2021-10-09 DIAGNOSIS — J4520 Mild intermittent asthma, uncomplicated: Secondary | ICD-10-CM | POA: Diagnosis not present

## 2021-10-09 DIAGNOSIS — R809 Proteinuria, unspecified: Secondary | ICD-10-CM | POA: Diagnosis not present

## 2021-10-09 DIAGNOSIS — I129 Hypertensive chronic kidney disease with stage 1 through stage 4 chronic kidney disease, or unspecified chronic kidney disease: Secondary | ICD-10-CM | POA: Diagnosis not present

## 2021-10-09 DIAGNOSIS — E1122 Type 2 diabetes mellitus with diabetic chronic kidney disease: Secondary | ICD-10-CM | POA: Diagnosis not present

## 2021-10-09 DIAGNOSIS — E782 Mixed hyperlipidemia: Secondary | ICD-10-CM | POA: Diagnosis not present

## 2021-11-04 DIAGNOSIS — Z0000 Encounter for general adult medical examination without abnormal findings: Secondary | ICD-10-CM | POA: Diagnosis not present

## 2021-12-22 ENCOUNTER — Telehealth: Payer: Self-pay | Admitting: Cardiology

## 2021-12-22 NOTE — Telephone Encounter (Signed)
Patient is requesting to convert 11/30 appointment with Dr. Domenic Polite to a virtual if at all possible. She states she requires assistance with walking and her legs have been bothering her lately. Please advise.

## 2021-12-22 NOTE — Telephone Encounter (Signed)
Please advise if this is okay.  Thank you

## 2021-12-22 NOTE — Telephone Encounter (Signed)
Patient notified and verbalized agreement.

## 2021-12-25 ENCOUNTER — Telehealth: Payer: Self-pay

## 2021-12-25 ENCOUNTER — Ambulatory Visit: Payer: No Typology Code available for payment source | Attending: Cardiology | Admitting: Cardiology

## 2021-12-25 ENCOUNTER — Encounter: Payer: Self-pay | Admitting: Cardiology

## 2021-12-25 VITALS — BP 127/68 | HR 78 | Ht 61.0 in

## 2021-12-25 DIAGNOSIS — E782 Mixed hyperlipidemia: Secondary | ICD-10-CM | POA: Diagnosis not present

## 2021-12-25 DIAGNOSIS — L98499 Non-pressure chronic ulcer of skin of other sites with unspecified severity: Secondary | ICD-10-CM | POA: Diagnosis not present

## 2021-12-25 DIAGNOSIS — I10 Essential (primary) hypertension: Secondary | ICD-10-CM

## 2021-12-25 DIAGNOSIS — N1832 Chronic kidney disease, stage 3b: Secondary | ICD-10-CM | POA: Diagnosis not present

## 2021-12-25 DIAGNOSIS — I25119 Atherosclerotic heart disease of native coronary artery with unspecified angina pectoris: Secondary | ICD-10-CM | POA: Diagnosis not present

## 2021-12-25 DIAGNOSIS — Z008 Encounter for other general examination: Secondary | ICD-10-CM | POA: Diagnosis not present

## 2021-12-25 NOTE — Progress Notes (Signed)
Virtual Visit via Telephone Note   Because of [name]'s co-morbid illnesses, she is at least at moderate risk for complications without adequate follow up.  This format is felt to be most appropriate for this patient at this time.  The patient did not have access to video technology/had technical difficulties with video requiring transitioning to audio format only (telephone).  All issues noted in this document were discussed and addressed.  No physical exam could be performed with this format.  Please refer to the patient's chart for her consent to telehealth for [name].    Date:  12/25/2021   ID:  [name], DOB 1950/03/16, MRN [id number] The patient was identified using 2 identifiers.  Patient Location: Home Provider Location: Office/Clinic   PCP:  [name], [place] Providers Cardiologist:  [name], MD {  Evaluation Performed:  Follow-Up Visit  Chief Complaint: Cardiac follow-up  History of Present Illness:    [name] is a 71 y.o. female last seen in May.  We spoke by phone today.  She states that she has had difficulty getting out of the house regularly due to worsening arthritis affecting her legs and arms.  Her son lives with her and helps with ADLs and other chores.  We did go over her medications today which are stable from a cardiac perspective.  Her blood pressure and heart rate are well controlled today.  She states that leg swelling has been adequately controlled.  I reviewed her lab work from September as well.  LDL was 83 at that time.  She continues to follow with Dr. [name].   Past Medical History:  Diagnosis Date   COPD (chronic obstructive pulmonary disease) ([place])    Coronary atherosclerosis of native coronary artery    Multivessel, DES distal left main, unsuccessful PCI circumflex 2/13, LVEF 50-55%   Essential hypertension    Hiatal hernia    Hypothyroidism    Mixed  hyperlipidemia    NSTEMI (non-ST elevated myocardial infarction) ([place])    Ovarian cancer (HCC)    Overweight(278.02)    Pulmonary embolism (HCC)    Recurrent, previously on Coumadin   Type 2 diabetes mellitus ([place])    Past Surgical History:  Procedure Laterality Date   ABDOMINAL SURGERY     APPENDECTOMY     BREAST LUMPECTOMY     CATARACT EXTRACTION W/PHACO  01/29/2011   Procedure: CATARACT EXTRACTION PHACO AND INTRAOCULAR LENS PLACEMENT ([place]);  Surgeon: [name];  Location: [facility];  Service: Ophthalmology;  Laterality: Left;  CDE: 1.81   CESAREAN SECTION     CHOLECYSTECTOMY     Closure of abdominal wound/wound vac     LEFT HEART CATHETERIZATION WITH CORONARY ANGIOGRAM N/A 03/13/2011   Procedure: LEFT HEART CATHETERIZATION WITH CORONARY ANGIOGRAM;  Surgeon: [name], MD;  Location: [facility] CATH LAB;  Service: Cardiovascular;  Laterality: N/A;   PERCUTANEOUS CORONARY STENT INTERVENTION (PCI-S) N/A 03/18/2011   Procedure: PERCUTANEOUS CORONARY STENT INTERVENTION (PCI-S);  Surgeon: [name], MD;  Location: [facility] CATH LAB;  Service: Cardiovascular;  Laterality: N/A;   SMALL INTESTINE SURGERY  12/2007   Dr [name]   TONSILLECTOMY       Current Meds  Medication Sig   albuterol (PROVENTIL) (2.5 MG/3ML) 0.083% nebulizer solution Take 2.5 mg by nebulization every 6 (six) hours as needed. For wheezing. Combines with atrovent   ALPRAZolam (XANAX) 1 MG tablet Take 1 mg by mouth at bedtime as needed for  anxiety or sleep.    amLODipine (NORVASC) 10 MG tablet Take 10 mg by mouth daily.   aspirin EC 81 MG tablet Take 81 mg by mouth daily.   BREO ELLIPTA 100-25 MCG/INH AEPB INHALE 1 PUFF INTO THE LUNGS DAILY   carvedilol (COREG) 6.25 MG tablet TAKE 1 TABLET BY MOUTH 2 TIMES DAILY WITH A MEAL.   clopidogrel (PLAVIX) 75 MG tablet TAKE 1 TABLET BY MOUTH EVERY DAY   dapagliflozin propanediol (FARXIGA) 10 MG TABS tablet Take 10 mg by mouth daily.   ezetimibe (ZETIA) 10 MG tablet Take 10 mg by mouth  daily.   famotidine (PEPCID) 40 MG tablet SMARTSIG:1 Tablet(s) By Mouth Every Evening   Ferrous Sulfate (IRON) 325 (65 FE) MG TABS Take 1 tablet by mouth daily after breakfast.    glipiZIDE (GLUCOTROL) 10 MG tablet Take 10 mg by mouth 2 (two) times daily before a meal.   glucose blood test strip 1 each by Other route as needed for other. Use as instructed one touch ultra   levocetirizine (XYZAL) 5 MG tablet Take 5 mg by mouth daily.   levothyroxine (SYNTHROID, LEVOTHROID) 150 MCG tablet Take 1 tablet (150 mcg total) by mouth every morning.   meclizine (ANTIVERT) 25 MG tablet Take 25 mg by mouth 2 (two) times daily.    metFORMIN (GLUCOPHAGE) 500 MG tablet 500 mg. Take 1 Tablet in the AM and take 1/2 Tablet in the PM   montelukast (SINGULAIR) 10 MG tablet Take 10 mg by mouth daily.   mupirocin ointment (BACTROBAN) 2 % Apply 1 application topically 4 (four) times daily as needed.   nitroGLYCERIN (NITROSTAT) 0.4 MG SL tablet PLACE 1 TABLET UNDER TONGUE EVERY 5 MIN AS NEEDED FOR CHEST PAIN (UP TO 3 DOSES)   pantoprazole (PROTONIX) 40 MG tablet Take 40 mg by mouth daily.   rosuvastatin (CRESTOR) 40 MG tablet TAKE 1 TABLET BY MOUTH EVERY DAY AT NIGHT   telmisartan (MICARDIS) 80 MG tablet Take 80 mg by mouth daily.   triamterene-hydrochlorothiazide (DYAZIDE) 37.5-25 MG capsule Take 1 capsule by mouth daily.    Vitamin D, Ergocalciferol, 50 MCG (2000 UT) CAPS Take by mouth daily.     Allergies:   Codeine, Penicillins, and Sulfa antibiotics   ROS:   Please see the history of present illness.    All other systems reviewed and are negative.  Prior CV studies:   The following studies were reviewed today:  No interval cardiac testing for review today.  Labs/Other Tests and Data Reviewed:    EKG:  An ECG dated 06/16/2021 was personally reviewed today and demonstrated:  Sinus rhythm with low voltage in the precordial leads.  Recent Labs:  September 2023: Hemoglobin 12.6, platelets 336, BUN 39,  creatinine 1.49, potassium 4.7, AST 16, ALT 18, cholesterol 148, triglycerides 112, HDL 45, LDL 83, hemoglobin A1c 7%, TSH 1.07  Wt Readings from Last 3 Encounters:  07/09/20 295 lb 12.8 oz (134.2 kg)  12/01/19 278 lb (126.1 kg)  11/28/18 284 lb (128.8 kg)        Objective:    Vital Signs:  BP 127/68   Pulse 78   Ht '5\' 1"'$  (1.549 m)   BMI 55.89 kg/m    ASSESSMENT & PLAN:    1.  CAD status post DES to left main in 2013 with medically managed circumflex disease.  We have followed her on medical therapy over time, no active angina and clinically stable with low-level activity, she is limited by significant arthritis.  I did review her recent lab work.  Continue aspirin, Plavix, Farxiga, Zetia, Crestor, Micardis, and as needed nitroglycerin.  2.  Essential hypertension on multimodal therapy, blood pressure well controlled today.  3.  CKD stage IIIb, creatinine 1.49 by most recent lab work.  Potassium normal.  She continues to follow with Dr. [name].  4.  Mixed hyperlipidemia, on Crestor and Zetia.  LDL 83.     Time:   Today, I have spent 6 minutes with the patient with telehealth technology discussing the above problems.     Medication Adjustments/Labs and Tests Ordered: Current medicines are reviewed at length with the patient today.  Concerns regarding medicines are outlined above.   Tests Ordered: No orders of the defined types were placed in this encounter.   Medication Changes: No orders of the defined types were placed in this encounter.   Follow Up:  In Person  6 months.  Signed, [name], MD  12/25/2021 3:38 PM    [place]

## 2021-12-25 NOTE — Patient Instructions (Signed)
Medication Instructions:  Your physician recommends that you continue on your current medications as directed. Please refer to the Current Medication list given to you today.   Labwork: None today  Testing/Procedures: None today  Follow-Up: 6 months  Any Other Special Instructions Will Be Listed Below (If Applicable).  If you need a refill on your cardiac medications before your next appointment, please call your pharmacy.  

## 2021-12-25 NOTE — Telephone Encounter (Signed)
  Patient Consent for Virtual Visit        [name] has provided verbal consent on 12/25/2021 for a virtual visit (video or telephone).   CONSENT FOR VIRTUAL VISIT FOR:  [name]  By participating in this virtual visit I agree to the following:  I hereby voluntarily request, consent and authorize [facility] and its employed or contracted physicians, physician assistants, nurse practitioners or other licensed health care professionals (the Practitioner), to provide me with telemedicine health care services (the "Services") as deemed necessary by the treating Practitioner. I acknowledge and consent to receive the Services by the Practitioner via telemedicine. I understand that the telemedicine visit will involve communicating with the Practitioner through live audiovisual communication technology and the disclosure of certain medical information by electronic transmission. I acknowledge that I have been given the opportunity to request an in-person assessment or other available alternative prior to the telemedicine visit and am voluntarily participating in the telemedicine visit.  I understand that I have the right to withhold or withdraw my consent to the use of telemedicine in the course of my care at any time, without affecting my right to future care or treatment, and that the Practitioner or I may terminate the telemedicine visit at any time. I understand that I have the right to inspect all information obtained and/or recorded in the course of the telemedicine visit and may receive copies of available information for a reasonable fee.  I understand that some of the potential risks of receiving the Services via telemedicine include:  Delay or interruption in medical evaluation due to technological equipment failure or disruption; Information transmitted may not be sufficient (e.g. poor resolution of images) to allow for appropriate medical decision making by the  Practitioner; and/or  In rare instances, security protocols could fail, causing a breach of personal health information.  Furthermore, I acknowledge that it is my responsibility to provide information about my medical history, conditions and care that is complete and accurate to the best of my ability. I acknowledge that Practitioner's advice, recommendations, and/or decision may be based on factors not within their control, such as incomplete or inaccurate data provided by me or distortions of diagnostic images or specimens that may result from electronic transmissions. I understand that the practice of medicine is not an exact science and that Practitioner makes no warranties or guarantees regarding treatment outcomes. I acknowledge that a copy of this consent can be made available to me via my patient portal ([place]), or I can request a printed copy by calling the office of [facility].    I understand that my insurance will be billed for this visit.   I have read or had this consent read to me. I understand the contents of this consent, which adequately explains the benefits and risks of the Services being provided via telemedicine.  I have been provided ample opportunity to ask questions regarding this consent and the Services and have had my questions answered to my satisfaction. I give my informed consent for the services to be provided through the use of telemedicine in my medical care

## 2022-01-30 ENCOUNTER — Other Ambulatory Visit: Payer: Self-pay | Admitting: Cardiology

## 2022-02-19 DIAGNOSIS — J4520 Mild intermittent asthma, uncomplicated: Secondary | ICD-10-CM | POA: Diagnosis not present

## 2022-02-19 DIAGNOSIS — I2510 Atherosclerotic heart disease of native coronary artery without angina pectoris: Secondary | ICD-10-CM | POA: Diagnosis not present

## 2022-02-19 DIAGNOSIS — K219 Gastro-esophageal reflux disease without esophagitis: Secondary | ICD-10-CM | POA: Diagnosis not present

## 2022-02-19 DIAGNOSIS — N184 Chronic kidney disease, stage 4 (severe): Secondary | ICD-10-CM | POA: Diagnosis not present

## 2022-02-19 DIAGNOSIS — E1122 Type 2 diabetes mellitus with diabetic chronic kidney disease: Secondary | ICD-10-CM | POA: Diagnosis not present

## 2022-02-19 DIAGNOSIS — E782 Mixed hyperlipidemia: Secondary | ICD-10-CM | POA: Diagnosis not present

## 2022-02-19 DIAGNOSIS — I129 Hypertensive chronic kidney disease with stage 1 through stage 4 chronic kidney disease, or unspecified chronic kidney disease: Secondary | ICD-10-CM | POA: Diagnosis not present

## 2022-02-19 DIAGNOSIS — E039 Hypothyroidism, unspecified: Secondary | ICD-10-CM | POA: Diagnosis not present

## 2022-02-19 DIAGNOSIS — G4700 Insomnia, unspecified: Secondary | ICD-10-CM | POA: Diagnosis not present

## 2022-02-19 DIAGNOSIS — J309 Allergic rhinitis, unspecified: Secondary | ICD-10-CM | POA: Diagnosis not present

## 2022-02-19 DIAGNOSIS — H8113 Benign paroxysmal vertigo, bilateral: Secondary | ICD-10-CM | POA: Diagnosis not present

## 2022-02-19 DIAGNOSIS — R809 Proteinuria, unspecified: Secondary | ICD-10-CM | POA: Diagnosis not present

## 2022-06-30 ENCOUNTER — Ambulatory Visit: Payer: No Typology Code available for payment source | Admitting: Cardiology

## 2022-08-02 ENCOUNTER — Other Ambulatory Visit: Payer: Self-pay | Admitting: Cardiology

## 2022-08-05 NOTE — Progress Notes (Unsigned)
Cardiology Office Note    Date:  08/06/2022  ID:  [name], DOB July 13, 1950, MRN [id number] Cardiologist: Nona Dell, MD    History of Present Illness:    [name] is a 72 y.o. female with past medical history of CAD (s/p DES to LM in 2013 and known LCx disease with unsuccessful PCI in 2013, intermediate-risk NST in 01/2013), HTN, HLD, Hypothyroidism, Type 2 DM, history of PE and history of ovarian cancer who presents to the office today for 65-month follow-up.  She most recently had a telehealth visit with Dr. Diona Browner in 11/2021 and reported not leaving her house frequently due to worsening arthritis. She denied any specific anginal symptoms and was continued on her current cardiac medications with Amlodipine 10 mg daily, ASA 81 mg daily, Coreg 6.25 mg twice daily, Plavix 75 mg daily, Farxiga 10 mg daily, Zetia 10 mg daily, Telmisartan 80 mg daily and Triamterene-HCTZ 37.5-25 mg daily.  In talking with the patient and her son today, she reports things have overall been stable since her last office visit. She mostly sits in a chair throughout the day or in her bed but does ambulate to the restroom. Says today is the first time she has left her house since 01/2022. She is in a wheelchair today as she reports having difficulty ambulating given her significant arthritis. She denies any recent chest pain or dyspnea on exertion. No specific orthopnea, PND or pitting edema. She remains on ASA and Plavix given her history of left main stenting and denies any melena, hematochezia or hematuria.  Studies Reviewed:   EKG: EKG is ordered today and demonstrates:   EKG Interpretation Date/Time:  Thursday August 06 2022 15:06:36 EDT Ventricular Rate:  78 PR Interval:  174 QRS Duration:  76 QT Interval:  386 QTC Calculation: 440 R Axis:   22  Text Interpretation: Normal sinus rhythm No acute changes when compared to prior tracings. Confirmed by Randall An ([id number]) on  08/06/2022 3:33:34 PM       NST: 01/2013 IMPRESSION:  1.Abnormal Lexiscan myocardial perfusion imaging.   2. Large myocardial scar in the apex and mid to distal anteroseptal  wall with no reversible ischemia.   3. Moderate-sized area of ischemia in the lateral wall/Left  circumflex territory   4.  Abnormal pharmacologic stress EKG for ischemia   5.  Normal left ventricular ejection fraction.   6. Overall intermediate risk study for major cardiac events. There  is a large area of scar in the apex, a moderate-sized area of  myocardium at jeopardy in the lateral wall corresponding to known  left circumflex disease.    Physical Exam:   VS:  BP 130/70   Pulse 75   SpO2 97%   Patient declined to weigh today. Wt Readings from Last 3 Encounters:  07/09/20 295 lb 12.8 oz (134.2 kg)  12/01/19 278 lb (126.1 kg)  11/28/18 284 lb (128.8 kg)     GEN: Pleasant, obese female appearing in no acute distress.  Sitting in wheelchair. NECK: JVD difficult to assess secondary to body habitus; No carotid bruits CARDIAC: RRR, no murmurs, rubs, gallops RESPIRATORY:  Clear to auscultation without rales, wheezing or rhonchi  ABDOMEN: Appears non-distended. No obvious abdominal masses. EXTREMITIES: No clubbing or cyanosis. No pitting edema.  Distal pedal pulses are 2+ bilaterally.   Assessment and Plan:   1. CAD - She has a history of left main stenting in 2013 as outlined above. While her activity is limited given  significant arthritis, she denies any recent chest pain or dyspnea on exertion. While her last recorded weight was at 295 lbs, I suspect this is quite higher now and she declines to weigh today. Suspect the sensitivity of non-invasive screening options would be limited given her body habitus. Given no recent anginal symptoms, will continue with medical therapy at this time. She remains on ASA 81 mg daily, Plavix 75 mg daily, Coreg 6.25 mg twice daily, Zetia 10 mg daily and Crestor 40 mg  daily.  2. HTN - Blood pressure is well-controlled at 130/70 during today's visit.  Continue current medical therapy with Amlodipine 10 mg daily, Coreg 6.25 mg twice daily, Telmisartan 80 mg daily and Triamterene-HCTZ 37.5-25 mg daily.  3. HLD - FLP in 01/2022 showed total cholesterol 144, triglycerides 124, HDL 45 and LDL 77. She was recently restarted on Zetia 10 mg daily. Would continue this along with Crestor 40 mg daily. She is planning to have follow-up labs with her PCP in 08/2022.  4. Stage 3 CKD - Creatinine was at 1.39 when checked in 01/2022 which is close to her known baseline. Scheduled for follow-up labs in 08/2022. She was started on Farxiga 10 mg daily by her PCP given her CKD.  Signed, Ellsworth Lennox, PA-C

## 2022-08-06 ENCOUNTER — Ambulatory Visit: Payer: No Typology Code available for payment source | Attending: Cardiology | Admitting: Student

## 2022-08-06 ENCOUNTER — Encounter: Payer: Self-pay | Admitting: Student

## 2022-08-06 VITALS — BP 130/70 | HR 75

## 2022-08-06 DIAGNOSIS — I10 Essential (primary) hypertension: Secondary | ICD-10-CM

## 2022-08-06 DIAGNOSIS — N1832 Chronic kidney disease, stage 3b: Secondary | ICD-10-CM | POA: Diagnosis not present

## 2022-08-06 DIAGNOSIS — E785 Hyperlipidemia, unspecified: Secondary | ICD-10-CM

## 2022-08-06 DIAGNOSIS — I2510 Atherosclerotic heart disease of native coronary artery without angina pectoris: Secondary | ICD-10-CM | POA: Diagnosis not present

## 2022-08-06 NOTE — Patient Instructions (Signed)
Medication Instructions:  Your physician recommends that you continue on your current medications as directed. Please refer to the Current Medication list given to you today.  *If you need a refill on your cardiac medications before your next appointment, please call your pharmacy*   Lab Work: NONE   If you have labs (blood work) drawn today and your tests are completely normal, you will receive your results only by: MyChart Message (if you have MyChart) OR A paper copy in the mail If you have any lab test that is abnormal or we need to change your treatment, we will call you to review the results.   Testing/Procedures: NONE    Follow-Up: At [facility], you and your health needs are our priority.  As part of our continuing mission to provide you with exceptional heart care, we have created designated Provider Care Teams.  These Care Teams include your primary Cardiologist (physician) and Advanced Practice Providers (APPs -  Physician Assistants and Nurse Practitioners) who all work together to provide you with the care you need, when you need it.  We recommend signing up for the patient portal called "MyChart".  Sign up information is provided on this After Visit Summary.  MyChart is used to connect with patients for Virtual Visits (Telemedicine).  Patients are able to view lab/test results, encounter notes, upcoming appointments, etc.  Non-urgent messages can be sent to your provider as well.   To learn more about what you can do with MyChart, go to [email or web address].com.    Your next appointment:   6 month(s)  Provider:   You may see Samuel McDowell, MD or one of the following Advanced Practice Providers on your designated Care Team:   Brittany Strader, PA-C  Michele Lenze, PA-C     Other Instructions Thank you for choosing  HeartCare!    

## 2022-08-28 DIAGNOSIS — J4520 Mild intermittent asthma, uncomplicated: Secondary | ICD-10-CM | POA: Diagnosis not present

## 2022-08-28 DIAGNOSIS — K219 Gastro-esophageal reflux disease without esophagitis: Secondary | ICD-10-CM | POA: Diagnosis not present

## 2022-08-28 DIAGNOSIS — I129 Hypertensive chronic kidney disease with stage 1 through stage 4 chronic kidney disease, or unspecified chronic kidney disease: Secondary | ICD-10-CM | POA: Diagnosis not present

## 2022-08-28 DIAGNOSIS — I2510 Atherosclerotic heart disease of native coronary artery without angina pectoris: Secondary | ICD-10-CM | POA: Diagnosis not present

## 2022-08-28 DIAGNOSIS — M545 Low back pain, unspecified: Secondary | ICD-10-CM | POA: Diagnosis not present

## 2022-08-28 DIAGNOSIS — G894 Chronic pain syndrome: Secondary | ICD-10-CM | POA: Diagnosis not present

## 2022-08-28 DIAGNOSIS — E1122 Type 2 diabetes mellitus with diabetic chronic kidney disease: Secondary | ICD-10-CM | POA: Diagnosis not present

## 2022-08-28 DIAGNOSIS — E039 Hypothyroidism, unspecified: Secondary | ICD-10-CM | POA: Diagnosis not present

## 2022-08-28 DIAGNOSIS — E782 Mixed hyperlipidemia: Secondary | ICD-10-CM | POA: Diagnosis not present

## 2022-08-28 DIAGNOSIS — N1832 Chronic kidney disease, stage 3b: Secondary | ICD-10-CM | POA: Diagnosis not present

## 2022-08-28 DIAGNOSIS — R809 Proteinuria, unspecified: Secondary | ICD-10-CM | POA: Diagnosis not present

## 2022-10-06 ENCOUNTER — Telehealth: Payer: Self-pay | Admitting: Cardiology

## 2022-10-06 MED ORDER — NITROGLYCERIN 0.4 MG SL SUBL: SUBLINGUAL_TABLET | SUBLINGUAL | 4 refills | Status: AC

## 2022-10-06 NOTE — Telephone Encounter (Signed)
Refilled as requested  

## 2022-10-06 NOTE — Telephone Encounter (Signed)
*  STAT* If patient is at the pharmacy, call can be transferred to refill team.   1. Which medications need to be refilled? (please list name of each medication and dose if known) new prescription for Nitroglycerin   2. Would you like to learn more about the convenience, safety, & potential cost savings by using the [facility]?      3. Are you open to using the [facility] (Type [facility].   4. Which pharmacy/location (including street and city if local pharmacy) is medication to be sent to? [facility] [address], [place]   5. Do they need a 30 day or 90 day supply?

## 2022-11-10 DIAGNOSIS — Z23 Encounter for immunization: Secondary | ICD-10-CM | POA: Diagnosis not present

## 2022-11-11 ENCOUNTER — Other Ambulatory Visit: Payer: Self-pay | Admitting: Cardiology

## 2023-01-15 ENCOUNTER — Other Ambulatory Visit: Payer: Self-pay | Admitting: Cardiology

## 2023-02-16 ENCOUNTER — Telehealth: Payer: Self-pay

## 2023-02-16 ENCOUNTER — Telehealth: Payer: Self-pay | Admitting: Cardiology

## 2023-02-16 ENCOUNTER — Ambulatory Visit: Payer: No Typology Code available for payment source | Admitting: Cardiology

## 2023-02-16 NOTE — Telephone Encounter (Signed)
Pt has pending appt tomorrow and requesting cb to see if phone visit can be done instead due to weather and being in a wheelchair making it more difficult for her to get out

## 2023-02-16 NOTE — Telephone Encounter (Signed)
Ok per MD,changed to telephone visit

## 2023-02-16 NOTE — Telephone Encounter (Signed)
  Patient Consent for Virtual Visit        [name] has provided verbal consent on 02/16/2023 for a virtual visit (video or telephone).   CONSENT FOR VIRTUAL VISIT FOR:  [name]  By participating in this virtual visit I agree to the following:  I hereby voluntarily request, consent and authorize [facility] and its employed or contracted physicians, physician assistants, nurse practitioners or other licensed health care professionals (the Practitioner), to provide me with telemedicine health care services (the "Services") as deemed necessary by the treating Practitioner. I acknowledge and consent to receive the Services by the Practitioner via telemedicine. I understand that the telemedicine visit will involve communicating with the Practitioner through live audiovisual communication technology and the disclosure of certain medical information by electronic transmission. I acknowledge that I have been given the opportunity to request an in-person assessment or other available alternative prior to the telemedicine visit and am voluntarily participating in the telemedicine visit.  I understand that I have the right to withhold or withdraw my consent to the use of telemedicine in the course of my care at any time, without affecting my right to future care or treatment, and that the Practitioner or I may terminate the telemedicine visit at any time. I understand that I have the right to inspect all information obtained and/or recorded in the course of the telemedicine visit and may receive copies of available information for a reasonable fee.  I understand that some of the potential risks of receiving the Services via telemedicine include:  Delay or interruption in medical evaluation due to technological equipment failure or disruption; Information transmitted may not be sufficient (e.g. poor resolution of images) to allow for appropriate medical decision making by the  Practitioner; and/or  In rare instances, security protocols could fail, causing a breach of personal health information.  Furthermore, I acknowledge that it is my responsibility to provide information about my medical history, conditions and care that is complete and accurate to the best of my ability. I acknowledge that Practitioner's advice, recommendations, and/or decision may be based on factors not within their control, such as incomplete or inaccurate data provided by me or distortions of diagnostic images or specimens that may result from electronic transmissions. I understand that the practice of medicine is not an exact science and that Practitioner makes no warranties or guarantees regarding treatment outcomes. I acknowledge that a copy of this consent can be made available to me via my patient portal [facility] MyChart), or I can request a printed copy by calling the office of [facility].    I understand that my insurance will be billed for this visit.   I have read or had this consent read to me. I understand the contents of this consent, which adequately explains the benefits and risks of the Services being provided via telemedicine.  I have been provided ample opportunity to ask questions regarding this consent and the Services and have had my questions answered to my satisfaction. I give my informed consent for the services to be provided through the use of telemedicine in my medical care

## 2023-02-17 ENCOUNTER — Encounter: Payer: Self-pay | Admitting: Cardiology

## 2023-02-17 ENCOUNTER — Telehealth: Payer: Self-pay | Admitting: Cardiology

## 2023-02-17 ENCOUNTER — Ambulatory Visit: Payer: PPO | Attending: Cardiology | Admitting: Cardiology

## 2023-02-17 VITALS — BP 120/72 | HR 71 | Ht 60.0 in | Wt 295.0 lb

## 2023-02-17 DIAGNOSIS — I25119 Atherosclerotic heart disease of native coronary artery with unspecified angina pectoris: Secondary | ICD-10-CM

## 2023-02-17 NOTE — Patient Instructions (Signed)
Medication Instructions:  Your physician recommends that you continue on your current medications as directed. Please refer to the Current Medication list given to you today.   Labwork: None today   Testing/Procedures: None today   Follow-Up: 6 months with Dr.McDowell  Any Other Special Instructions Will Be Listed Below (If Applicable).  If you need a refill on your cardiac medications before your next appointment, please call your pharmacy.  

## 2023-02-17 NOTE — Progress Notes (Signed)
   Virtual Visit via Telephone Note   Because of [name]'s co-morbid illnesses, she is at least at moderate risk for complications without adequate follow up.  This format is felt to be most appropriate for this patient at this time.  The patient did not have access to video technology/had technical difficulties with video requiring transitioning to audio format only (telephone).  All issues noted in this document were discussed and addressed.  No physical exam could be performed with this format.  Please refer to the patient's chart for her consent to telehealth for [facility].    Date:  02/17/2023   ID:  INFANTOF LLERENA, DOB [birth year], MRN [id number] The patient was identified using 2 identifiers.  Patient Location: Home Provider Location: Office/Clinic  Evaluation Performed:  Follow-Up Visit  Chief Complaint:  Cardiac follow-up  History of Present Illness:    [name] is a 73 y.o. female last seen in July 2024 by Ms. Strader PA-C, I reviewed the note.  We spoke by phone today.  She states that she has not been getting out of the house much recently with very cold weather.  From a cardiac perspective, she does not report any chest pain or nitroglycerin use.  She uses a rollator at home.  Denies any falls.  No palpitations or syncope.  I reviewed her medications.  Current regimen includes aspirin, Norvasc, Coreg, Plavix, Farxiga, Zetia, Crestor, Micardis, Dyazide, and as needed nitroglycerin.  She states that she has been compliant with therapy.  Today's reported blood pressure at home is noted below.  I reviewed her lab work from PCP done back in August 2024.  She states that she has a visit to see Dr. Margo Aye in March.   Prior CV studies:    No interval cardiac testing for review today.  Labs/Other Tests and Data Reviewed:    EKG:  An ECG dated 08/06/2022 was personally reviewed today and demonstrated:  Sinus rhythm.  Recent Labs:  August  2024: Hemoglobin 12.2, platelets 294, BUN 46, creatinine 1.46, potassium 4.7, AST 17, ALT 22, cholesterol 141, triglycerides 147, HDL 45, LDL 71, hemoglobin A1c 6.6%, TSH 0.791  Wt Readings from Last 3 Encounters:  02/17/23 295 lb (133.8 kg)  07/09/20 295 lb 12.8 oz (134.2 kg)  12/01/19 278 lb (126.1 kg)        Objective:    Vital Signs:  BP 120/72   Pulse 71   Ht 5' (1.524 m)   Wt 295 lb (133.8 kg)   SpO2 98%   BMI 57.61 kg/m    ASSESSMENT & PLAN:    1.  CAD status post DES to left main in 2013 with unsuccessful circumflex PCI ultimately managed medically.  No active angina at this time.  Continue medical therapy and observation.  She is on aspirin, Plavix, Farxiga, Crestor, Zetia, and as needed nitroglycerin.  2.  Primary hypertension.  Today's reported blood pressure is well-controlled.  Continue Norvasc, Coreg, Micardis, and Dyazide.  She is following lab work with PCP.  3.  Mixed hyperlipidemia.  LDL 71 in August 2024.  4.  CKD stage IIIb.  Creatinine 1.46 in August 2024.   Time:   Today, I have spent 6 minutes with the patient with telehealth technology discussing the above problems.     Follow Up:  In Person  6 months.  Signed, Nona Dell, MD  02/17/2023 1:41 PM    [facility]

## 2023-02-17 NOTE — Telephone Encounter (Signed)
  Patient Consent for Virtual Visit        [name] has provided verbal consent on 02/17/2023 for a virtual visit (video or telephone).   CONSENT FOR VIRTUAL VISIT FOR:  [name]  By participating in this virtual visit I agree to the following:  I hereby voluntarily request, consent and authorize [facility] and its employed or contracted physicians, physician assistants, nurse practitioners or other licensed health care professionals (the Practitioner), to provide me with telemedicine health care services (the "Services") as deemed necessary by the treating Practitioner. I acknowledge and consent to receive the Services by the Practitioner via telemedicine. I understand that the telemedicine visit will involve communicating with the Practitioner through live audiovisual communication technology and the disclosure of certain medical information by electronic transmission. I acknowledge that I have been given the opportunity to request an in-person assessment or other available alternative prior to the telemedicine visit and am voluntarily participating in the telemedicine visit.  I understand that I have the right to withhold or withdraw my consent to the use of telemedicine in the course of my care at any time, without affecting my right to future care or treatment, and that the Practitioner or I may terminate the telemedicine visit at any time. I understand that I have the right to inspect all information obtained and/or recorded in the course of the telemedicine visit and may receive copies of available information for a reasonable fee.  I understand that some of the potential risks of receiving the Services via telemedicine include:  Delay or interruption in medical evaluation due to technological equipment failure or disruption; Information transmitted may not be sufficient (e.g. poor resolution of images) to allow for appropriate medical decision making by the  Practitioner; and/or  In rare instances, security protocols could fail, causing a breach of personal health information.  Furthermore, I acknowledge that it is my responsibility to provide information about my medical history, conditions and care that is complete and accurate to the best of my ability. I acknowledge that Practitioner's advice, recommendations, and/or decision may be based on factors not within their control, such as incomplete or inaccurate data provided by me or distortions of diagnostic images or specimens that may result from electronic transmissions. I understand that the practice of medicine is not an exact science and that Practitioner makes no warranties or guarantees regarding treatment outcomes. I acknowledge that a copy of this consent can be made available to me via my patient portal [facility] MyChart), or I can request a printed copy by calling the office of [facility].    I understand that my insurance will be billed for this visit.   I have read or had this consent read to me. I understand the contents of this consent, which adequately explains the benefits and risks of the Services being provided via telemedicine.  I have been provided ample opportunity to ask questions regarding this consent and the Services and have had my questions answered to my satisfaction. I give my informed consent for the services to be provided through the use of telemedicine in my medical care

## 2023-04-07 DIAGNOSIS — M545 Low back pain, unspecified: Secondary | ICD-10-CM | POA: Diagnosis not present

## 2023-04-07 DIAGNOSIS — E039 Hypothyroidism, unspecified: Secondary | ICD-10-CM | POA: Diagnosis not present

## 2023-04-07 DIAGNOSIS — E1122 Type 2 diabetes mellitus with diabetic chronic kidney disease: Secondary | ICD-10-CM | POA: Diagnosis not present

## 2023-04-07 DIAGNOSIS — K219 Gastro-esophageal reflux disease without esophagitis: Secondary | ICD-10-CM | POA: Diagnosis not present

## 2023-04-07 DIAGNOSIS — I129 Hypertensive chronic kidney disease with stage 1 through stage 4 chronic kidney disease, or unspecified chronic kidney disease: Secondary | ICD-10-CM | POA: Diagnosis not present

## 2023-04-07 DIAGNOSIS — Z0000 Encounter for general adult medical examination without abnormal findings: Secondary | ICD-10-CM | POA: Diagnosis not present

## 2023-04-07 DIAGNOSIS — N1832 Chronic kidney disease, stage 3b: Secondary | ICD-10-CM | POA: Diagnosis not present

## 2023-04-07 DIAGNOSIS — J4520 Mild intermittent asthma, uncomplicated: Secondary | ICD-10-CM | POA: Diagnosis not present

## 2023-04-07 DIAGNOSIS — I2510 Atherosclerotic heart disease of native coronary artery without angina pectoris: Secondary | ICD-10-CM | POA: Diagnosis not present

## 2023-04-07 DIAGNOSIS — E782 Mixed hyperlipidemia: Secondary | ICD-10-CM | POA: Diagnosis not present

## 2023-04-07 DIAGNOSIS — R809 Proteinuria, unspecified: Secondary | ICD-10-CM | POA: Diagnosis not present

## 2023-08-23 ENCOUNTER — Ambulatory Visit: Admitting: Cardiology

## 2023-08-24 ENCOUNTER — Other Ambulatory Visit: Payer: Self-pay | Admitting: Cardiology

## 2023-08-24 ENCOUNTER — Ambulatory Visit: Attending: Cardiology | Admitting: Cardiology

## 2023-08-24 ENCOUNTER — Encounter: Payer: Self-pay | Admitting: Cardiology

## 2023-08-24 VITALS — BP 128/60 | HR 85 | Ht 60.0 in

## 2023-08-24 DIAGNOSIS — I25119 Atherosclerotic heart disease of native coronary artery with unspecified angina pectoris: Secondary | ICD-10-CM | POA: Diagnosis not present

## 2023-08-24 DIAGNOSIS — E782 Mixed hyperlipidemia: Secondary | ICD-10-CM

## 2023-08-24 DIAGNOSIS — I10 Essential (primary) hypertension: Secondary | ICD-10-CM | POA: Diagnosis not present

## 2023-08-24 DIAGNOSIS — N1832 Chronic kidney disease, stage 3b: Secondary | ICD-10-CM

## 2023-08-24 NOTE — Progress Notes (Signed)
    Cardiology Office Note  Date: 08/24/2023   ID: [name], DOB 15-Apr-1950, MRN [id number]  History of Present Illness: [name] is a 73 y.o. female last assessed via telehealth encounter in January.  She is here today with her son for a follow-up visit.  She does not get out of the house very much, states that she is limited by significant arthritic pain, uses a rollator at home.  She does not report any angina or nitroglycerin  use in the interim.  Continues to follow with Dr. Shona for primary care.  I did review her lab work from March.  We went over her medications.  She reports no changes in cardiac regimen.  Blood pressure is well-controlled today.  I reviewed her ECG today which shows sinus rhythm with Q in lead III.  Physical Exam: VS:  BP 128/60 (BP Location: Left Wrist, Cuff Size: Normal)   Pulse 85   Ht 5' (1.524 m)   SpO2 95%   BMI 57.61 kg/m , BMI Body mass index is 57.61 kg/m.  Wt Readings from Last 3 Encounters:  02/17/23 295 lb (133.8 kg)  07/09/20 295 lb 12.8 oz (134.2 kg)  12/01/19 278 lb (126.1 kg)    General: Patient appears comfortable at rest.  In wheelchair. HEENT: Conjunctiva and lids normal. Neck: Supple, no elevated JVP or carotid bruits. Lungs: Clear to auscultation, nonlabored breathing at rest. Cardiac: Regular rate and rhythm, no S3 or significant systolic murmur, no pericardial rub.  ECG:  An ECG dated 08/06/2022 was personally reviewed today and demonstrated:  Sinus rhythm.  Labwork:  March 2025: TSH 0.47, hemoglobin 12.8, platelets 284, BUN 42, creatinine 1.52, GFR 36, potassium 4.9, AST 16, ALT 19, cholesterol 140, triglycerides 121, HDL 41, LDL 77, hemoglobin A1c 6.4%  Other Studies Reviewed Today:  No interval cardiac testing for review today.  Assessment and Plan:  1.  CAD status post DES to left main in 2013 with unsuccessful circumflex PCI ultimately managed medically.  No angina at low level activity.  ECG  reviewed.  Plan to continue medical therapy which now includes aspirin  81 mg daily, Plavix  75 mg daily, Farxiga 10 mg daily, Zetia 10 mg daily, and Crestor  40 mg daily.  She has as needed nitroglycerin  available as well.   2.  Primary hypertension.  Blood pressure well-controlled today.  Continue Norvasc  10 mg daily, Coreg  6.25 mg twice daily, and Micardis 80 mg daily.   3.  Mixed hyperlipidemia.  LDL 77 in March.  Continue Zetia 10 mg daily and Crestor  40 mg daily.   4.  CKD stage IIIb.  Creatinine 1.52 with GFR 36 in March.  Disposition:  Follow up 6 months.  Signed, Jayson JUDITHANN Sierras, M.D., F.A.C.C. [facility]

## 2023-08-24 NOTE — Patient Instructions (Signed)
 Medication Instructions:   Your physician recommends that you continue on your current medications as directed. Please refer to the Current Medication list given to you today.   Labwork: None today  Testing/Procedures: None today  Follow-Up: 6 months office or phone visit with Dr.McDowell  Any Other Special Instructions Will Be Listed Below (If Applicable).  If you need a refill on your cardiac medications before your next appointment, please call your pharmacy.

## 2023-08-25 NOTE — Telephone Encounter (Signed)
*  STAT* If patient is at the pharmacy, call can be transferred to refill team.   1. Which medications need to be refilled? (please list name of each medication and dose if known) Clopidogrel    d you like to learn more about the convenience, safety, & potential cost savings by using the [facility]?      3. Are you open to using the [facility] (Type [facility].  4. Which pharmacy/location (including street and city if local pharmacy) is medication to be sent to?[facility] [address] [place]   5. Do they need a 30 day or 90 day supply? 90 days and refills

## 2023-10-13 DIAGNOSIS — I2510 Atherosclerotic heart disease of native coronary artery without angina pectoris: Secondary | ICD-10-CM | POA: Diagnosis not present

## 2023-10-13 DIAGNOSIS — E1122 Type 2 diabetes mellitus with diabetic chronic kidney disease: Secondary | ICD-10-CM | POA: Diagnosis not present

## 2023-10-13 DIAGNOSIS — N1832 Chronic kidney disease, stage 3b: Secondary | ICD-10-CM | POA: Diagnosis not present

## 2023-10-13 DIAGNOSIS — Z23 Encounter for immunization: Secondary | ICD-10-CM | POA: Diagnosis not present

## 2023-10-13 DIAGNOSIS — K219 Gastro-esophageal reflux disease without esophagitis: Secondary | ICD-10-CM | POA: Diagnosis not present

## 2023-10-13 DIAGNOSIS — M545 Low back pain, unspecified: Secondary | ICD-10-CM | POA: Diagnosis not present

## 2023-10-13 DIAGNOSIS — E782 Mixed hyperlipidemia: Secondary | ICD-10-CM | POA: Diagnosis not present

## 2023-10-13 DIAGNOSIS — R809 Proteinuria, unspecified: Secondary | ICD-10-CM | POA: Diagnosis not present

## 2023-10-13 DIAGNOSIS — J4520 Mild intermittent asthma, uncomplicated: Secondary | ICD-10-CM | POA: Diagnosis not present

## 2023-10-13 DIAGNOSIS — I129 Hypertensive chronic kidney disease with stage 1 through stage 4 chronic kidney disease, or unspecified chronic kidney disease: Secondary | ICD-10-CM | POA: Diagnosis not present

## 2023-10-13 DIAGNOSIS — E039 Hypothyroidism, unspecified: Secondary | ICD-10-CM | POA: Diagnosis not present

## 2024-02-08 ENCOUNTER — Other Ambulatory Visit: Payer: Self-pay | Admitting: Cardiology

## 2024-03-01 ENCOUNTER — Encounter: Payer: Self-pay | Admitting: Cardiology

## 2024-03-01 ENCOUNTER — Telehealth: Payer: Self-pay

## 2024-03-01 ENCOUNTER — Ambulatory Visit: Admitting: Cardiology

## 2024-03-01 VITALS — BP 103/68 | HR 71

## 2024-03-01 DIAGNOSIS — E782 Mixed hyperlipidemia: Secondary | ICD-10-CM

## 2024-03-01 DIAGNOSIS — I25119 Atherosclerotic heart disease of native coronary artery with unspecified angina pectoris: Secondary | ICD-10-CM | POA: Diagnosis not present

## 2024-03-01 DIAGNOSIS — I10 Essential (primary) hypertension: Secondary | ICD-10-CM

## 2024-03-01 DIAGNOSIS — N1832 Chronic kidney disease, stage 3b: Secondary | ICD-10-CM | POA: Diagnosis not present

## 2024-03-01 NOTE — Progress Notes (Signed)
"  ° °  Virtual Visit via Telephone Note   Because of [name]'s co-morbid illnesses, she is at least at moderate risk for complications without adequate follow up.  This format is felt to be most appropriate for this patient at this time.  The patient did not have access to video technology/had technical difficulties with video requiring transitioning to audio format only (telephone).  All issues noted in this document were discussed and addressed.  No physical exam could be performed with this format.  Please refer to the patient's chart for her consent to telehealth for [facility].    Date:  03/01/2024   ID:  [name], DOB [birth year], MRN [id number] The patient was identified using 2 identifiers.  Patient Location: Home Provider Location: Office/Clinic  Evaluation Performed:  Follow-Up Visit  History of Present Illness:    [name] is a 74 y.o. female last seen in July 2025.  We spoke by phone today.  She tells me that she is not able to get out of her house consistently due to functional limitations, uses a rollator.  The recent winter weather has also presented a challenge.  She did see Dr. Shona in September of last year, I reviewed her lab work as noted below.  She does not report any chest pain or palpitations at low-level activity, no syncope.  No recent falls.  I went over her medications, no specific changes since last encounter.  Blood pressure low normal today.  She does not report any VS fluid retention or leg swelling.  Does not have a scale at home for weights.  Prior CV studies:    No interval cardiac testing for review today.  Labs/Other Tests and Data Reviewed:    EKG:  An ECG dated 08/24/2023 was personally reviewed today and demonstrated:  Sinus rhythm with Q-wave in lead III.  Recent Labs:  September 2025: TSH 2.11, hemoglobin 12.7, platelets 291, BUN 53, creatinine 1.74, GFR 30, potassium 5.1, AST 18, ALT 23, cholesterol  146, triglycerides 122, HDL 43, LDL 81  Wt Readings from Last 3 Encounters:  02/17/23 295 lb (133.8 kg)  07/09/20 295 lb 12.8 oz (134.2 kg)  12/01/19 278 lb (126.1 kg)        Objective:    Vital Signs:  BP 103/68   Pulse 71    ASSESSMENT & PLAN:    1.  CAD status post DES to left main in 2013 with unsuccessful circumflex PCI ultimately managed medically.  She does not describe any angina or interval nitroglycerin  use.  Plan to continue observation on medical therapy which includes aspirin  81 mg daily, Plavix  75 mg daily, Farxiga 10 mg daily, Zetia 10 mg daily, and Crestor  40 mg daily.  She has as needed nitroglycerin  available.   2.  Primary hypertension.  Today's home blood pressure low normal, she is asymptomatic.  Continue with current regimen including Micardis 80 mg daily, Norvasc  10 mg daily, and Coreg  6.25 mg twice daily.   3.  Mixed hyperlipidemia.  LDL 81 in September 2025.  Continue Zetia 10 mg daily and Crestor  40 mg daily.   4.  CKD stage IIIb.  Creatinine 1.74 with GFR 30 in September 25.  Time:   Today, I have spent 5 minutes with the patient with telehealth technology discussing the above problems.    Follow Up:  In Person 6 months.  Signed, Jayson Sierras, MD  03/01/2024 2:41 PM    [facility]  "

## 2024-03-01 NOTE — Patient Instructions (Signed)
 Medication Instructions:   Your physician recommends that you continue on your current medications as directed. Please refer to the Current Medication list given to you today.   Labwork: None today  Testing/Procedures: None today  Follow-Up: 6 months Dr.McDowell  Any Other Special Instructions Will Be Listed Below (If Applicable).  If you need a refill on your cardiac medications before your next appointment, please call your pharmacy.

## 2024-03-01 NOTE — Telephone Encounter (Signed)
"  °  Patient Consent for Virtual Visit     :[id number]}   [name] has provided verbal consent on 03/01/2024 for a virtual visit (video or telephone).   CONSENT FOR VIRTUAL VISIT FOR:  [name]  By participating in this virtual visit I agree to the following:  I hereby voluntarily request, consent and authorize [facility] and its employed or contracted physicians, physician assistants, nurse practitioners or other licensed health care professionals (the Practitioner), to provide me with telemedicine health care services (the Services) as deemed necessary by the treating Practitioner. I acknowledge and consent to receive the Services by the Practitioner via telemedicine. I understand that the telemedicine visit will involve communicating with the Practitioner through live audiovisual communication technology and the disclosure of certain medical information by electronic transmission. I acknowledge that I have been given the opportunity to request an in-person assessment or other available alternative prior to the telemedicine visit and am voluntarily participating in the telemedicine visit.  I understand that I have the right to withhold or withdraw my consent to the use of telemedicine in the course of my care at any time, without affecting my right to future care or treatment, and that the Practitioner or I may terminate the telemedicine visit at any time. I understand that I have the right to inspect all information obtained and/or recorded in the course of the telemedicine visit and may receive copies of available information for a reasonable fee.  I understand that some of the potential risks of receiving the Services via telemedicine include:  Delay or interruption in medical evaluation due to technological equipment failure or disruption; Information transmitted may not be sufficient (e.g. poor resolution of images) to allow for appropriate medical decision  making by the Practitioner; and/or  In rare instances, security protocols could fail, causing a breach of personal health information.  Furthermore, I acknowledge that it is my responsibility to provide information about my medical history, conditions and care that is complete and accurate to the best of my ability. I acknowledge that Practitioner's advice, recommendations, and/or decision may be based on factors not within their control, such as incomplete or inaccurate data provided by me or distortions of diagnostic images or specimens that may result from electronic transmissions. I understand that the practice of medicine is not an exact science and that Practitioner makes no warranties or guarantees regarding treatment outcomes. I acknowledge that a copy of this consent can be made available to me via my patient portal [facility] MyChart), or I can request a printed copy by calling the office of [facility].    I understand that my insurance will be billed for this visit.   I have read or had this consent read to me. I understand the contents of this consent, which adequately explains the benefits and risks of the Services being provided via telemedicine.  I have been provided ample opportunity to ask questions regarding this consent and the Services and have had my questions answered to my satisfaction. I give my informed consent for the services to be provided through the use of telemedicine in my medical care    "
